# Patient Record
Sex: Male | Born: 1940 | Race: Black or African American | Hispanic: No | Marital: Single | Smoking: Former smoker
Health system: Southern US, Community
[De-identification: ages and names within clinical notes are randomized; demographics above are authoritative.]

## PROBLEM LIST (undated history)

## (undated) DIAGNOSIS — C801 Malignant (primary) neoplasm, unspecified: Secondary | ICD-10-CM

## (undated) DIAGNOSIS — I5020 Unspecified systolic (congestive) heart failure: Secondary | ICD-10-CM

## (undated) DIAGNOSIS — K219 Gastro-esophageal reflux disease without esophagitis: Secondary | ICD-10-CM

## (undated) DIAGNOSIS — J449 Chronic obstructive pulmonary disease, unspecified: Secondary | ICD-10-CM

## (undated) DIAGNOSIS — N400 Enlarged prostate without lower urinary tract symptoms: Secondary | ICD-10-CM

## (undated) DIAGNOSIS — N184 Chronic kidney disease, stage 4 (severe): Secondary | ICD-10-CM

## (undated) HISTORY — PX: COLONOSCOPY: SHX174

## (undated) HISTORY — PX: ABDOMINAL SURGERY: SHX537

---

## 2005-02-14 ENCOUNTER — Ambulatory Visit: Payer: Self-pay | Admitting: Internal Medicine

## 2005-02-14 ENCOUNTER — Inpatient Hospital Stay (HOSPITAL_COMMUNITY): Admission: EM | Admit: 2005-02-14 | Discharge: 2005-03-03 | Payer: Self-pay | Admitting: Emergency Medicine

## 2005-02-20 ENCOUNTER — Encounter (INDEPENDENT_AMBULATORY_CARE_PROVIDER_SITE_OTHER): Payer: Self-pay | Admitting: Specialist

## 2005-02-21 ENCOUNTER — Encounter: Payer: Self-pay | Admitting: Cardiology

## 2005-02-25 ENCOUNTER — Encounter (INDEPENDENT_AMBULATORY_CARE_PROVIDER_SITE_OTHER): Payer: Self-pay | Admitting: Specialist

## 2005-02-28 ENCOUNTER — Ambulatory Visit: Payer: Self-pay | Admitting: Oncology

## 2005-07-18 ENCOUNTER — Inpatient Hospital Stay (HOSPITAL_COMMUNITY): Admission: EM | Admit: 2005-07-18 | Discharge: 2005-07-26 | Payer: Self-pay | Admitting: Emergency Medicine

## 2005-07-25 ENCOUNTER — Encounter (INDEPENDENT_AMBULATORY_CARE_PROVIDER_SITE_OTHER): Payer: Self-pay | Admitting: *Deleted

## 2006-10-07 ENCOUNTER — Ambulatory Visit: Payer: Self-pay | Admitting: Internal Medicine

## 2006-10-07 ENCOUNTER — Inpatient Hospital Stay (HOSPITAL_COMMUNITY): Admission: EM | Admit: 2006-10-07 | Discharge: 2006-10-17 | Payer: Self-pay | Admitting: Emergency Medicine

## 2006-10-07 ENCOUNTER — Ambulatory Visit: Payer: Self-pay | Admitting: Emergency Medicine

## 2006-10-14 ENCOUNTER — Encounter (INDEPENDENT_AMBULATORY_CARE_PROVIDER_SITE_OTHER): Payer: Self-pay | Admitting: Gastroenterology

## 2007-02-08 ENCOUNTER — Ambulatory Visit: Payer: Self-pay | Admitting: Family Medicine

## 2007-02-08 ENCOUNTER — Inpatient Hospital Stay (HOSPITAL_COMMUNITY): Admission: EM | Admit: 2007-02-08 | Discharge: 2007-02-14 | Payer: Self-pay | Admitting: Emergency Medicine

## 2007-02-17 ENCOUNTER — Ambulatory Visit: Payer: Self-pay | Admitting: Family Medicine

## 2007-02-17 DIAGNOSIS — N401 Enlarged prostate with lower urinary tract symptoms: Secondary | ICD-10-CM

## 2007-02-17 DIAGNOSIS — C189 Malignant neoplasm of colon, unspecified: Secondary | ICD-10-CM | POA: Insufficient documentation

## 2007-02-17 DIAGNOSIS — J449 Chronic obstructive pulmonary disease, unspecified: Secondary | ICD-10-CM

## 2007-02-17 DIAGNOSIS — N12 Tubulo-interstitial nephritis, not specified as acute or chronic: Secondary | ICD-10-CM | POA: Insufficient documentation

## 2007-07-20 ENCOUNTER — Ambulatory Visit (HOSPITAL_BASED_OUTPATIENT_CLINIC_OR_DEPARTMENT_OTHER): Admission: RE | Admit: 2007-07-20 | Discharge: 2007-07-20 | Payer: Self-pay | Admitting: Urology

## 2008-03-29 ENCOUNTER — Ambulatory Visit: Payer: Self-pay | Admitting: Family Medicine

## 2009-04-10 ENCOUNTER — Telehealth: Payer: Self-pay | Admitting: *Deleted

## 2010-01-23 ENCOUNTER — Encounter: Payer: Self-pay | Admitting: Family Medicine

## 2010-01-23 DIAGNOSIS — N1330 Unspecified hydronephrosis: Secondary | ICD-10-CM | POA: Insufficient documentation

## 2010-04-16 NOTE — Progress Notes (Signed)
Summary: refill  Phone Note Refill Request Call back at [phone] Message from:  Patient  Refills Requested: Medication #1:  OMEPRAZOLE 40 MG  CPDR 1 by mouth once daily. [facility]  Initial call taken by: De Nurse,  April 10, 2009 11:03 AM  Follow-up for Phone Call       Follow-up by: Golden Circle RN,  April 10, 2009 11:18 AM    Prescriptions: OMEPRAZOLE 40 MG  CPDR (OMEPRAZOLE) 1 by mouth once daily  #30 x 12   Entered by:   Golden Circle RN   Authorized by:   Denny Levy MD   Signed by:   Golden Circle RN on 04/10/2009   Method used:   Electronically to        [facility] (retail)       [address]       [place], [place]  [id number]       Ph: [phone]       Fax: [phone]   RxID:   [id number]

## 2010-04-16 NOTE — Miscellaneous (Signed)
  Clinical Lists Changes  Problems: Removed problem of HYDROURETER/ HYDRONEPHROSIS (ICD-239.4) Added new problem of HYDRONEPHROSIS (ICD-591)

## 2010-06-26 ENCOUNTER — Ambulatory Visit: Payer: Self-pay | Admitting: Family Medicine

## 2010-07-30 NOTE — Consult Note (Signed)
[name], [name] NO.:  [id number]   MEDICAL RECORD NO.:  [id number]          PATIENT TYPE:  INP   LOCATION:  5012                         FACILITY:  [facility]   PHYSICIAN:  Graylin Shiver, M.D.   DATE OF BIRTH:  [birth year]   DATE OF CONSULTATION:  10/12/2006  DATE OF DISCHARGE:                                 CONSULTATION   REASON FOR CONSULTATION:  The patient is a 70 year old male who was  admitted to the hospital because of urosepsis.  He has been on  antibiotics and is improving from that.   We are consulted from a GI standpoint by Dr. Concepcion Elk because the patient  has a history of colon cancer which was diagnosed by Dr. Dorena Cookey in  2006.  The patient subsequently underwent a right hemicolectomy by Dr.  Daphine Deutscher in December 2006 and did not require chemotherapy.  According to  the patient, he has not had a follow-up colonoscopy.  When I checked  with our office, he was scheduled for a follow-up but apparently no  showed, and therefore he has not had a follow-up colonoscopy as of yet.  Dr. Concepcion Elk has requested consultation for consideration of follow up at  this time.  The patient is also anemic with a hemoglobin of 11.1 and  hematocrit of 33.8.  The patient denies seeing any blood in the stool.   The patient was in the hospital back in May 2007 at which time he was  endoscoped by Dr. Madilyn Fireman, and he was found to have severe esophagitis.   PAST MEDICAL HISTORY:  1. Ureteral stricture.  2. Benign prostatic hypertrophy.  3. COPD.  4. Gastroesophageal reflux.  5. Renal insufficiency.  6. Iron deficiency anemia.  7. Coronary artery disease.   SURGICAL HISTORY:  1. Right hemicolectomy.  2. Cystoscopy.   ALLERGIES:  None known.   SOCIAL HABITS:  She does not smoke or drink alcohol at this time.   On review of his hospital record from this hospitalization, there was  some initial question of cholecystitis based on CT scan, however this  was not  confirmed by HIDA scan.  Surgery has to follow the patient.   PHYSICAL EXAMINATION:  VITAL SIGNS:  Temperature 97.1, pulse 105, blood  pressure 128/84.  He is in no distress, nonicteric.  NECK:  Supple.  HEART:  Regular rhythm.  No murmurs.  Lungs were clear.  ABDOMEN:  Soft, nontender, no obvious hepatosplenomegaly.   IMPRESSION:  From a GI standpoint at this point, we are asked to see  this patient in regards to follow-up colonoscopy in view of his prior  history of colon cancer.   PLAN:  I will schedule the patient for colonoscopy in a couple of days  after he is prepped.           ______________________________  Graylin Shiver, M.D.     SFG/MEDQ  D:  10/12/2006  T:  10/13/2006  Job:  [id number]   cc:   Fleet Contras, M.D.  John C. Madilyn Fireman, M.D.

## 2010-07-30 NOTE — Consult Note (Signed)
[name], [name] NO.:  [id number]   MEDICAL RECORD NO.:  [id number]          PATIENT TYPE:  INP   LOCATION:  2114                         FACILITY:  [facility]   PHYSICIAN:  Lindaann Slough, M.D.  DATE OF BIRTH:  1940/04/19   DATE OF CONSULTATION:  10/08/2006  DATE OF DISCHARGE:                                 CONSULTATION   REASON FOR CONSULTATION:  Urosepsis.   The patient is a 70 year old male with a history of colon cancer,  treated with colectomy in 2006.  He also has an indwelling Foley  catheter for urethral stricture.  The patient's catheter was last  replaced in May 2008.  He has missed several appointments for catheter  exchange.  He had been having increasing fatigue.  He has been having  vomiting and diarrhea on and off for about 2-3 weeks.  His symptoms  became progressively worse, and during the past 2 days he has been  unable to keep anything down.  He came to the emergency room and was  hypotensive and had fever and chills.  He is known to have bilateral  hydronephrosis, right worse than left.  Renal ultrasound shows bilateral  hydronephrosis, right worse than left.  Creatinine was 7.7 on October 07, 2006.  The Foley catheter was replaced, and urinary output has since  increased.  Repeat creatinine today at 2:00 was 5.92.  Abdominal  ultrasound showed a stone at the gallbladder neck.  CT scan showed acute  cholecystitis.   PAST MEDICAL HISTORY:  1. Colon cancer.  2. He is known to have a urethral stricture, with bilateral      hydronephrosis.  3. COPD.  4. Chronic renal insufficiency.   MEDICATIONS:  Albuterol, ferrous sulfate.   SOCIAL HISTORY:  He does not smoke or drink.   FAMILY HISTORY:  Noncontributory.   ALLERGIES:  NO KNOWN DRUG ALLERGIES.   REVIEW OF SYSTEMS:  As noted in the HPI, and everything else negative.   PHYSICAL EXAMINATION:  GENERAL:  This is a cachectic 70 year old male.  He is alert and oriented.  VITAL SIGNS:   Blood pressure is he is 96/56, temperature 99.5, pulse  127, respirations 29.  ABDOMEN:  Soft.  It is tender in the right upper quadrant.  He has no  CVA tenderness.  Kidneys are not palpable.  He has no hepatomegaly, no  splenomegaly.  Bladder is not distended.  GU:  Penis is normal.  He has a Foley catheter that is draining mostly  clear urine at this time, but apparently purulent  when he first came  in.  Scrotal contents are within normal limits.  He has no inguinal  hernia, and he has bilateral nontender inguinal adenopathy.   Hemoglobin is 9.5, hematocrit 28.3, and WBC 42.9.  Creatinine is now  5.92, and BUN is 65.  Blood cultures showed gram-negative rods. As  previously noted, CT scan showed acute cholecystitis.   IMPRESSION:  1. Urethral stricture.  2. Sepsis.  3. Renal insufficiency.  4. Bilateral hydronephrosis.  5. Chronic renal failure.  6. History of colon cancer.  7.  Acute cholecystitis.   From a urologic standpoint, the patient will need to leave the Foley  catheter indwelling.  Will check BUN and creatinine.  Management of the  acute cholecystitis as per general surgery.      Lindaann Slough, M.D.  Electronically Signed     MN/MEDQ  D:  10/08/2006  T:  10/09/2006  Job:  [id number]

## 2010-07-30 NOTE — Op Note (Signed)
[name], ROSENDAHL NO.:  [id number]   MEDICAL RECORD NO.:  [id number]          PATIENT TYPE:  INP   LOCATION:  5012                         FACILITY:  [facility]   PHYSICIAN:  Graylin Shiver, M.D.   DATE OF BIRTH:  [birth year]   DATE OF PROCEDURE:  10/14/2006  DATE OF DISCHARGE:                               OPERATIVE REPORT   INDICATION FOR PROCEDURE:  History of colon cancer status post right  hemicolectomy in the past, heme-positive stool, anemia.   Informed consent was obtained after explanation of the risks of  bleeding, infection and perforation.   PREMEDICATION:  Fentanyl 75 mcg IV, Versed 7.5 mg IV.   PROCEDURE:  With the patient in the left lateral decubitus position, a  rectal exam was performed.  No masses were felt.  The Pentax pediatric  colonoscope was inserted into the rectum and advanced around the colon  to the anastomosis site.  The anastomosis site looked normal.  Just near  the anastomosis site there were 3 small mucosal polyps that were  biopsied off with cold forceps.  In the transverse colon there was a  small polyp biopsied off with cold forceps.  The descending colon,  sigmoid and rectum looked normal.  He tolerated the procedure well  without complications.   IMPRESSION:  Colon polyps.   PLAN:  I would recommend checking the biopsies.  I would recommend that  given his history that he should have a repeat colonoscopy again in 3  years.           ______________________________  Graylin Shiver, M.D.     SFG/MEDQ  D:  10/14/2006  T:  10/15/2006  Job:  [id number]   cc:   Fleet Contras, M.D.  John C. Madilyn Fireman, M.D.

## 2010-07-30 NOTE — H&P (Signed)
NAMEVERSHAWN, WESTRUP NO.:  [id number]   MEDICAL RECORD NO.:  [id number]          PATIENT TYPE:  INP   LOCATION:  6295                         FACILITY:  [facility]   PHYSICIAN:  Nestor Ramp, MD        DATE OF BIRTH:  04-12-1940   DATE OF ADMISSION:  02/08/2007  DATE OF DISCHARGE:                              HISTORY & PHYSICAL   PRIMARY CARE PHYSICIAN:  Fleet Contras, M.D.   UROLOGY:  Lindaann Slough, M.D.   GASTROENTEROLOGIST:  Everardo All. Madilyn Fireman, M.D.   HISTORY OF PRESENT ILLNESS:  This patient is a 70 year old male with  obstructive uropathy, a chronic cath, a history of colon cancer, and  COPD who presented to the ED with a 1-day history of progressive  malaise, decreased appetite, subjective fever and chills.  The patient  noticed a decrease in urine output about 3 days ago.  He has also has  had abdominal pain and suprapubic pain that were similar as with his  prior admission for urosepsis.  He also has had continuous vomiting  since last night.  The most recent episode had coffee-grounds emesis.  The patient took 650 mg of aspirin yesterday, but otherwise has no  chronic NSAID or alcohol use.  He denies syncope, diarrhea, chest pain,  os shortness of breath.  The patient's Foley was changed in the ED, and  he had foul-smelling purulent urine.  His urine is now clear.   PAST MEDICAL HISTORY:  1. Colon cancer status post hemicolectomy in 2006.  The patient had a      a colonoscopy in July 2008 which showed three polyps.  He did not      follow up with Dr. Madilyn Fireman.  2. BPH with a hydroureter and hydronephrosis.  3. COPD.  4. GERD.  5. Chronic renal insufficiency with a baseline creatinine of 1.9-2.0.   MEDICATIONS:  The patient really does not take any medicines  consistently.   ALLERGIES:  No known drug allergies.   SOCIAL HISTORY:  The patient lives in a boarding house.  He has no  family in the area, and seems to be estranged from them.  He quit  smoking  in 2006 after a 30-year history.  He uses occasional alcohol.  Last drink was 3 weeks ago.  He does not do any drugs.  He has trouble  affording his medicines.   FAMILY HISTORY:  The patient states everyone in his family died of  natural causes.   PHYSICAL EXAM:  VITAL SIGNS:  Temperature 97.5, pulse 111, BP 146/88,  respirations 24, O2 97% on room air.  GENERAL:  The patient is alert and oriented x3.  No acute distress.  HEENT:  Head is normocephalic and atraumatic.  He wears glasses.  Dry  mucous membranes.  NECK:  Supple.  No carotid bruits.  No JVD.  CARDIOVASCULAR:  He is tachycardic.  No murmurs, rubs or gallops.  LUNGS:  Clear to auscultation bilaterally.  ABDOMEN: Positive bowel sounds.  Mild suprapubic tenderness.  No CVA  tenderness.  EXTREMITIES:  No clubbing, cyanosis or edema.  NEURO EXAM:  Cranial nerves II-XII are grossly intact.   LABS AND STUDIES:  Abdominal chest x-ray showed no acute findings.  Gastroccult blood was positive.  UA showed large hemoglobin, greater  than 300 protein, negative nitrites, large LE, specific gravity 1.011.  Urine micro showed too numerous to count white blood cells, 11-20 red  blood cells, many bacteria.  Sodium 140, potassium 4.0, chloride 106,  bicarb 21, BUN 46, creatinine 4.6, glucose 135.  White count 24.3,  hemoglobin 17.3, and i-STAT 13.7 on regular CBC, platelets 312.   ASSESSMENT AND PLAN:  1. This is a 70 year old male with UTI/urosepsis and upper GI bleed.      The patient is currently hemodynamically stable except for mild      tachycardia.  He is afebrile but has a very elevated white count      given his history of urosepsis with similar presentation, last      September, we will start him on Zosyn and monitor him closely.  We      will start antibiotics when the culture is back.  If the patient is      improving we will also check blood cultures.  2. Upper gastrointestinal bleed.  The patient has had gastric occult       positive emesis.  He may have a small Mallory-Weiss tear from      repetitive vomiting.  If he continues to vomit he will need a GI      consult and likely EGD.  We will type and screen him for 2 units.      Hemoglobin currently normal.  Will check coags, give Protonix IV      b.i.d. and Zofran p.r.n. nausea.  3. Dehydration.  This is likely secondary to vomiting and poor intake.      Will bolus him 500 mL now and run maintenance IV fluids.  4. Acute on chronic renal failure.  This is likely post renal with his      history.  He also probably has a component of prerenal  from volume      depletion.  5. History of chronic obstructive pulmonary disease.  There is no      evidence of acute exacerbation.  Will monitor.  6. Prophylaxis.  He will be on SCDs for DVT prophylaxis.      Altamese Cabal, M.D.  Electronically Signed      Nestor Ramp, MD  Electronically Signed    KS/MEDQ  D:  02/08/2007  T:  02/09/2007  Job:  [id number]

## 2010-07-30 NOTE — Consult Note (Signed)
NAMEKEPLER, MCCABE NO.:  [id number]   MEDICAL RECORD NO.:  [id number]          PATIENT TYPE:  INP   LOCATION:  2114                         FACILITY:  [facility]   PHYSICIAN:  Ollen Gross. Vernell Morgans, M.D. DATE OF BIRTH:  29-May-1940   DATE OF CONSULTATION:  10/08/2006  DATE OF DISCHARGE:                                 CONSULTATION   REASON FOR CONSULTATION:  Mr. Starling is a 70 year old black male who  presented to the emergency department yesterday with complaints of  generalized malaise and fatigue and nausea, vomiting and diarrhea that  had been going on for about the last 3-4 weeks.   HISTORY OF PRESENT ILLNESS:  He felt that it started with some bad food  over the July 4th holiday, but has progressed since then.  He has had  difficulty passing his urine.  He initially did not complain of any  chest pain or abdominal pain.  He has not been able to keep food down  for quite some time.  He has felt a little feverish and chills.  He  arrived to the emergency department hypotensive and tachycardic and  required admission to the ICU and aggressive resuscitation with signs of  sepsis.  He, after arrival, was found to have bacteria in the urine with  hydronephrosis.  He has a white count of 20,000.  He also had a CT scan  that showed some thickening of the gallbladder wall with a gallstone, as  well as the hydronephrosis.  He was also found to be in renal failure  with a creatinine of 7.7.  He has been aggressively resuscitated and  seems to be gradually improving.  His only complaint now is of some mild  right upper quadrant epigastric pain.  His nausea is improving.   PAST MEDICAL HISTORY:  1. His past medical history is significant for colon cancer.  2. Urethral stricture.  3. BPH.  4. COPD.  5. Gastroesophageal reflux.  6. Chronic renal insufficiency.  7. Iron deficiency anemia.  8. Coronary artery disease.   PAST SURGICAL HISTORY:  1. Past surgical history is  significant for a colon resection in 2006.  2. Cystoscopy.   MEDICATIONS:  His medications include a multivitamin with iron; and  albuterol.   ALLERGIES:  No known drug allergies.   SOCIAL HISTORY:  He denies any current use of alcohol or tobacco  products.   FAMILY HISTORY:  Family history is noncontributory.   PHYSICAL EXAMINATION:  VITAL SIGNS:  On physical examination, his  temperature is currently 98.8, blood pressure is 111/54, pulse of 115,  O2 saturation 92%.  He is on norepinephrine and vasopressin.  GENERAL APPEARANCE:  In general, he is an elderly, thin black male,  lying in bed, in no acute distress.  SKIN:  The skin is warm and dry with no jaundice.  EYES:  Extraocular muscles are intact.  Pupils are equal, round and  reactive to light.  Sclerae are nonicteric.  LUNGS:  Lungs are clear bilaterally with no use of accessory respiratory  muscles.  HEART:  The heart has a  regular rate and rhythm with an impulse in the  left chest.  ABDOMEN:  The abdomen is soft, with some mild epigastric right upper  quadrant tenderness, but no guarding or peritonitis.  EXTREMITIES:  No clubbing, cyanosis or edema.  He has some general  weakness.  PSYCHOLOGICAL:  He is alert and oriented x3 with no evidence of anxiety  or depression.   LABORATORY INVESTIGATIONS:  On review of his lab work, it was  significant for a white count of 20,000.  His total bilirubin was  elevated at 3.2, but other liver functions were normal.  Creatinine was  down to 5.9 from 7.7.  CT scan, again, showed hydronephrosis and some  thickening of the gallbladder wall with a gallstone.  He had a troponin  of 1.   ASSESSMENT AND PLAN:  This is a 70 year old black male with what sounds  like a presentation of urosepsis.  It is certainly possible that he  could have some cholecystitis contributing to his current condition as  well, given the findings.   At this point I would recommend continued bowel rest and  board-spectrum  antibiotic therapy since he seems to be improving on this.  I would  recommend a urology consultation for his hydronephrosis.  I would also  recommend a cardiology evaluation, given his elevated troponin.  If this  is evidence of an MI, then he would be at extremely high risk of  complication with any type of surgery.  Given his current condition and  findings, I believe that the safest course of action with his  gallbladder would be to percutaneously drain it rather than try to  operate on him at this point in time, and we will continue to follow him  closely with you.      Ollen Gross. Vernell Morgans, M.D.  Electronically Signed     PST/MEDQ  D:  10/08/2006  T:  10/08/2006  Job:  [id number]

## 2010-07-30 NOTE — Discharge Summary (Signed)
[name], HUTCHINSON NO.:  [id number]   MEDICAL RECORD NO.:  [id number]          PATIENT TYPE:  INP   LOCATION:  6712                         FACILITY:  [facility]   PHYSICIAN:  Nestor Ramp, MD        DATE OF BIRTH:  [birth year]   DATE OF ADMISSION:  02/08/2007  DATE OF DISCHARGE:  02/13/2007                               DISCHARGE SUMMARY   PRIMARY CARE Zarina Pe:  The patient is to establish care at [facility].   PRIMARY UROLOGIST:  Lindaann Slough, M.D., [facility].   REASON FOR ADMISSION:  Oliguria, fatigue.   DISCHARGE DIAGNOSES:  1. Pyelonephritis.  2. History of colon cancer, status post hemicolectomy in 2006.  3. Benign prostatic hypertrophy with hydroureter and hydronephrosis.  4. Chronic obstructive pulmonary disease.  5. Gastroesophageal reflux disease.  6. Chronic renal insufficiency with baseline creatinine of 1.9 to 2.0.   DISCHARGE MEDICATIONS:  1. Bactrim Double Strength 1 tablet p.o. b.i.d. times 10 days.  2. Megace 800 mg p.o. daily.  3. Protonix 40 mg p.o. daily.  4. Centrum Silver multivitamin daily.  5. Iron sulfate 325 mg p.o. b.i.d.  6. Albuterol 2 puffs q.6h. p.r.n.   HOSPITAL COURSE:  The patient is a 70 year old male with a history of  obstructive uropathy and chronic Foley catheter who presented with a 1-  day history of progressive malaise, decreased appetite, subjective fever  and chills and a 3-day history of oliguria.   Problem #1.  Sepsis. Upon evaluation the patient was found to have a  white blood cell count of 24.3, tachycardia to 111.  Given his  urinalysis findings, the patient was started on Zosyn for sepsis  secondary to UTI/pyelonephritis.  The patient subsequently has  improvement in his white blood cell count and tachycardia prior to  discharge.  He was never hemodynamically unstable.  Had a relatively  uneventful hospital course.  Problem #2.  Pyelonephritis.  The patient has a history of  chronic  indwelling Foley secondary to both BPH and anatomical abnormalities.  He  had 1 similar episode of obstruction of his catheter and subsequent  development of sepsis and pyelonephritis in September.  When the patient  was evaluated in the ED and his Foley catheter was changed, it was  significant for gross pus that was expressed from his urethra.  The  patient was started on Zosyn while urine culture was pending.  Once  urine culture came back with Escherichia coli, pan sensitive, he was  transitioned to Bactrim, Double Strength 1 tablet p.o. b.i.d.  Given his  classification of a complicated pyelonephritis he completed a 2-week  course of antibiotics.  The patient since this is the second episode of  this happening, would strongly recommend that the patient discuss either  more frequent changing of his Foley catheter or other options with Dr.  Brunilda Payor, his urologist.  Problem #3.  Upper GI bleed.  When the patient presented he had episode  of emesis which was gastro-occult positive.  The likely etiology was  small Mallory-Weiss tear secondary to repetitive vomiting.  After he was  admitted to the floor he did not have any further episodes of vomiting  and his hemoglobin remained stable throughout the duration of his  hospital stay.  He will be discharged on Protonix 40 mg p.o. daily.  Problem #4.  Acute on-chronic renal failure. The patient had a baseline  renal creatinine of 1.922.  On evaluation in the ED he was found to have  a creatinine of 4.6.  This is likely secondary to post-renal cause with  obstruction with his blocked catheter.  His creatinine greatly improved  throughout the course of his hospital stay trending each day towards his  baseline.  This was after administration of IV fluids.  On the day prior  to discharge his creatinine was 2.37.   CONDITION AT DISCHARGE:  Stable.   Discharge labs white blood cell count 7.4, hemoglobin 10.9, platelets  194, urine culture  positive for Escherichia coli which was pan  sensitive, blood cultures times 2 were no growth to date.   DISPOSITION:  The patient is discharged to home.   DISCHARGE FOLLOWUP:  1. The patient is to follow up with Dr. Lindaann Slough of urology on      March 04, 2007, at 1 p.m.  2. The patient is follow up with Dr. Jennette Kettle at [facility] on February 17, 2007, 9:15 a.m.   FOLLOW-UP ISSUES:  1. Given 2 episodes of pyelonephritis secondary to blocked Foley      catheter, the patient may need new options.  2. Anemia.  3. Poor appetite.      Lauro Franklin, MD  Electronically Signed      Nestor Ramp, MD  Electronically Signed    TCB/MEDQ  D:  02/12/2007  T:  02/12/2007  Job:  [id number]

## 2010-07-30 NOTE — Op Note (Signed)
NAMENEIKO, TRIVEDI NO.:  [id number]   MEDICAL RECORD NO.:  [id number]           PATIENT TYPE:   LOCATION:                                 FACILITY:   PHYSICIAN:  Lindaann Slough, M.D.  DATE OF BIRTH:  12/03/1940   DATE OF PROCEDURE:  07/20/2007  DATE OF DISCHARGE:                               OPERATIVE REPORT   PREOPERATIVE DIAGNOSIS:  Urethral stricture.   POSTOPERATIVE DIAGNOSIS:  Urethral stricture.   PROCEDURE PERFORMED:  Cystoscopy, Lowsley insertion of suprapubic tube  via cystostomy.   SURGEON:  Danae Chen, M.D.   ASSISTANT:  Melina Schools   ANESTHESIA:  General LMA.   DRAINS:  22-French suprapubic tube.   INDICATIONS FOR PROCEDURE:  Mr. Cada is a 70 year old male with urethral  stricture.  He has a history of medical noncompliance.  He has failed  conservative management and has been on an indwelling urethral catheter.  He desires a suprapubic tube.   DESCRIPTION OF PROCEDURE:  The patient was brought to the operating  room.  He was identified by his armband.  Informed consent was obtained  and preoperative time-out was performed.  After the successful induction  of general endotracheal anesthesia, this patient was moved to the dorsal  lithotomy position where all appropriate pressure points were padded to  avoid neurapraxia and compartment syndrome.  The urethral catheter was  removed.  The perineum was prepped and draped.  The surgeons were gowned  and gloved.  The 22-French cystoscopic sheath was used to introduce the  30 degrees cystoscopic lens transurethrally.  The anterior urethra  showed an approximately 2 cm area of dense stricture that was opened  from the previous catheter.  Posterior urethra showed a TUR defect.  Upon entering the bladder it was small and contracted.  There were no  masses, foreign bodies, diverticula, stones.  The bladder was then  filled.  The cystoscope was removed.  The curved Lowsley retractor was  passed  blindly per urethra until it could be palpated on the anterior  abdominal wall within the bladder.  Scalpel was used to cut down onto  the retractor which was then brought out percutaneously.  A 22-French  Foley catheter was secured to the Lowsley with a silk stitch.  This was  then drawn in an antegrade fashion until the tip of the catheter was at  the urethral meatus.  The retractor was then removed.  The Foley  catheter was then retracted into the bladder under direct vision with  the cystoscope.  Once seen there the balloon was inflated.  We  visualized the balloon flush against the wall of the bladder.  The  cystoscope was removed.  A silk stitch was placed to hold the SP tube in  place.  At this time the procedure was terminated.  The patient  tolerated the procedure well and there were no  complications.  Su Grand was the attending primary responsible  physician who was present and participated in all aspects of the  procedure.   DISPOSITION:  To the PACU.     ______________________________  Melina Schools, M.D.      Lindaann Slough, M.D.  Electronically Signed    JR/MEDQ  D:  07/20/2007  T:  07/20/2007  Job:  [id number]

## 2010-07-30 NOTE — Discharge Summary (Signed)
[name], [name] NO.:  [id number]   MEDICAL RECORD NO.:  [id number]          PATIENT TYPE:  INP   LOCATION:  6712                         FACILITY:  [facility]   PHYSICIAN:  Nestor Ramp, MD        DATE OF BIRTH:  [birth year]   DATE OF ADMISSION:  02/08/2007  DATE OF DISCHARGE:  02/14/2007                               DISCHARGE SUMMARY   ADDENDUM:  The patient was kept overnight because he had developed  diarrhea which improved throughout the day, and his symptoms had  resolved by the next morning.  He is tolerating p.o. well and had no  further symptoms.   DISCHARGE MEDICATIONS:  Remain the same.   Followup plans remain the same.      Altamese Cabal, M.D.  Electronically Signed      Nestor Ramp, MD  Electronically Signed    KS/MEDQ  D:  02/14/2007  T:  02/14/2007  Job:  [id number]

## 2010-08-02 NOTE — Consult Note (Signed)
[name], [name] NO.:  [id number]   MEDICAL RECORD NO.:  [id number]          PATIENT TYPE:  INP   LOCATION:  5511                         FACILITY:  [facility]   PHYSICIAN:  Willa Rough, M.D.     DATE OF BIRTH:  [birth year]   DATE OF CONSULTATION:  DATE OF DISCHARGE:                                   CONSULTATION   [name] presented with dehydration and renal insufficiency.  He had urinary  problems and eventually was found to have a right hepatic colon mass.  He  needs surgery for this.  It was noted that he had an increased troponin when  he was admitted, and his EKG is abnormal.  Cardiology is asked to see him  for further evaluation.   The patient denies any cardiac symptoms.  He has not had chest pain or  significant shortness of breath.  He has not had syncope or presyncope.   EKG reveals decreased anterior R wave progression.   ALLERGIES:  No known drug allergies.   MEDICATIONS:  He was on albuterol at the time of admission.   SOCIAL HISTORY:  Patient smokes two packs per day.   FAMILY HISTORY:  No strong family history of coronary disease.   REVIEW OF SYSTEMS:  When the patient was first admitted, he had a poor  appetite and was feeling poorly in general.  Today he is feeling much  better.  He is not having any GI or GU symptoms.  Review of systems is  otherwise negative at this time.   PHYSICAL EXAMINATION:  GENERAL:  The patient appears cachectic in general.  VITAL SIGNS:  Systolic blood pressure is 94 with a diastolic of 69.  His  heart rate is 90.  Respirations are 20.  HEENT:  No xanthelasma.  He has normal extraocular motions.  He has poor  dentition.  NECK:  There are no carotid bruits.  There is no jugular venous distention.  CARDIAC:  An S1 and S2.  There are no clicks or significant murmurs.  LUNGS:  Distant lung sounds.  There are no obvious rales heard.  ABDOMEN:  Soft with no bruits heard.  He has no significant peripheral   edema.   EKG:  The EKG reveals decreased anterior R wave progression.  An old septal  infarct cannot be ruled out.   BUN has improved to 7 with creatinine 1.4.  Hemoglobin currently is 9.1.  TSH is normal.  There is one CPK value of 80 with one troponin of 0.37, and  this was done close to the time of admission.   PROBLEMS:  1.  Hydronephrosis with the need for surgery for a colon mass.  2.  Troponin of 0.37.  3.  Abnormal electrocardiogram.   At this point, it is not clear if the patient has significant cardiac  disease or not.  We will obtain a 2D echo as soon as possible to assess LV  function.  If he has no wall motion abnormalities, I feel that surgery can  be undertaken.  If he has wall motion abnormalities, he  will need more  complete workup, including a screening nuclear scan.           ______________________________  Willa Rough, M.D.     JK/MEDQ  D:  02/20/2005  T:  02/20/2005  Job:  [id number]   cc:   Fleet Contras, M.D.  Fax: [phone]

## 2010-08-02 NOTE — Consult Note (Signed)
[name], WHITWELL NO.:  [id number]   MEDICAL RECORD NO.:  [id number]          PATIENT TYPE:  INP   LOCATION:  4711                         FACILITY:  [facility]   PHYSICIAN:  Lindaann Slough, M.D.  DATE OF BIRTH:  1940-04-14   DATE OF CONSULTATION:  07/18/2005  DATE OF DISCHARGE:                                   CONSULTATION   REASON FOR CONSULTATION:  Inability to insert Foley catheter.   The patient is a 70 year old male who is admitted with dehydration, renal  insufficiency, diarrhea for five days.  The patient has a history of  urethral stricture and had urethral dilation in December 2006.  Cystoscopy  in January 2007, showed a heavy trabeculated bladder without evidence of  bladder stone or tumor.  He also had a right hemicolectomy, in December  2006, for colon cancer.  The patient states that he had been voiding fairly  well, however he had hesitancy and straining with urination at times.  For  the past several days he has not been feeling well, he has poor appetite and  has been feeling weak, and has been having diarrhea for the past five days,  and today started throwing up so he came to the emergency room.  Hemoglobin  is 8.6, hematocrit 27.8 and WBC 14.3 with 86% neutrophils.  BUN is 116,  creatinine 3.1, potassium 3.7, sodium 132.  The patient was scheduled for  follow up appointment in my office a month ago, however he did not keep the  appointment.  He denies any gross hematuria or dysuria.   PAST MEDICAL HISTORY:  Positive for COPD, colon cancer and urethral  stricture, and he had colon resection in December 2006 for colon cancer.   ALLERGIES:  HE HAS NO KNOWN DRUG ALLERGIES.   SOCIAL HISTORY:  He has smoked about a pack a day for several years, and he  is married, and he is a retired Engineer, site, and he does not drink.   FAMILY HISTORY:  Noncontributory.   REVIEW OF SYSTEMS:  He has been having diarrhea for the past five days, and  he  feels weak.  He has a poor appetite.  He also has shortness of breath  and, according to him, everything else is negative.   PHYSICAL EXAMINATION:  This is a cachectic 70 year old male who feels weak  and who appears dehydrated.  Blood pressure is 108/68, pulse 80,  respirations 20, temperature 94.1.  He has pale conjunctivae.  Lungs are  clear.  Heart is regular rhythm.  Abdomen is soft and nontender.  He has  some tenderness in the suprapubic area.  I cannot palpate his bladder.  He  has no inguinal hernia.  Bowel sounds are normal.  Penis is uncircumcised  and meatus is normal.  Scrotum is normal.  He has no hydrocele, no  testicular mass.  Cord and epididymitis are within normal limits.  On rectal  examination sphincter tone is normal.  I do not feel a rectal mass.   I passed the cystoscope in the urethra, and I could not pass the scope  in  the bladder.  I passed a guidewire through the cystoscope, and I was able to  dilate the urethra with Haymann's dilators up to a number 20 [place], then  passed a #16 catheter over the guidewire into the bladder.  At first I could  not get any urine out of the catheter.  I then passed the guidewire through  the catheter and removed the catheter and passed the cystoscope into the  bladder.  I could not clearly visualize the mucosa because there was a lot  of cloudy material in the bladder.  I then removed the cystoscope and passed  a #16 Councill catheter over the guidewire, and then I irrigated the bladder  and drained some foul smelling material out of the bladder.  The catheter  was left to straight drainage, then obtained 800 cc of very cloudy urine.   IMPRESSION:  1.  Urethral stricture.  2.  History of colon cancer.  3.  Renal insufficiency.  4.  Anemia.  5.  Urinary tract infection.  6.  Chronic obstructive pulmonary disease.   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   The plan is to obtain cystogram and get a renal ultrasound.   He also needs  general surgery consultation.      Lindaann Slough, M.D.  Electronically Signed     MN/MEDQ  D:  07/18/2005  T:  07/20/2005  Job:  [id number]   cc:   Fleet Contras, M.D.  Fax: [phone]

## 2010-08-02 NOTE — Consult Note (Signed)
[name], [name] NO.:  [id number]   MEDICAL RECORD NO.:  [id number]          PATIENT TYPE:  INP   LOCATION:  5511                         FACILITY:  [facility]   PHYSICIAN:  John C. Madilyn Fireman, M.D.    DATE OF BIRTH:  [birth year]   DATE OF CONSULTATION:  DATE OF DISCHARGE:                                   CONSULTATION   REASON FOR CONSULTATION:  Iron deficiency anemia.   HISTORY OF ILLNESS:  The patient is a 70 year old white male admitted with  acute renal failure, dehydration, and electrolyte disarray, possible  bronchitis, possible UTI, who has responded very well to hydration and  antibiotics.  He had a hemoglobin of 9.5 on admission, but this dropped to  6.5 with hydration requiring transfusion.  His initial MCV was 63.9 with a  platelet count of 987,000.  He has had one stool checked for guaiac, and  this was heme negative.  His B12 and folate have been normal, but his iron  was less than 10.  He denies any GI symptoms whatsoever.  He has had no  prior colon screening.   PAST MEDICAL HISTORY:  Essentially unremarkable prior to this admission.  He  just recently had been diagnosed with COPD and started on Albuterol.   SURGERIES:  None.   ALLERGIES:  NONE.   SOCIAL HISTORY:  The patient is married, he smokes two packs of cigarettes a  day, denies alcohol use.   FAMILY HISTORY:  Negative for GI malignancy.   PHYSICAL EXAMINATION:  GENERAL:  Slight build, black male in no acute  distress.  HEART:  Regular rate and rhythm without murmur.  LUNGS:  Clear.  ABDOMEN:  Soft, nondistended with normoactive bowel sounds.  No  hepatosplenomegaly, masses, or guarding.   IMPRESSION:  Iron deficiency anemia in a 70 year old patient with no prior  colon screen.   PLAN:  We will proceed with colonoscopy tomorrow.  Risk, rationale, and  alternatives were explained to the patient and wished to proceed.           ______________________________  [name],  M.D.    JCH/MEDQ  D:  02/19/2005  T:  02/19/2005  Job:  [id number]   cc:   Fleet Contras, M.D.  Fax: [phone]

## 2010-08-02 NOTE — Op Note (Signed)
NAMESHALOM, MCGUINESS NO.:  [id number]   MEDICAL RECORD NO.:  [id number]          PATIENT TYPE:  INP   LOCATION:  5511                         FACILITY:  [facility]   PHYSICIAN:  Thornton Park. Daphine Deutscher, MD  DATE OF BIRTH:  1940-06-22   DATE OF PROCEDURE:  02/25/2005  DATE OF DISCHARGE:                                 OPERATIVE REPORT   PREOPERATIVE DIAGNOSIS:  Carcinoma of the right colon.   POSTOPERATIVE DIAGNOSIS:  Carcinoma of the proximal transverse colon.   PROCEDURE:  Right hemicolectomy with sentinel lymph node biopsy using  Lymphazurin blue.   SURGEON:  Thornton Park. Daphine Deutscher, M.D.   ASSISTANT:  None.   ANESTHESIA:  General endotracheal.   DESCRIPTION OF PROCEDURE:  This 70 year old man was taken to room 17 on  February 25, 2005 and given general anesthesia. The abdomen was prepped with  Betadine and draped sterilely. A midline incision was made and the abdomen  was entered without difficulty. The right colon was mobilized and palpated  and I could feel a plaque-like mass about 2-3 cm in greatest dimension in  the proximal transverse colon just outside the pack flexure. I used a  harmonic scalpel to mobilize the right colon from its attachments and bring  up into the small midline incision. In the area I could palpate the tumor, I  injected approximately 2 cc of Lymphazurin blue and allowed this to migrate.  I also put a marking suture over the cancer. I then checked the mesentery  and found a node that was actually very deep and more towards the ileocolic  region. I dissected this out separately using a harmonic scalpel and sent it  as a sentinel lymph node.   I then divided the bowel proximally and distally with a GIA, going through  the terminal ileum and the transverse colon well beyond the lesion. I went  to the mesentery with the Harmonic scalpel, mapped out the vessels and  divided them using Kelly clamps, the harmonic scalpel and 2-0 silk ties on  the  stained side.   I then aligned the bowel on the antimesenteric borders and opened in a very  sterile packed off fashion where there was no spillage. I painted the inside  of the bowel with some Betadine and fired the stapler after using the  compression. There was no bleeding noted on the inside. The common defect  was closed in two layers with 4-0 PDS and 3-0 silk pops and eventually with  Tisseel. A crotch stitch was placed and the mesentery was closed with  interrupted 3-0 silks. I then changed my gloves and irrigated with saline. I  checked the specimen to confirm the presence of the lesion. The small bowel  was in good position in place and there was no evidence of a metastatic  disease to the liver. The stomach was  fine and no other abnormalities were  noted. The abdominal contents were  then returned to the abdomen. The fascia was injected with 0.5% Marcaine.  The wound was closed with #1 PDS from above and below and tied in  the  middle. I then irrigated the wound with Betadine and with saline and closed  with staples. The patient tolerated the procedure well and was taken to the  recovery room in satisfactory condition.      Thornton Park Daphine Deutscher, MD  Electronically Signed     MBM/MEDQ  D:  02/25/2005  T:  02/26/2005  Job:  [id number]   cc:   CCS   Willa Rough, M.D.  [address]  [address]  [place]  [place] [address]   Everardo All. Madilyn Fireman, M.D.  Fax: [phone]   Fleet Contras, M.D.  Fax: [phone]   Lindaann Slough, M.D.  Fax: [phone]

## 2010-08-02 NOTE — Op Note (Signed)
[name], [name]               ACCOUNT NO.:  [id number]   MEDICAL RECORD NO.:  [id number]          PATIENT TYPE:  INP   LOCATION:  4711                         FACILITY:  [facility]   PHYSICIAN:  John C. Madilyn Fireman, M.D.    DATE OF BIRTH:  1940-07-09   DATE OF PROCEDURE:  07/25/2005  DATE OF DISCHARGE:                                 OPERATIVE REPORT   PROCEDURE:  Esophagogastroduodenoscopy.   INDICATIONS FOR PROCEDURE:  Weight loss, anorexia and anemia.   DESCRIPTION OF PROCEDURE:  The patient was placed in the left lateral  decubitus position and placed on the pulse monitor with continuous low flow  oxygen delivered by nasal cannula.  He was sedated with 50 mcg IV fentanyl  and 5 mg IV Versed. The Olympus video endoscope was advanced under direct  vision into the oropharynx and esophagus.  The esophagus was diffusely  inflamed with long serpiginous erosions that were in some places confluent  with overlying exudate.  This extended down to the GE junction where the  squamocolumnar line was relatively sharply outlined at about 38 cm. Overall,  the appearance was most consistent with reflux esophagitis but I could not  rule out a component of Candida esophagitis particularly in the proximal  esophagus where there were more organized areas of more rounded organized  areas of whitish exudate. Brushings were obtained to rule out Candida.  There was no visible suspicion of neoplasm.  No discrete or deep ulcer seen.  The stomach was entered and a small amount of liquid secretions were  suctioned from the fundus.  Retroflexed view of cardia revealed no definite  hiatal hernia, it did show slight nodularity of the rugal folds but nothing  suspicious for neoplasm.  The fundus and proximal body had a similar  appearance.  The antrum appeared relatively normal with mild patchy erythema  and granularity. A CLO-test was obtained.  The duodenum was entered and the  bulb appeared normal.  The valvulae of  the second portion of the duodenum  appeared slightly atrophic and slightly granular. Biopsies were taken to  rule out any sprue like lesion.  The scope was then withdrawn and the  patient returned to the recovery room in stable condition.  He tolerated the  procedure well.  There were no immediate complications.   IMPRESSION:  1.  Severe esophagitis most consistent with reflux versus possible      Candidiasis.  2.  Moderate nonspecific appearance of gastritis with mild post bulbar      duodenitis.   PLAN:  Await all biopsy results and will treat with a double dose proton  pump inhibitor.           ______________________________  Everardo All. Madilyn Fireman, M.D.     JCH/MEDQ  D:  07/25/2005  T:  07/25/2005  Job:  [id number]   cc:   Fleet Contras, M.D.  Fax: [phone]

## 2010-08-02 NOTE — Discharge Summary (Signed)
Sean, [name] NO.:  [id number]   MEDICAL RECORD NO.:  [id number]          PATIENT TYPE:  INP   LOCATION:  5511                         FACILITY:  [facility]   PHYSICIAN:  Fleet Contras, M.D.    DATE OF BIRTH:  05-02-1940   DATE OF ADMISSION:  02/14/2005  DATE OF DISCHARGE:  03/03/2005                                 DISCHARGE SUMMARY   ADMISSION DIAGNOSIS:  1.  Dehydration.  2.  Acute renal failure.  3.  Acute exacerbation of chronic obstructive pulmonary disease.  4.  Cachexia.  5.  Anemia.   DISCHARGE DIAGNOSIS:  1.  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  2.  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  3.  Cancer of the right colon.  4.  Cachexia.  5.  Iron deficiency anemia.   PRESENTATION:  [name] is a 70 year old African American gentleman with no  significant past medical problems who was seen in my office about a week  prior to presentation with complaints of feeling unwell, poor appetite, poor  oral intake, and shortness of breath.  He had no vomiting, abdominal pain,  or diarrhea, had no blood in the stools.  He denied any fever or chills, but  had lost about 10 pounds in one month.  He had no urinary symptoms.  He was  found to have some rhonchi and wheezes in his lungs, has been smoking  tobacco heavily, about two packs per day.  He was treated initially with  oral antibiotics as well as nebulized bronchodilators as an outpatient but  his condition is not improved and he was, therefore, hospitalized.   HOSPITAL COURSE:  On admission, the patient was noted to be dehydrated,  cachexic, pale, but not icteric or cyanotic, he was tachycardic, and his  abdomen showed tender specific region with palpable bladder.  Rectal exam  showed melanotic stools in the rectum with enlarged soft prostate.  His  initial laboratory data showed white count 16.2, hemoglobin 8.4, hematocrit  26.6, platelet count 456.  Sodium 127,  potassium 6.2, chloride 101,  bicarbonate 23, BUN 149, creatinine 5.1, glucose 112.  Liver function tests  were essentially normal except for albumin low at 2.2.  Chest x-ray showed  changes of COPD and EKG showed normal sinus rhythm with no acute ST-T wave  changes and no peaked T-waves.  He was started on intravenous fluids.  Attempt at Foley catheterization was unsuccessful by the nursing staff and a  urology consult was requested.  The patient was seen by Dr. Su Grand who  found that the patient had urethral stricture.  He was able to place a #16  Counsel tip catheter which immediately drained about 700 mL of cloudy urine.  [name] was also started on intravenous antibiotics with Rocephin and  Zithromax, as well as nebulized bronchodilators.  Anemia profile revealed  iron deficiency and his hemoglobin dropped down to 6.5.  He required two  units of packed red blood cell transfusion.  A gastroenterology consult was  requested.  The patient underwent colonoscopy which showed a right hepatic  flexure mass likely carcinoma, biopsies were obtained.  Biopsies of this  lesion returned suggesting adenocarcinoma.  A surgical consult was requested  and the patient was to have a right hemicolectomy, however, due to his  abnormal EKG findings, a cardiology consult was requested and the patient  was seen by Dr. Myrtis Ser.  The patient underwent an echocardiogram which  revealed slightly reduced LV systolic function at 40%.  He, therefore,  suggested the patient to have a cardiac catheterization performed and this  showed minimal non-obstructive coronary artery disease with ejection  fraction between 45-50%.  She was, therefore, started on low dose beta  blockers prior to surgery.  On February 25, 2005, [name] underwent a right  hemicolectomy with sentinel node biopsy performed by Dr. Daphine Deutscher.  He had a  very satisfactory postoperative recovery.  Pathology of the specimen  obtained was positive for  adenocarcinoma with complete resection and no  invasion of the edges of the mass.  Sentinel node was also negative.  An  oncology consult was requested to ascertain that the patient did not require  any adjuvant therapy and Dr. Cyndie Chime confirmed that the patient did not  need any chemotherapy with adequate cure rates with surgery.  The patient's  condition began to improve postoperatively.  He was able to tolerate oral  intake.  He did have some episodes of loose bowel movements but his stools  for Clostridium difficile toxin was negative.  Today, he is tolerating a  full oral diet, he has no nausea, no abdominal pain, no vomiting.  He is  afebrile, his vital signs are stable.  His chest is clear to auscultation.  Heart sounds 1 and 2 are heard, no murmurs.  Abdomen is soft, nontender, his  wound is clean and dry, staples are in place, bowel sounds are present.  Extremities show no edema, calf tenderness, or swelling.  CNS is alert and  oriented x 3 with no focal neurological deficit.   ASSESSMENT:  [name] is a 70 year old African American gentleman who was  admitted with dehydration and acute renal failure and obstructive uropathy  who was found to have iron deficiency anemia.  A gastroenterology consult  was requested showing a right colonic tumor.  This biopsy revealed  adenocarcinoma.  He has now undergone a right hemicolectomy with  satisfactory postop recovery.  He is considered stable for discharge home  today.   DISCHARGE MEDICATIONS:  Ambien 10 mg 1 q.h.s. p.r.n. sleep, Vicodin 5/500, 1-  2 tablets p.o. q.6h. p.r.n. pain, Albuterol MDI 2 puffs q.6h. p.r.n. for  shortness of breath or wheeze.   DISCHARGE INSTRUCTIONS:  He is to follow up with me in 2-4 weeks, with Dr.  Daphine Deutscher of surgery in two weeks.  Also, with Dr. Brunilda Payor in two weeks.  He is to  go home with indwelling Foley catheter with a leg bag.  He will require  outpatient cystoscopy.  He will have his staples removed  in about 1-2 weeks.      Fleet Contras, M.D.  Electronically Signed     EA/MEDQ  D:  03/03/2005  T:  03/04/2005  Job:  [id number]   cc:   [place] Daphine Deutscher, MD  [address], [address]  [place]  [place] [address]   Lindaann Slough, M.D.  Fax: [phone]

## 2010-08-02 NOTE — Op Note (Signed)
NAMECAPRI, [name] NO.:  [id number]   MEDICAL RECORD NO.:  [id number]          PATIENT TYPE:  INP   LOCATION:  4711                         FACILITY:  [facility]   PHYSICIAN:  Lindaann Slough, M.D.  DATE OF BIRTH:  October 31, 1940   DATE OF PROCEDURE:  07/18/2005  DATE OF DISCHARGE:                                 OPERATIVE REPORT   PREOPERATIVE DIAGNOSIS:  Urethral stricture.   POSTOPERATIVE DIAGNOSES:  1.  Urethral stricture.  2.  Vesicocolonic fistula.   OPERATION/PROCEDURE:  1.  Cystoscopy.  2.  Urethral dilation.   SURGEON:  Danae Chen, M.D.   INDICATIONS:  The patient is a 70 year old male who has a history of  urethral stricture.  He also had colon resection in December 2006 for colon  cancer.  For the past week he had been complaining of poor appetite,  weakness, and for the past five days, he has been having diarrhea.  He was  seen today and was found to have renal insufficiency with creatinine of 3.1  and BUN of 116.  He needs a Foley catheter insertion and because of the  history of urethral stricture, I will do cystoscopy to dilate the urethra.   DESCRIPTION OF PROCEDURE:  After instillation of 2% Xylocaine jelly in the  urethra, a flexible cystoscope was passed in the urethra.  There is a  stricture in the bulbous urethra and the cystoscope could not be passed into  the bladder.  A guidewire was passed through the cystoscope and through the  stricture in the bladder and then the cystoscope was removed.  I dilated the  urethra with Hayman's dilators up to #20-French.  Then I was able to pass a  #16 Council tip catheter over the guidewire into the bladder.  I was not  able to obtain any urine out of the catheter.  I irrigated the catheter and  obtained only thick material out of the bladder.  Then I passed the  guidewire through the Councill catheter, removed the Councill-tip catheter.  Then the cystoscope was passed in the urethra and I was able to  pass the  cystoscope through the stricture.  He has moderate prostatic hypertrophy and  the cystoscope was passed in the bladder, but I was not able to visualize  the bladder mucosa because of the content of the bladder.  The bladder is  full of a thick greenish=brown material.  I removed the cystoscope and  passed a #16 Councill-tip catheter over the guidewire into the bladder.  I  then irrigated again in the bladder with normal saline and after that, I  drained about 800 mL of thick, cloudy, foul-smelling material out of the  bladder.  The catheter was then left to straight drainage.      Lindaann Slough, M.D.  Electronically Signed     MN/MEDQ  D:  07/18/2005  T:  07/21/2005  Job:  [id number]

## 2010-08-02 NOTE — Consult Note (Signed)
[name], [name] NO.:  [id number]   MEDICAL RECORD NO.:  [id number]          PATIENT TYPE:  INP   LOCATION:  5511                         FACILITY:  [facility]   PHYSICIAN:  Lindaann Slough, M.D.  DATE OF BIRTH:  [birth year]   DATE OF CONSULTATION:  02/14/2005  DATE OF DISCHARGE:                                   CONSULTATION   REASON FOR CONSULTATION:  Foley catheter insertion.   HISTORY OF PRESENT ILLNESS:  The patient is a 70 year old male who was  admitted with acute renal failure, bronchitis, and dehydration.  The nurses  could not pass the Foley catheter in the bladder.  I was asked to come in  and insert the Foley catheter.  The patient states that he did not have any  voiding symptoms until this evening when he started having decreased force  of the urinary stream and voiding small amount of urine at a time. There is  no hematuria.   PAST MEDICAL HISTORY:  He denies any history of diabetes, hypertension. As  previously stated, he has not seen a physician in many years.   PAST SURGICAL HISTORY:  None.   MEDICATIONS:  Albuterol, started this week, for COPD.   ALLERGIES:  No known drug allergies.   SOCIAL HISTORY:  He has smoked about 2 packs a day.  He is married and lives  with his family.  He does not drink.   FAMILY HISTORY:  Noncontributory.   REVIEW OF SYSTEMS:  He complains of shortness of breath and poor appetite,  and he started having difficulty voiding this afternoon, but before then, he  did not have any dysuria or straining on urination or frequency and  everything else is negative.   PHYSICAL EXAMINATION:  GENERAL:  This is a cachectic, dehydrated 70 year old  male.  VITAL SIGNS:  Blood pressure is 118/68, pulse 106, respirations 20,  temperature 97.8.  ABDOMEN:  Soft.  He is tender in the suprapubic area.  He has no CVA  tenderness. Kidneys are not palpable.  He has no hepatomegaly, no  splenomegaly.  He has no inguinal hernia.  GENITOURINARY:  Penis is uncircumcised.  Meatus is normal.  Scrotum is  normal.  He has no hydrocele.  No testicular mass.  Cords and epididymes are  within normal limits.  RECTAL:  On examination, sphincter tone is normal.  He has no external  hemorrhoids. Prostate is enlarged, 40 g, no nodules.  Seminal vesicles are  not palpable.  He has dark stools in the rectum.   DESCRIPTION OF PROCEDURE:  I attempted to insert a #16 Foley catheter in the  bladder, but there was obstruction in the posterior urethra.  I then passed  a flexible cystoscope in the urethra, and there was a stricture in the  posterior urethra, and the cystoscope could not be passed in the bladder.  I  then passed the Glidewire through the cystoscope into the bladder.  I  removed the cystoscope and then dilated the urethra up to #20 [place] with  Hayman's dilator, up to #20 [place].  I then passed a #  16 Councill tip  catheter over the Glidewire into the bladder. About 700 mL of cloudy urine  were drained out of the bladder.  The catheter was left to straight  drainage.  Urine was sent for culture and sensitivity.  The patient is on  Rocephin and Zithromax.   We will follow the patient with you.   REFERRING PHYSICIAN:  Fleet Contras, M.D.      Lindaann Slough, M.D.  Electronically Signed     MN/MEDQ  D:  02/15/2005  T:  02/16/2005  Job:  [id number]

## 2010-08-02 NOTE — Consult Note (Signed)
[name], [name]   MEDICAL RECORD NO.:  [name]          PATIENT TYPE:  INP   LOCATION:  4711                         FACILITY:  [facility]   PHYSICIAN:  [name], M.D.    DATE OF BIRTH:  06-22-1940   DATE OF CONSULTATION:  07/18/2005  DATE OF DISCHARGE:                                   CONSULTATION   REASON FOR CONSULTATION:  Dr. Concepcion Elk, thank you for asking me to see [name], a pleasant but cachectic 70 year old gentleman admitted today with  failure to thrive, azotemia, and acidosis, who was seen by Dr. Su Grand,  who identified the possibility of an enteral urinary fistula.   The patient underwent transverse colectomy back in December 2006 for an  adenocarcinoma, which at that time, did not appear to be that advanced. He  was also admitted with a stricture of the urethra at that time, which was  dilated and was doing well post surgery. He had been seen by Dr. Daphine Deutscher as  an outpatient and was doing fine. This was up until a couple of weeks ago,  when he lost his appetite and started having diarrhea. He became more sick  and was admitted today with severe dehydration. Based on his history, his  past medical history, his colon cancer treated with hemicolectomy in  December, he has had urinary retention secondary to urethral stricture, and  COPD. He has also had some anemia of chronic disease.   MEDICATIONS:  He was on Spiriva for COPD and albuterol was his only  medications.   PHYSICAL EXAMINATION:  VITAL SIGNS:  He appears to be afebrile.  GENERAL:  He is markedly cachectic.  SKIN:  He has got decreased skin turgor.  LUNGS:  Clear.  ABDOMEN:  Soft and flat but the patient cannot lie on his back and be  comfortable. He is hiccupping and has nausea.  RECTAL:  I did not perform a rectal examination but Dr. Brunilda Payor did and he  found brown stool. The patient does report having some pneumaturia and in  his Foley catheter, there is  greenish, grayish drainage along with urine.   LABORATORY DATA:  I have reviewed all of his x-rays. The only thing that he  has had done so far is a chest x-ray.   IMPRESSION/PLAN:  My impression is that he does possibly have an enteral  urinary fistula. However, I am unclear as to how he may have develops this,  seeing that he had a transverse colon lesion, which was not very large and  invasive at the time in December 2006 and he had a straight forward right  hemicolectomy. I do think that some  type of radiologic procedure to help diagnose what is going on with this  patient is helpful. A CT scan of the abdomen and pelvis with oral but  without intravenous contrast will be helpful. I have recommended this and  Dr. Brunilda Payor also has recommended a cystogram to help delineate the possibility  of a vesicle colo-vesicle fistula.      [name], M.D.  Electronically Signed     JOW/MEDQ  D:  07/18/2005  T:  07/21/2005  Job:  [id number]   cc:   Lindaann Slough, M.D.  Fax: [phone]   Fleet Contras, M.D.  Fax: [phone]

## 2010-08-02 NOTE — Consult Note (Signed)
NAMESKANDA, WORLDS NO.:  [id number]   MEDICAL RECORD NO.:  [id number]          PATIENT TYPE:  INP   LOCATION:  5511                         FACILITY:  [facility]   PHYSICIAN:  Genene Churn. [place], M.D.DATE OF BIRTH:  05/12/1940   DATE OF CONSULTATION:  02/28/2005  DATE OF DISCHARGE:  03/03/2005                                   CONSULTATION   REASON FOR CONSULTATION:  Colorectal adenocarcinoma.   REFERRING PHYSICIAN:  Dr. Daphine Deutscher   HISTORY OF PRESENT ILLNESS:  [name] is a 70 year old gentleman admitted on  February 14, 2005, with acute renal failure, dehydration, and a 10-pound  weight loss, as well as anemia. Dr. Madilyn Fireman evaluated the patient, performing  a colonoscopy remarkable for a 2.5 cm flat plate-like mass at the ascending  colon suspicious for carcinoma. Surgery evaluated the patient on February 20, 2005.  Dr. Derrell Lolling. Resection was recommended. Right hemicolectomy with  sentinel lymph node biopsy was done on February 25, 2005.  The pathology  report, case #[id number], Dr. Luisa Hart, was positive for colorectal  adenocarcinoma, moderately differentiated, 2.2 cm maximum size, all margins  negative. The distance of the invasive carcinoma from the nearest margin was  4.5 cm from distal. No perforation of the visceral peritoneum. The mass  invades into but not through the muscularis propria. No vascular or  lymphatic invasion. Of nine lymph nodes, all negative. He is T2 N0 MX. ,  Stage I   PAST MEDICAL HISTORY:  1.  COPD/tobacco abuse.  2.  Acute renal failure on admission, now controlled with hydration.  3.  Iron deficiency anemia.  4.  Ejection fraction 45-50%.  5.  Minimal nonobstructive CAD per cardiac catheterization.  6.  Bilateral hydronephrosis on February 14, 2005, secondary to colon mass,      status post evaluation by urology, now controlled.  7.  Acute renal insufficiency secondary to bilateral hydronephrosis,      corrected with rehydration.   SURGERY:  1.  Status post cardiac catheterization February 21, 2005, Dr. Gala Romney.   ALLERGIES:  No known drug allergies.   CURRENT MEDICATIONS:  1.  Proventil nebulizer q.i.d.  2.  Ferrous sulfate 325 mg daily.  3.  Lopressor 2.5 mg IV q.12h.  4.  Flagyl 500 mg q.8h.  5.  Protonix 40 mg daily.  6.  Ventolin two puffs q.i.d.  7.  Morphine sulfate as directed p.r.n.  8.  Ambien and Valium p.r.n.  9.  Albumin p.r.n.   REVIEW OF SYSTEMS:  Remarkable for fatigue, a 10-pound weight loss over the  last 2 weeks prior to admission, some dyspnea on exertion, but no shortness  of breath at rest. No nausea or vomiting. He does have normal bowel  movements with no blood in the stools. Some urgency with urination. No gross  hematuria. No musculoskeletal pain or dysesthesias at this time. Father died  of old age at 6 and mother died at 59 of old age as well. No sisters or  brothers.   SOCIAL HISTORY:  The patient is married. 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 No alcohol history. He smokes  about two packs a day of cigarettes for at least 50 years, although he quit  about 3 weeks ago. The patient has not had a primary M.D. until recently,  when he was diagnosed by Dr. Concepcion Elk with COPD. He lives in [place]. He  is [place].   PHYSICAL EXAMINATION:  GENERAL:  This is a well-developed, very thin 64-year-  old black male in no acute distress, alert and oriented x3.  VITAL SIGNS:  Stable, afebrile. Weight 114 pounds, height 76 inches.  HEENT:  Normocephalic, atraumatic. PERRLA. Oral mucosa without thrush or  lesions.  NECK:  Supple. No cervical or supraclavicular masses.  LUNGS:  Essentially clear to auscultation with the exception of minimal  wheezing. There are some atelectatic sounds heard as well throughout. No  axillary masses.  CARDIOVASCULAR:  Regular rate and rhythm without murmurs, rubs, or gallops.  ABDOMEN:  Soft, nontender. He has  staples present. No palpable spleen or  liver.  GENITAL AND RECTAL:  Deferred.  EXTREMITIES:  No clubbing or cyanosis, no edema.  SKIN:  Without lesions or bruising, no petechia.  NEUROLOGIC:  Nonfocal.   LABORATORY DATA:  Hemoglobin 9.6, hematocrit 28.9, white count 9.7,  platelets 315, MCV 70. Ferritin 426, B12 897, iron less than 10. Sodium 133,  potassium 3.3, BUN 4, creatinine 1.5, glucose 95, total bilirubin 0.6,  alkaline phosphatase 60, AST 25, ALT 35, total protein 7.7, albumin 1.8,  calcium 8.2. Hepatitis B surface antigen negative. Hepatitis B antibody  quantitive positive. Hepatitis C antibody negative. ACTH 14. HIV negative.  Uric acid 4.2.   ASSESSMENT:  Dr. Cyndie Chime has seen and evaluated the patient and the  chart has been reviewed. This is a 70 year old African-American male with no  prior medical or surgical illnesses who presents with 1-week history of  progressive weakness, dyspnea on exertion. He was dehydrated and had a  severe microcytic anemia. Colonoscopy on February 20, 2005, showed a mass in  the ascending colon. He underwent a right hemicolectomy on February 25, 2005. The pathology shows a 2.2 cm moderately differentiated adenocarcinoma  into but not through the muscularis propria. No vascular or lymphatic  invasion. Nine lymph nodes negative. CEA unknown, not recorded.   ASSESSMENT AND PLAN:  Stage 1 T2 N0 M0 colon cancer Due to the approximate  ninety percent  cure rate with surgery alone, adjuvant chemotherapy or  radiation is not necessary. Would recommend  follow-up with primary care  physician.   Thank you very much for allowing [place] the opportunity to participate in the  care of [name].      Marlowe Kays, P.A.      Genene Churn. Cyndie Chime, M.D.  Electronically Signed    SW/MEDQ  D:  03/03/2005  T:  03/03/2005  Job:  [id number]

## 2010-08-02 NOTE — Discharge Summary (Signed)
NAMEKELVEN, FLATER NO.:  [id number]   MEDICAL RECORD NO.:  [id number]          PATIENT TYPE:  INP   LOCATION:  4711                         FACILITY:  [facility]   PHYSICIAN:  Fleet Contras, M.D.    DATE OF BIRTH:  1940-11-15   DATE OF ADMISSION:  07/18/2005  DATE OF DISCHARGE:  07/26/2005                                 DISCHARGE SUMMARY   ADMITTING DIAGNOSES:  1.  Failure to thrive.  2.  Dehydration, severe.  3.  Obstructive uropathy.  4.  Acute renal failure.  5.  Anemia of chronic disease.  6.  Metabolic acidosis.  7.  History of colon cancer.  8.  Hypokalemia.   DISCHARGE DIAGNOSES:  1.  Failure to thrive.  2.  Severe dehydration.  3.  Urethral stricture with bilateral hydronephrosis.  4.  Acute renal failure.  5.  Iron-deficiency anemia.  6.  Metabolic acidosis.  7.  Hypokalemia.  8.  Severe esophagitis and gastritis.  9.  History of colon cancer.  10. Escherichia coli urinary tract infection.   HISTORY OF PRESENTING ILLNESS:  Mr. Brain Honeycutt is a 70 year old African-  American gentleman with a past medical history significant for colon cancer  diagnosed in December of 2006 status post right hemicolectomy without need  for adjuvant chemotherapy or revision therapy, chronic obstructive pulmonary  disease secondary to heavy tobacco use, now abstained from smoking for the  last 5 months.  He presented to the emergency room at [facility]  with a several day history of feeling weak, tired, poor appetite, nausea,  vomiting, and diarrhea, with inability to keep any food or drinks down.  There was no blood in the vomitus or in his stools.  He had apparently been  having some hesitancy or straining with urination at times, but states he  was voiding well otherwise.  During his last admission in December of 2006,  he was found to have a ureteral stricture and had ureteral dilatation  performed by Dr. Brunilda Payor followed by cystoscopy in January of 2007  showing  heavily trabeculated bladder without evidence of stone or tumor.  He denied  having any dysuria or gross hematuria.  In the emergency room, he was noted  to be severely dehydrated with initial laboratory data showing a hemoglobin  of 8.6, hematocrit of 7.8, WBC count was 14.3.  BUN was 16, creatinine 3.1,  potassium 3.7, sodium of 152, chloride 107, bicarbonate of 6, glucose of 23.  Chest x-ray showed no acute infiltrates but evidence of chronic obstructive  pulmonary disease.  Urinalysis was pending, the patient could not be  catheterized.  A urologic consult was requested, and the patient was seen by  Dr. Lindaann Slough, who was familiar with the patient's previous urologic  history.  He proceeded with a bedside cystoscopy which revealed a lot of  cloudy material in the bladder, which had the suspicion of possible vesico-  colonic fistula and requested for a cystogram and a renal ultrasound.  General surgery consultation was also requested, and the patient was seen by  Dr. Aleatha Borer for suspicion  of possible fistula.  Based on the finding of  cloudy urine, CT of the abdomen without contrast was performed, and this was  negative for any evidence of fistula.  The cystogram revealed no evidence of  a fistulization, positive trabeculations of the bladder wall with high-grade  bilateral vesicoureteric reflux and bilateral hydroureters and  hydronephrosis.  A urine culture was positive for Escherichia coli.  The  patient had been on intravenous Zosyn while also receiving IV hydration.  His BUN and creatinine continued to improve with IV hydration, and he was  able to tolerate oral intake.  Potassium was repleted both orally and  intravenously. The patient continued to complain of a poor appetite and some  dyspepsia.  A gastrointestinal consult was requested, and the patient was  seen by Dr. Madilyn Fireman, who preceded with an upper GI endoscopy.  A barium  swallow was also performed, and  this was negative for any esophageal  lesions.  Esophagogastroduodenoscopy performed on Jul 25, 2005 revealed  severe esophagitis and possible reflux and mild gastritis.  He recommended  that the patient's Protonix be increased from once a day to twice a day.  The patient was also started on Megace 400 mg daily for improvement in his  appetite.   PHYSICAL EXAMINATION:  GENERAL:  Today, Mr. Ferdig is lying comfortably in  bed, not in acute respiratory or painful distress.  He is tolerating full  oral intake, and he has actually gained about 5 pounds in the last few days.  He is also on Ensure three times a day, which he is tolerating.  He is well-  hydrated and not cyanosed.  VITAL SIGNS:  Today, his vital signs are stable.  He is afebrile.  CHEST:  Normal.  Heart sounds 1 and 2 are heard.  ABDOMEN:  Soft, nontender.  No masses.  Bowel sounds are present.  EXTREMITIES:  No edema, no calf tenderness or swelling.   ASSESSMENT:  Mr. Cordarrell Sane is a 70 year old African-American gentleman  admitted with severe dehydration, obstructive uropathy, renal insufficiency.  These have all improved with hydration and Foley catheterization.  He also  had an endoscopy which showed esophagitis and gastritis, and he is now on  Protonix 40 mg twice daily.  He is also on Megace for appetite enhancement,  and he is already gaining some weight, which increased oral intake.  He is  considered stable for discharge home today.   DISCHARGE MEDICATIONS:  He is to continue with prescribed medications,  including:  1.  Protonix 40 mg twice a day.  2.  Multivitamin once a day.  3.  Albuterol M.D.I. two puffs q.6h. p.r.n.  4.  Ciprofloxacin 500 mg one p.o. b.i.d.  5.  Megace 100 mg per 10 mL, 10 mL daily.  6.  Ferrous sulfate 325 mg b.i.d.  7.  Ensure Plus one can t.i.d.   FOLLOW UP:  Follow up will be with Dr. Madilyn Fireman in 2-4 weeks, with me in 2  weeks, with Dr. Brunilda Payor in 1-2 weeks.  He is to go home with the  Foley catheter in situ with a leg bag.  This plan  of care has been discussed with him, and all his questions have been  answered.      Fleet Contras, M.D.  Electronically Signed     EA/MEDQ  D:  07/26/2005  T:  07/28/2005  Job:  [id number]

## 2010-08-02 NOTE — Op Note (Signed)
NAMEJORIEL, STREETY NO.:  [id number]   MEDICAL RECORD NO.:  [id number]          PATIENT TYPE:  INP   LOCATION:  5511                         FACILITY:  [facility]   PHYSICIAN:  John C. Madilyn Fireman, M.D.    DATE OF BIRTH:  07-Apr-1940   DATE OF PROCEDURE:  02/20/2005  DATE OF DISCHARGE:                                 OPERATIVE REPORT   PROCEDURE:  Colonoscopy with biopsy.   INDICATIONS FOR PROCEDURE:  Anemia is a 70 year old male with no prior colon  screening.   PROCEDURE:  The patient was placed on the left lateral decubitus position  and placed on the pulse monitor with continuous low-flow oxygen delivered by  nasal cannula.  He was sedated with 60 mcg IV fentanyl and 6 mg IV Versed.  The Olympus video colonoscope was inserted into the rectum and advanced to  the cecum, confirmed by transillumination of McBurney's point and  visualization of the ileocecal valve and appendiceal orifice.  The prep was  excellent.  The cecum and proximal ascending colon appeared normal with no  masses, polyps, diverticula or other mucosal abnormalities.  In the distal  ascending colon near the hepatic flexure there was a flat, firm, friable  mass approximately 2.5 cm with a central depressed area.  Although not very  bulky, it was very consistent in appearance and texture with adenocarcinoma.  Multiple biopsies were taken.  The remainder of the ascending, transverse,  descending, sigmoid and rectum appeared normal with no further masses,  polyps, diverticula or other mucosal abnormalities.  The scope was then  withdrawn and the patient returned to the recovery room in stable condition.  He tolerated the procedure well, and there were no immediate complications.   IMPRESSION:  Small ascending colon mass.   PLAN:  Await biopsies.  Will need surgical consultation.           ______________________________  [name], M.D.     JCH/MEDQ  D:  02/20/2005  T:  02/20/2005  Job:   [id number]   cc:   Fleet Contras, M.D.  Fax: [phone]

## 2010-08-02 NOTE — Consult Note (Signed)
[name], [name]               ACCOUNT NO.:  [id number]   MEDICAL RECORD NO.:  [id number]          PATIENT TYPE:  INP   LOCATION:  4711                         FACILITY:  [facility]   PHYSICIAN:  John C. Madilyn Fireman, M.D.    DATE OF BIRTH:  02/24/1941   DATE OF CONSULTATION:  07/24/2005  DATE OF DISCHARGE:                                   CONSULTATION   REASON FOR CONSULTATION:  Anorexia, dyspepsia, iron deficiency anemia.   HISTORY OF PRESENT ILLNESS:  [name] is a 70 year old male with a history  of stage 1- T2, N0, M0 colon cancer for which he has had a right  hemicolectomy in December of 2006.  He initially did well after his surgery  and discharged at that time, with no noticeable complaints and reasonable  appetite.  However, approximately two weeks prior to admission to the  hospital this time, he developed severe anorexia with some crampy epigastric  pain for which he takes aspirin and which is relieved with eating.  He  further endorses a 30 pound weight loss over the prior two weeks before  admission.  He was hospitalized this time for acute renal failure in the  setting of a failure to thrive picture.   PAST MEDICAL HISTORY:  1.  Colon adenocarcinoma.      1.  Stage 1 - T2, N0, M0.      2.  Status post right hemicolectomy.      3.  The patient did not receive any radiation therapy or chemotherapy          for this limited cancer.  2.  Chronic obstructive pulmonary disease diagnosed within the last year.  3.  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  4.  Chronic renal insufficiency with a baseline creatinine of 1.5, with a      history of hydronephrosis secondary to colon cancer.  5.  Microcytic anemia with a hemoglobin of 9.6 at the time of discharge in      December.   HOME MEDICATIONS:  According to the patient, he takes:  1.  Spiriva occasionally.  2.  Albuterol p.r.n.  3.  Aspirin p.r.n. epigastric pain.   ALLERGIES:  NO KNOWN DRUG ALLERGIES.   FAMILY HISTORY:  The patient denies any known familial health problems,  stating that his parents both died in their old age, and he does not have  any siblings and no children with health problems.   SOCIAL HISTORY:  He is married.  He is retired Engineer, site.  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  He has not had any alcohol in the past year  and has no history of heavy alcohol use.  He is a former smoker and says  that he quit about five months ago.   REVIEW OF SYSTEMS:  As per the HPI.  The patient also denies any fevers or  chills.  He denies chest pain or shortness of breath.  He does endorse loose  stools with approximately two loose stools  per day.  He denies any  dysphagia, nausea, vomiting, hematochezia, or melena.  He does endorse cold  intolerance.   PHYSICAL EXAMINATION:  VITAL SIGNS:  He is afebrile with a blood pressure of  112/59, pulse 85, respiratory rate 18, O2 saturation 100% on 2 L.  His  weight at the time of admission was 96 pounds.  Weight at the time of  discharge in December of 2006 was 106 pounds.  GENERAL:  He is alert and extremely cachectic.  No acute distress.  Appears  older than stated age.  CARDIOVASCULAR:  Regular rate and rhythm.  LUNGS:  Respirations are clear with fair air movement.  ABDOMEN:  Scaphoid.  Positive bowel sounds.  Soft.  RECTAL:  Rectal exam was refused by the patient.  EXTREMITIES:  He has no peripheral edema.   LABORATORY DATA:  White count is 10.2, hemoglobin 9.0, MCV 74, RDW 19,  platelet count 243.  His previous hemoglobin shows that it was 9.6 at the  time of discharge in December and 8.6 at the time of admission last week.  He is status post 2 units of packed red blood cells.  CMET shows a sodium of  135, potassium 3.7, chloride 118, bicarbonate 14, BUN 13, creatinine 1.6,  glucose 97.  Total bilirubin 1.0, alkaline phosphatase 54, AST 11, ALT 10,  albumin 2.1.  Ferritin is  584, iron 10, TIBC 208, percent saturation 5.  B12  1946.  Folate greater than 20.  LDH 76.  CEA level was 1.1.  PSA 1.30.   IMAGING STUDIES:  He had a CT scan of the abdomen and pelvis without  contrast media which showed cholelithiasis, bilateral hydronephrosis, and  multiple old granulomas.  It also showed a bulge in the left upper pole of  the kidney, not felt to be malignant per imaging.  Renal ultrasound revealed  bilateral hydronephrosis, and a barium swallow was within normal limits.   ASSESSMENT:  1.  Anorexia with weight loss.  2.  Dyspepsia.  3.  Persistent microcytic anemia.   PLAN:  The patient has been scheduled for upper endoscopy for tomorrow  morning.      Manning Charity, MD      ______________________________  [name], M.D.    KK/MEDQ  D:  07/24/2005  T:  07/25/2005  Job:  [id number]

## 2010-08-02 NOTE — Discharge Summary (Signed)
[name], [name] NO.:  [id number]   MEDICAL RECORD NO.:  [id number]          PATIENT TYPE:  INP   LOCATION:  5012                         FACILITY:  [facility]   PHYSICIAN:  Fleet Contras, M.D.    DATE OF BIRTH:  14-Aug-1940   DATE OF ADMISSION:  10/07/2006  DATE OF DISCHARGE:  10/17/2006                               DISCHARGE SUMMARY   HISTORY OF PRESENTING ILLNESS:  [name] is a 70 year old African-  American gentleman with past medical history significant for colon  cancer status post right hemicolectomy, urethroscopy status post  cystoscopy and dilatation in 2007, benign prostatic hypertrophy with  hydroureter, hydronephrosis diagnosed in 2007,  January.  On a Foley  drainage.  Chronic obstructive pulmonary disease, gastroesophageal  reflux disease, chronic renal insufficiency and anemia of iron  deficiency.  He presented to the Emergency Room at [facility]  with complaints of fatigue, feeling unwell for about a month.  He is  feeling that he was having diarrhea over the independence holiday and  had been having diarrhea on and off since that time.  He did have some  chilly feelings but denied any abdominal pain, chest pain, shortness of  breath, or orthopnea.  He felt lightheaded.  No falls, syncope, or  seizures.  He was unable to keep food or drink down for 2 days prior to  presentation.  He had no vomiting of blood or passing blood in his  stools.  In the emergency room, he was noted to be hypertensive and  tachycardic but responded well to intravenous fluid resuscitation  initially but then blood pressure dropped again to the 60s and 70s and  was therefore admitted to the intensive care unit.   HOSPITAL COURSE:  On admission, his initial blood pressure was 85/45;  this improved to 101/47, then dropped down to 66/44.  He was febrile  with a temperature of 102.6, and he was not in any acute respiratory or  painful distress.  He was clinically  ill-looking and lethargic.  His  heart rate was initially 146.  It went down to 105.  He had a pale  conjunctiva, not icteric or cyanosed but was dehydrated, and his Foley  was draining cloudy urine.  His abdomen was slightly distended, but  nontender with positive bowel sounds.  He had no peripheral edema.  He  had some chronic venous stasis changes.  He was alert and oriented x3  with no focal neurological deficits.   INITIAL LABORATORY DATA:  Showed a sodium of 140, potassium 4.2,  chloride 103, bicarbonate 20, BUN of 67, creatinine of 0.7, glucose of  62.  White count was 20.1, hemoglobin 9.2, hematocrit 37.4.  AST was 36,  ALT 31, alkaline phosphatase 16, bilirubin 1.6, albumin 1.7.  pH was  7.36.  Urinalysis was positive for large leukocyte esterase, 7-10 WBCs,  per high powered field.  Chest x-ray shows emphysema of acute disease.  EKG shows sinus tachycardia.   ADMISSION DIAGNOSES:  Shock. septic/hypovolemic with urosepsis,  dehydration, acute-on-chronic renal failure.  He was started on IV  fluids maintenance  as well as IV Zosyn 2.25 g every 8 hours, IV Protonix  40 mg daily, and IV Zofran 4 mg daily.  Cultures were obtained for  blood, urine, stool, Clostridium difficile toxin.  Serial CKs and  troponin performed.  He was type-and-cross matched, and critical care  medicine consult was requested for invasive monitoring.  CT scan of the  abdomen was performed, which showed gallstones, possible cholecystis.  Surgical consult was requested, and the patient was consulted for  surgery who did not believe that the patient had acute cholecystitis at  this time and was not in any need for surgery.  Urology consult was  requested, and the patient was seen by Dr. Brunilda Payor, who recommended leaving  the Foley in situ.  He is following up outpatient.  He was on heparin  for DVT prophylaxis.  His condition began to improve, although his white  count initially went up to above 36,000, and his  potassium dropped to  3.0, which was repleted.  He was continued on intravenous antibiotics  with ciprofloxacin.  Urine culture grew E. coli which was sensitive to  Cipro.  A nutrition consult was requested.  He was started on  nutritional supplement with Ensure.  He required blood transfusion to  maintain his hemoglobin at about 10.  On October 11, 2006, he was stable  and now is to be transferred out to a telemetry bed, where he will  continue the intravenous antibiotics.  A gastroenterology consult was  requested.  The patient was seen by Dr. Evette Cristal who proceeded with  colonoscopy on October 14, 2006, revealing three polyps which were  biopsied.  He was converted to oral ciprofloxacin, which he was able to  tolerate and on October 17, 2006, he was feeling much better.  Vital signs  were stable.  He was tolerating a full diet.  He was considered stable  for discharge.   DISCHARGE DIAGNOSES:  1. E. coli urosepsis.  2. Status post shock.  3. History of colon cancer, colon polyps on colonoscopy.  4. Acute-on-chronic renal failure.  5. History of obstructive uropathy.  6. Iron deficiency anemia.   CONDITION ON DISCHARGE:  Stable.   DISPOSITION:  Home   DISCHARGE MEDICATIONS:  1. Ferrous sulfate 325 mg b.i.d.  2. Multivitamin one a day.  3. Albuterol 2 puffs every 6 p.r.n.  4. Protonix 40 mg daily.  5. Ciprofloxacin 500 mg b.i.d. for 5 days.   He is to follow up with me in the office in 2 weeks and also Dr. Brunilda Payor,  urologist, and with Dr. Madilyn Fireman in 4 weeks.  Discharge plan was discussed  with him, and his questions were answered.      Fleet Contras, M.D.  Electronically Signed     EA/MEDQ  D:  11/19/2006  T:  11/19/2006  Job:  [id number]

## 2010-08-02 NOTE — Consult Note (Signed)
NAMELEOVARDO, THOMAN NO.:  [id number]   MEDICAL RECORD NO.:  [id number]          PATIENT TYPE:  INP   LOCATION:  5511                         FACILITY:  [facility]   PHYSICIAN:  [name], M.D.DATE OF BIRTH:  Jan 10, 1941   DATE OF CONSULTATION:  02/20/2005  DATE OF DISCHARGE:                                   CONSULTATION   HISTORY OF PRESENT ILLNESS:  In general, [name] is a 70 year old male  patient who has not really been to see a doctor during his lifetime.  He  recently started seeing [name] and was diagnosed with COPD and started  on  inhalers.  Over the past two weeks, he has quite not been feeling well  and has a 10-pound weight loss.  He has decreased intake and subsequent  dehydration.  On admission, he had acute renal failure but now this has been  corrected with rehydration.  He was also found to have iron-deficiency  anemia.  He was seen in consult with [name]. [name] and underwent a  colonoscopy and findings are a 2.5 cm flat, plaque-like mass detected at the  ascending colon and it was felt to be likely carcinoma.  We have been  consulted for surgical resection.  Since the patient has already been  prepped for his colonoscopy, our preference would be to perform the  resection in the morning.   PAST MEDICAL HISTORY:  None with the exception of what I mentioned above.   ALLERGIES:  None.   CURRENT MEDICATIONS:  Cipro, iron, Luvox and Protonix.   SOCIAL HISTORY:  Smoker.  No alcohol use.   FAMILY HISTORY:  No family history of cancers.   LABORATORY DATA:  BUN 7, creatinine 1.4, potassium 3.1, white count normal,  hemoglobin 9.1, hematocrit 27.4.  LFTs are normal and CK was 86 with MB of  8.4, troponin was 0.37.  His EKG shows Q waves septally.  Chest x-ray shows  COPD.   PHYSICAL EXAMINATION:  HEENT:  Normal carotid upstroke, no bruits. No JVD.  No thyromegaly with clear conjunctiva.  Normal nares without drainage.  CHEST:  Clear  to auscultation bilaterally.  HEART:  Regular rate and rhythm.  No murmurs, rubs.  ABDOMEN:  Soft, nontender, nondistended, no masses or edema.  EXTREMITIES:  No peripheral edema.  SKIN:  Warm and dry.  NEURO:  Grossly intact.   ASSESSMENT:  1.  Right hepatic colon mass, suspect carcinoma and need for colectomy.      However, due to his abnormal EKG      with elevated enzymes, he needs cardiac workup preoperatively and I will      call this right now.  Right now, I am going to go ahead and prep him for      the colectomy in the morning, although the cardiac workup will need to      be complete.      [name], P.A.      [name], M.D.  Electronically Signed    LB/MEDQ  D:  02/20/2005  T:  02/20/2005  Job:  [id number]   cc:   [name], M.D.  Fax: [phone]

## 2010-08-02 NOTE — Cardiovascular Report (Signed)
NAMEMADOX, CORKINS NO.:  [id number]   MEDICAL RECORD NO.:  [id number]          PATIENT TYPE:  INP   LOCATION:  5511                         FACILITY:  [facility]   PHYSICIAN:  Arvilla Meres, M.D. LHCDATE OF BIRTH:  Oct 22, 1940   DATE OF PROCEDURE:  02/21/2005  DATE OF DISCHARGE:                              CARDIAC CATHETERIZATION   CARDIOLOGIST:  Dr. Jerral Bonito   PATIENT IDENTIFICATION:  [name] is a delightful, 70 year old male with  history of tobacco use, but no known coronary disease.  He was admitted  recently with failure to thrive.  He was found to be in renal failure due to  obstruction with bilateral hydronephrosis.  Workup revealed abdominal mass  and he is pending surgery.  We were called for preoperative clearance given  mildly elevated troponin.  He underwent echocardiogram which showed mildly  reduced EF in the 45-50% range with a question of a regional wall motion  abnormality.  He is thus referred for cardiac catheterization.   PROCEDURES PERFORMED:  1.  Selective coronary angiography.  2.  Left heart catheterization.  3.  Angio-Seal femoral artery closure.  4.  No left ventriculogram was done in order to preserve contrast.   DESCRIPTION OF PROCEDURE:  The risks and benefits of catheterization were  explained to [name].  Consent was signed and placed on the chart.  Prior  to catheterization, the patient was hydrated copiously with IV fluids and  also was treated with a load of bicarb.  Precatheterization creatinine was  approximately 1.5.  A 5-French arterial sheath was placed in the right  femoral artery using modified Seldinger technique.  Standard catheters  including a preformed Judkins JL-4, JR-4 and angled pigtail were used for  the catheterization.  All catheter exchanges were made over wire.  There  were no apparent complications.  At the end of the procedure, the patient's  femoral artery arteriotomy site was sealed with Angio-Seal  closure device.  There was good hemostasis.  No apparent complications.   Central aortic pressure is 106/58 with a mean of 79.  LV pressure was 114/0  with an EDP of 2.  There is no aortic stenosis on pullback.   The left main was normal.   LAD was a long vessel wrapping the apex.  It gave off two moderate-to-large  size diagonals.  There is no significant coronary artery disease.   Left circumflex was moderate-sized system.  It gave off a moderate-sized OM-  1 and a large branching OM-2.  There is a 30-40% stenosis in the mid section  of the native left circumflex.   RCA was a moderate-sized dominant vessel.  It gave off an acute marginal  branch, a small, posterolateral and moderate-sized PDA and small distal  posterolateral.  There is a 40-50% stenosis at the proximal portion of the  RCA at the upper bend.   Left ventriculogram was not done due to an effort to save contrast.  Ejection fraction was 45-50% by echocardiogram.   ASSESSMENT:  1.  Minimal nonobstructive coronary disease.  2.  Mildly reduced ejection fraction by  echocardiogram.   PLAN:  We will proceed with medical therapy.  There is no need for further  cardiac testing prior to surgery.  Will hydrate him given his low EDP and  start low-dose beta blocker.      Arvilla Meres, M.D. [facility]  Electronically Signed     DB/MEDQ  D:  02/21/2005  T:  02/22/2005  Job:  [id number]

## 2010-08-02 NOTE — Discharge Summary (Signed)
NAMEDYLYN, [facility] NO.:  [id number]   MEDICAL RECORD NO.:  [id number]          PATIENT TYPE:  INP   LOCATION:  5012                         FACILITY:  [facility]   PHYSICIAN:  Fleet Contras, M.D.    DATE OF BIRTH:  [birth year]   DATE OF ADMISSION:  10/07/2006  DATE OF DISCHARGE:  10/17/2006                               DISCHARGE SUMMARY   HISTORY OF PRESENT ILLNESS:  Mr. Mahrt is a 70 year old African-American  gentleman who __________.   Dictation ends here      Fleet Contras, M.D.     EA/MEDQ  D:  11/19/2006  T:  11/19/2006  Job:  [id number]

## 2010-12-24 LAB — CBC
HCT: 33.6 — ABNORMAL LOW
HCT: 33.7 — ABNORMAL LOW
HCT: 33.9 — ABNORMAL LOW
HCT: 35.4 — ABNORMAL LOW
Hemoglobin: 10.7 — ABNORMAL LOW
Hemoglobin: 10.9 — ABNORMAL LOW
MCHC: 31.4
MCHC: 31.8
MCHC: 31.8
MCHC: 32.7
MCV: 69.8 — ABNORMAL LOW
MCV: 69.9 — ABNORMAL LOW
MCV: 71.6 — ABNORMAL LOW
Platelets: 194
Platelets: 205
Platelets: 206
Platelets: 284
Platelets: 312
RBC: 4.74
RBC: 4.85
RDW: 16.3 — ABNORMAL HIGH
RDW: 16.4 — ABNORMAL HIGH
WBC: 7.4
WBC: 9.3

## 2010-12-24 LAB — DIFFERENTIAL
Basophils Absolute: 0
Eosinophils Absolute: 0 — ABNORMAL LOW
Lymphs Abs: 0.5 — ABNORMAL LOW
Monocytes Absolute: 1
Neutro Abs: 22.8 — ABNORMAL HIGH

## 2010-12-24 LAB — I-STAT 8, (EC8 V) (CONVERTED LAB)
Acid-base deficit: 4 — ABNORMAL HIGH
Chloride: 106
Hemoglobin: 17.3 — ABNORMAL HIGH
Potassium: 4
Sodium: 140
TCO2: 23

## 2010-12-24 LAB — COMPREHENSIVE METABOLIC PANEL
ALT: 12
Alkaline Phosphatase: 56
CO2: 21
Chloride: 109
GFR calc non Af Amer: 15 — ABNORMAL LOW
Glucose, Bld: 106 — ABNORMAL HIGH
Potassium: 3.5
Sodium: 142

## 2010-12-24 LAB — BASIC METABOLIC PANEL
BUN: 11
BUN: 14
BUN: 23
BUN: 34 — ABNORMAL HIGH
CO2: 19
Calcium: 8.4
Calcium: 8.4
Calcium: 9
Chloride: 107
Chloride: 108
Creatinine, Ser: 2.11 — ABNORMAL HIGH
GFR calc Af Amer: 18 — ABNORMAL LOW
GFR calc non Af Amer: 15 — ABNORMAL LOW
GFR calc non Af Amer: 18 — ABNORMAL LOW
GFR calc non Af Amer: 23 — ABNORMAL LOW
GFR calc non Af Amer: 28 — ABNORMAL LOW
Glucose, Bld: 102 — ABNORMAL HIGH
Glucose, Bld: 93
Glucose, Bld: 95
Potassium: 3.1 — ABNORMAL LOW
Potassium: 3.6
Potassium: 4.1
Sodium: 136
Sodium: 138
Sodium: 142

## 2010-12-24 LAB — URINE CULTURE

## 2010-12-24 LAB — URINALYSIS, ROUTINE W REFLEX MICROSCOPIC
Nitrite: NEGATIVE
Protein, ur: >300 — AB
Specific Gravity, Urine: 1.011
Urobilinogen, UA: 0.2

## 2010-12-24 LAB — TYPE AND SCREEN: ABO/RH(D): A NEG

## 2010-12-24 LAB — CULTURE, BLOOD (ROUTINE X 2)
Culture: NO GROWTH
Culture: NO GROWTH

## 2010-12-24 LAB — CREATININE, URINE, RANDOM: Creatinine, Urine: 99.7

## 2010-12-24 LAB — HEMOGLOBIN AND HEMATOCRIT, BLOOD: Hemoglobin: 11.4 — ABNORMAL LOW

## 2010-12-24 LAB — POCT I-STAT CREATININE: Operator id: 198171

## 2010-12-24 LAB — URINE MICROSCOPIC-ADD ON

## 2010-12-24 LAB — PROTIME-INR: INR: 1.6 — ABNORMAL HIGH

## 2010-12-30 LAB — COMPREHENSIVE METABOLIC PANEL
ALT: 36
ALT: 41
ALT: 42
ALT: 43
ALT: 43
AST: 38 — ABNORMAL HIGH
AST: 40 — ABNORMAL HIGH
AST: 41 — ABNORMAL HIGH
AST: 43 — ABNORMAL HIGH
AST: 48 — ABNORMAL HIGH
AST: 51 — ABNORMAL HIGH
AST: 52 — ABNORMAL HIGH
AST: 53 — ABNORMAL HIGH
Albumin: 1.4 — ABNORMAL LOW
Albumin: 1.5 — ABNORMAL LOW
Albumin: 1.5 — ABNORMAL LOW
Albumin: 1.5 — ABNORMAL LOW
Albumin: 1.5 — ABNORMAL LOW
Albumin: 1.6 — ABNORMAL LOW
Albumin: 1.7 — ABNORMAL LOW
Albumin: 1.8 — ABNORMAL LOW
Albumin: 1.9 — ABNORMAL LOW
Albumin: 2 — ABNORMAL LOW
Alkaline Phosphatase: 108
Alkaline Phosphatase: 113
Alkaline Phosphatase: 134 — ABNORMAL HIGH
Alkaline Phosphatase: 139 — ABNORMAL HIGH
Alkaline Phosphatase: 139 — ABNORMAL HIGH
BUN: 19
BUN: 27 — ABNORMAL HIGH
BUN: 28 — ABNORMAL HIGH
BUN: 29 — ABNORMAL HIGH
BUN: 48 — ABNORMAL HIGH
BUN: 56 — ABNORMAL HIGH
BUN: 65 — ABNORMAL HIGH
CO2: 21
CO2: 21
CO2: 22
Calcium: 6.8 — ABNORMAL LOW
Calcium: 7 — ABNORMAL LOW
Calcium: 7.2 — ABNORMAL LOW
Calcium: 7.9 — ABNORMAL LOW
Calcium: 8.2 — ABNORMAL LOW
Calcium: 8.4
Chloride: 105
Chloride: 107
Chloride: 107
Chloride: 108
Chloride: 109
Chloride: 109
Creatinine, Ser: 2 — ABNORMAL HIGH
Creatinine, Ser: 2.02 — ABNORMAL HIGH
Creatinine, Ser: 2.16 — ABNORMAL HIGH
Creatinine, Ser: 3.87 — ABNORMAL HIGH
Creatinine, Ser: 4.33 — ABNORMAL HIGH
Creatinine, Ser: 4.4 — ABNORMAL HIGH
Creatinine, Ser: 5.39 — ABNORMAL HIGH
GFR calc Af Amer: 13 — ABNORMAL LOW
GFR calc Af Amer: 15 — ABNORMAL LOW
GFR calc Af Amer: 17 — ABNORMAL LOW
GFR calc Af Amer: 19 — ABNORMAL LOW
GFR calc Af Amer: 26 — ABNORMAL LOW
GFR calc Af Amer: 28 — ABNORMAL LOW
GFR calc Af Amer: 41 — ABNORMAL LOW
GFR calc non Af Amer: 13 — ABNORMAL LOW
GFR calc non Af Amer: 13 — ABNORMAL LOW
GFR calc non Af Amer: 14 — ABNORMAL LOW
GFR calc non Af Amer: 22 — ABNORMAL LOW
GFR calc non Af Amer: 31 — ABNORMAL LOW
Glucose, Bld: 101 — ABNORMAL HIGH
Glucose, Bld: 114 — ABNORMAL HIGH
Glucose, Bld: 82
Glucose, Bld: 83
Glucose, Bld: 85
Glucose, Bld: 88
Potassium: 3.1 — ABNORMAL LOW
Potassium: 3.1 — ABNORMAL LOW
Potassium: 3.7
Potassium: 3.8
Potassium: 3.8
Potassium: 4
Sodium: 133 — ABNORMAL LOW
Sodium: 139
Sodium: 139
Sodium: 140
Sodium: 141
Sodium: 142
Sodium: 143
Total Bilirubin: 3.6 — ABNORMAL HIGH
Total Bilirubin: 4 — ABNORMAL HIGH
Total Bilirubin: 4.5 — ABNORMAL HIGH
Total Bilirubin: 5.7 — ABNORMAL HIGH
Total Bilirubin: 5.7 — ABNORMAL HIGH
Total Bilirubin: 5.9 — ABNORMAL HIGH
Total Protein: 5.6 — ABNORMAL LOW
Total Protein: 6.1
Total Protein: 6.3
Total Protein: 6.5
Total Protein: 7.1
Total Protein: 7.2
Total Protein: 7.4
Total Protein: 7.4

## 2010-12-30 LAB — CBC
HCT: 21.6 — ABNORMAL LOW
HCT: 27.4 — ABNORMAL LOW
HCT: 28.1 — ABNORMAL LOW
HCT: 30.8 — ABNORMAL LOW
HCT: 31.3 — ABNORMAL LOW
HCT: 33.8 — ABNORMAL LOW
Hemoglobin: 11 — ABNORMAL LOW
Hemoglobin: 7 — CL
Hemoglobin: 9 — ABNORMAL LOW
Hemoglobin: 9.2 — ABNORMAL LOW
MCHC: 32.1
MCHC: 32.4
MCHC: 32.5
MCHC: 32.7
MCHC: 33
MCHC: 33.2
MCV: 66.8 — ABNORMAL LOW
MCV: 68.3 — ABNORMAL LOW
MCV: 70.4 — ABNORMAL LOW
MCV: 70.8 — ABNORMAL LOW
MCV: 71.4 — ABNORMAL LOW
MCV: 72.1 — ABNORMAL LOW
MCV: 73.3 — ABNORMAL LOW
Platelets: 181
Platelets: 191
Platelets: 233
Platelets: 281
Platelets: 313
Platelets: 316
RBC: 3.16 — ABNORMAL LOW
RBC: 3.96 — ABNORMAL LOW
RBC: 4.06 — ABNORMAL LOW
RBC: 4.34
RBC: 4.68
RDW: 15.5 — ABNORMAL HIGH
RDW: 16 — ABNORMAL HIGH
RDW: 17.4 — ABNORMAL HIGH
RDW: 18.1 — ABNORMAL HIGH
RDW: 18.2 — ABNORMAL HIGH
RDW: 18.8 — ABNORMAL HIGH
WBC: 20.1 — ABNORMAL HIGH
WBC: 20.7 — ABNORMAL HIGH
WBC: 36.5 — ABNORMAL HIGH
WBC: 42.9 — ABNORMAL HIGH

## 2010-12-30 LAB — POCT I-STAT 7, (LYTES, BLD GAS, ICA,H+H)
Acid-base deficit: 6 — ABNORMAL HIGH
Bicarbonate: 18 — ABNORMAL LOW
Calcium, Ion: 0.94 — ABNORMAL LOW
HCT: 22 — ABNORMAL LOW
Hemoglobin: 7.5 — CL
O2 Saturation: 100
Operator id: 248711
Patient temperature: 37.4
Potassium: 4.4
Sodium: 140
TCO2: 19
pCO2 arterial: 31 — ABNORMAL LOW
pH, Arterial: 7.374
pO2, Arterial: 184 — ABNORMAL HIGH

## 2010-12-30 LAB — CARDIAC PANEL(CRET KIN+CKTOT+MB+TROPI)
CK, MB: 10.2 — ABNORMAL HIGH
CK, MB: 12.5 — ABNORMAL HIGH
Relative Index: 2.7 — ABNORMAL HIGH
Relative Index: 2.9 — ABNORMAL HIGH
Relative Index: INVALID
Total CK: 356 — ABNORMAL HIGH
Total CK: 455 — ABNORMAL HIGH
Total CK: 67
Troponin I: 0.52
Troponin I: 0.67

## 2010-12-30 LAB — CROSSMATCH

## 2010-12-30 LAB — CARBOXYHEMOGLOBIN
Carboxyhemoglobin: 1.4
Carboxyhemoglobin: 1.6 — ABNORMAL HIGH
Methemoglobin: 0.9
Methemoglobin: 1
Methemoglobin: 1.1
Methemoglobin: 1.2
O2 Saturation: 46.5
O2 Saturation: 56.4
O2 Saturation: 56.5
O2 Saturation: 63.4
Total hemoglobin: 6.7 — CL
Total hemoglobin: 8.9 — ABNORMAL LOW
Total hemoglobin: 9.2 — ABNORMAL LOW
Total hemoglobin: 9.7 — ABNORMAL LOW

## 2010-12-30 LAB — HEPATIC FUNCTION PANEL
AST: 36
Albumin: 1.7 — ABNORMAL LOW
Bilirubin, Direct: 0.8 — ABNORMAL HIGH
Total Protein: 6.2

## 2010-12-30 LAB — POCT I-STAT 3, ART BLOOD GAS (G3+)
Acid-base deficit: 7 — ABNORMAL HIGH
Acid-base deficit: 7 — ABNORMAL HIGH
Bicarbonate: 16.1 — ABNORMAL LOW
O2 Saturation: 84
Operator id: 248711
pH, Arterial: 7.413
pO2, Arterial: 59 — ABNORMAL LOW

## 2010-12-30 LAB — CULTURE, BLOOD (ROUTINE X 2)
Culture: NO GROWTH
Culture: NO GROWTH

## 2010-12-30 LAB — STOOL CULTURE

## 2010-12-30 LAB — I-STAT 8, (EC8 V) (CONVERTED LAB)
Acid-base deficit: 5 — ABNORMAL HIGH
BUN: 67 — ABNORMAL HIGH
Chloride: 103
HCT: 30 — ABNORMAL LOW
Hemoglobin: 10.2 — ABNORMAL LOW
Operator id: 285491
Potassium: 4.2

## 2010-12-30 LAB — BASIC METABOLIC PANEL
BUN: 17
BUN: 20
BUN: 64 — ABNORMAL HIGH
CO2: 18 — ABNORMAL LOW
CO2: 19
CO2: 19
CO2: 24
Calcium: 8.5
Chloride: 109
Chloride: 105
Chloride: 107
Chloride: 109
Chloride: 110
Creatinine, Ser: 1.97 — ABNORMAL HIGH
Creatinine, Ser: 6.93 — ABNORMAL HIGH
GFR calc Af Amer: 41 — ABNORMAL LOW
GFR calc Af Amer: 23 — ABNORMAL LOW
GFR calc Af Amer: 34 — ABNORMAL LOW
GFR calc non Af Amer: 35 — ABNORMAL LOW
Glucose, Bld: 104 — ABNORMAL HIGH
Potassium: 3.3 — ABNORMAL LOW
Potassium: 3.3 — ABNORMAL LOW
Sodium: 136

## 2010-12-30 LAB — FECAL LACTOFERRIN, QUANT

## 2010-12-30 LAB — DIFFERENTIAL
Basophils Absolute: 0
Basophils Relative: 0
Lymphocytes Relative: 1 — ABNORMAL LOW
Lymphs Abs: 0.2 — ABNORMAL LOW
Monocytes Relative: 1 — ABNORMAL LOW
Neutro Abs: 19.7 — ABNORMAL HIGH

## 2010-12-30 LAB — URINALYSIS, ROUTINE W REFLEX MICROSCOPIC
Ketones, ur: NEGATIVE
Nitrite: NEGATIVE
Protein, ur: 100 — AB
Urobilinogen, UA: 0.2

## 2010-12-30 LAB — CK TOTAL AND CKMB (NOT AT ARMC)
CK, MB: 7.3 — ABNORMAL HIGH
Relative Index: 1.9

## 2010-12-30 LAB — LIPASE, BLOOD
Lipase: 48
Lipase: <10 — ABNORMAL LOW

## 2010-12-30 LAB — D-DIMER, QUANTITATIVE: D-Dimer, Quant: 16.52 — ABNORMAL HIGH

## 2010-12-30 LAB — APTT
aPTT: 37
aPTT: 37
aPTT: 39 — ABNORMAL HIGH

## 2010-12-30 LAB — PROTIME-INR
INR: 1.3
INR: 1.9 — ABNORMAL HIGH

## 2010-12-30 LAB — URINE MICROSCOPIC-ADD ON

## 2010-12-30 LAB — CLOSTRIDIUM DIFFICILE EIA

## 2010-12-30 LAB — PHOSPHORUS: Phosphorus: 3.1

## 2010-12-30 LAB — TROPONIN I: Troponin I: 0.21 — ABNORMAL HIGH

## 2010-12-30 LAB — POCT I-STAT CREATININE: Creatinine, Ser: 7.7 — ABNORMAL HIGH

## 2011-01-20 ENCOUNTER — Encounter: Payer: Self-pay | Admitting: Family Medicine

## 2013-10-06 ENCOUNTER — Encounter (HOSPITAL_COMMUNITY): Payer: Self-pay | Admitting: Emergency Medicine

## 2013-10-06 ENCOUNTER — Inpatient Hospital Stay (HOSPITAL_COMMUNITY)
Admission: EM | Admit: 2013-10-06 | Discharge: 2013-10-10 | DRG: 871 | Disposition: A | Discharge status: Home Health | Payer: Medicare Other | Attending: Internal Medicine | Admitting: Internal Medicine

## 2013-10-06 ENCOUNTER — Emergency Department (HOSPITAL_COMMUNITY): Payer: Medicare Other

## 2013-10-06 DIAGNOSIS — J449 Chronic obstructive pulmonary disease, unspecified: Secondary | ICD-10-CM | POA: Diagnosis present

## 2013-10-06 DIAGNOSIS — N183 Chronic kidney disease, stage 3 unspecified: Secondary | ICD-10-CM | POA: Diagnosis present

## 2013-10-06 DIAGNOSIS — K219 Gastro-esophageal reflux disease without esophagitis: Secondary | ICD-10-CM | POA: Diagnosis present

## 2013-10-06 DIAGNOSIS — N1330 Unspecified hydronephrosis: Secondary | ICD-10-CM | POA: Diagnosis present

## 2013-10-06 DIAGNOSIS — N1339 Other hydronephrosis: Secondary | ICD-10-CM

## 2013-10-06 DIAGNOSIS — F17200 Nicotine dependence, unspecified, uncomplicated: Secondary | ICD-10-CM | POA: Diagnosis present

## 2013-10-06 DIAGNOSIS — N401 Enlarged prostate with lower urinary tract symptoms: Secondary | ICD-10-CM

## 2013-10-06 DIAGNOSIS — Z79899 Other long term (current) drug therapy: Secondary | ICD-10-CM | POA: Diagnosis not present

## 2013-10-06 DIAGNOSIS — E872 Acidosis, unspecified: Secondary | ICD-10-CM | POA: Diagnosis present

## 2013-10-06 DIAGNOSIS — Z681 Body mass index (BMI) 19 or less, adult: Secondary | ICD-10-CM

## 2013-10-06 DIAGNOSIS — C189 Malignant neoplasm of colon, unspecified: Secondary | ICD-10-CM

## 2013-10-06 DIAGNOSIS — N390 Urinary tract infection, site not specified: Secondary | ICD-10-CM | POA: Diagnosis present

## 2013-10-06 DIAGNOSIS — N19 Unspecified kidney failure: Secondary | ICD-10-CM | POA: Diagnosis present

## 2013-10-06 DIAGNOSIS — N400 Enlarged prostate without lower urinary tract symptoms: Secondary | ICD-10-CM | POA: Diagnosis present

## 2013-10-06 DIAGNOSIS — A419 Sepsis, unspecified organism: Secondary | ICD-10-CM | POA: Diagnosis present

## 2013-10-06 DIAGNOSIS — A4151 Sepsis due to Escherichia coli [E. coli]: Secondary | ICD-10-CM | POA: Diagnosis present

## 2013-10-06 DIAGNOSIS — R5383 Other fatigue: Secondary | ICD-10-CM | POA: Diagnosis not present

## 2013-10-06 DIAGNOSIS — E860 Dehydration: Secondary | ICD-10-CM | POA: Diagnosis present

## 2013-10-06 DIAGNOSIS — E43 Unspecified severe protein-calorie malnutrition: Secondary | ICD-10-CM | POA: Diagnosis present

## 2013-10-06 DIAGNOSIS — N135 Crossing vessel and stricture of ureter without hydronephrosis: Secondary | ICD-10-CM | POA: Diagnosis present

## 2013-10-06 DIAGNOSIS — J4489 Other specified chronic obstructive pulmonary disease: Secondary | ICD-10-CM | POA: Diagnosis present

## 2013-10-06 DIAGNOSIS — Z85038 Personal history of other malignant neoplasm of large intestine: Secondary | ICD-10-CM | POA: Diagnosis not present

## 2013-10-06 DIAGNOSIS — IMO0002 Reserved for concepts with insufficient information to code with codable children: Secondary | ICD-10-CM | POA: Diagnosis present

## 2013-10-06 DIAGNOSIS — N2889 Other specified disorders of kidney and ureter: Secondary | ICD-10-CM | POA: Diagnosis present

## 2013-10-06 DIAGNOSIS — N12 Tubulo-interstitial nephritis, not specified as acute or chronic: Secondary | ICD-10-CM

## 2013-10-06 DIAGNOSIS — R627 Adult failure to thrive: Secondary | ICD-10-CM | POA: Diagnosis present

## 2013-10-06 DIAGNOSIS — N138 Other obstructive and reflux uropathy: Secondary | ICD-10-CM | POA: Diagnosis present

## 2013-10-06 DIAGNOSIS — N179 Acute kidney failure, unspecified: Secondary | ICD-10-CM | POA: Diagnosis present

## 2013-10-06 DIAGNOSIS — R5381 Other malaise: Secondary | ICD-10-CM | POA: Diagnosis present

## 2013-10-06 HISTORY — DX: Chronic obstructive pulmonary disease, unspecified: J44.9

## 2013-10-06 LAB — CBC WITH DIFFERENTIAL/PLATELET
BASOS ABS: 0 10*3/uL (ref 0.0–0.1)
Basophils Relative: 0 % (ref 0–1)
Eosinophils Absolute: 0 10*3/uL (ref 0.0–0.7)
Eosinophils Relative: 0 % (ref 0–5)
HCT: 37.6 % — ABNORMAL LOW (ref 39.0–52.0)
HEMOGLOBIN: 12.2 g/dL — AB (ref 13.0–17.0)
LYMPHS PCT: 12 % (ref 12–46)
Lymphs Abs: 1.8 10*3/uL (ref 0.7–4.0)
MCH: 24.6 pg — AB (ref 26.0–34.0)
MCHC: 32.4 g/dL (ref 30.0–36.0)
MCV: 75.8 fL — ABNORMAL LOW (ref 78.0–100.0)
MONO ABS: 1.4 10*3/uL — AB (ref 0.1–1.0)
Monocytes Relative: 9 % (ref 3–12)
Neutro Abs: 12.1 10*3/uL — ABNORMAL HIGH (ref 1.7–7.7)
Neutrophils Relative %: 79 % — ABNORMAL HIGH (ref 43–77)
PLATELETS: 418 10*3/uL — AB (ref 150–400)
RBC: 4.96 MIL/uL (ref 4.22–5.81)
RDW: 14.8 % (ref 11.5–15.5)
WBC: 15.3 10*3/uL — ABNORMAL HIGH (ref 4.0–10.5)

## 2013-10-06 LAB — URINE MICROSCOPIC-ADD ON

## 2013-10-06 LAB — URINALYSIS, ROUTINE W REFLEX MICROSCOPIC
Bilirubin Urine: NEGATIVE
Glucose, UA: NEGATIVE mg/dL
Ketones, ur: NEGATIVE mg/dL
NITRITE: NEGATIVE
Protein, ur: >300 mg/dL — AB
SPECIFIC GRAVITY, URINE: 1.017 (ref 1.005–1.030)
Urobilinogen, UA: 0.2 mg/dL (ref 0.0–1.0)
pH: 8 (ref 5.0–8.0)

## 2013-10-06 LAB — COMPREHENSIVE METABOLIC PANEL
ALBUMIN: 2.9 g/dL — AB (ref 3.5–5.2)
ALT: 26 U/L (ref 0–53)
AST: 25 U/L (ref 0–37)
Alkaline Phosphatase: 118 U/L — ABNORMAL HIGH (ref 39–117)
Anion gap: 23 — ABNORMAL HIGH (ref 5–15)
BUN: 96 mg/dL — ABNORMAL HIGH (ref 6–23)
CALCIUM: 10.4 mg/dL (ref 8.4–10.5)
CO2: 11 meq/L — AB (ref 19–32)
CREATININE: 5.45 mg/dL — AB (ref 0.50–1.35)
Chloride: 100 mEq/L (ref 96–112)
GFR calc Af Amer: 11 mL/min — ABNORMAL LOW (ref >90)
GFR, EST NON AFRICAN AMERICAN: 9 mL/min — AB (ref >90)
Glucose, Bld: 105 mg/dL — ABNORMAL HIGH (ref 70–99)
Potassium: 4.9 mEq/L (ref 3.7–5.3)
SODIUM: 134 meq/L — AB (ref 137–147)
TOTAL PROTEIN: 10.1 g/dL — AB (ref 6.0–8.3)
Total Bilirubin: 0.2 mg/dL — ABNORMAL LOW (ref 0.3–1.2)

## 2013-10-06 LAB — BLOOD GAS, ARTERIAL
Acid-base deficit: 16.3 mmol/L — ABNORMAL HIGH (ref 0.0–2.0)
Bicarbonate: 8.5 mEq/L — ABNORMAL LOW (ref 20.0–24.0)
DRAWN BY: 308601
FIO2: 0.21 %
O2 Saturation: 97.2 %
PCO2 ART: 17.8 mmHg — AB (ref 35.0–45.0)
PO2 ART: 110 mmHg — AB (ref 80.0–100.0)
Patient temperature: 98.6
TCO2: 8 mmol/L (ref 0–100)
pH, Arterial: 7.302 — ABNORMAL LOW (ref 7.350–7.450)

## 2013-10-06 LAB — TROPONIN I

## 2013-10-06 LAB — PRO B NATRIURETIC PEPTIDE: PRO B NATRI PEPTIDE: 1216 pg/mL — AB (ref 0–125)

## 2013-10-06 LAB — POC OCCULT BLOOD, ED: FECAL OCCULT BLD: NEGATIVE

## 2013-10-06 LAB — I-STAT CG4 LACTIC ACID, ED: Lactic Acid, Venous: 1.37 mmol/L (ref 0.5–2.2)

## 2013-10-06 MED ORDER — SODIUM CHLORIDE 0.9 % IV BOLUS (SEPSIS)
1000.0000 mL | Freq: Once | INTRAVENOUS | Status: AC
  Administered 2013-10-06: 1000 mL via INTRAVENOUS

## 2013-10-06 MED ORDER — SODIUM CHLORIDE 0.9 % IV BOLUS (SEPSIS)
500.0000 mL | Freq: Once | INTRAVENOUS | Status: AC
  Administered 2013-10-06: 500 mL via INTRAVENOUS

## 2013-10-06 MED ORDER — ACETAMINOPHEN 325 MG PO TABS
650.0000 mg | ORAL_TABLET | Freq: Once | ORAL | Status: AC
Start: 2013-10-06 — End: 2013-10-06
  Administered 2013-10-06: 650 mg via ORAL
  Filled 2013-10-06: qty 2

## 2013-10-06 MED ORDER — DEXTROSE 5 % IV SOLN
1.0000 g | Freq: Once | INTRAVENOUS | Status: AC
  Administered 2013-10-06: 1 g via INTRAVENOUS
  Filled 2013-10-06: qty 10

## 2013-10-06 NOTE — ED Provider Notes (Signed)
CSN: [id number]     Arrival date & time 10/06/13  2109 History   First MD Initiated Contact with Patient 10/06/13 2110     Chief Complaint  Patient presents with  . Weakness     (Consider location/radiation/quality/duration/timing/severity/associated sxs/prior Treatment) HPI  Patient presents with 6 days of generalized weakness, SOB, decreased appetite.  States he got off the bus 6 days ago, got too hot being outside and went home - has had all of his symptoms since this point.  Reports he is drinking fluids but does not feel like eating.  Has also had increased SOB, some wheezing - used his albuterol nebulizer for the first time in 20 years yesterday but states it did not help.  Denies fevers, myalgias, CP, cough, abdominal pain, N/V/D, urinary symptoms.  Has indwelling foley catheter x 7 years, scheduled to have it changed next week.  Has not noticed change in foley output. Notes it usually isn't bloody though as it is now.   Past Medical History  Diagnosis Date  . COPD (chronic obstructive pulmonary disease)    Past Surgical History  Procedure Laterality Date  . Abdominal surgery     No family history on file. History  Substance Use Topics  . Smoking status: Current Every Day Smoker  . Smokeless tobacco: Not on file  . Alcohol Use: Yes    Review of Systems  All other systems reviewed and are negative.     Allergies  Review of patient's allergies indicates not on file.  Home Medications   Prior to Admission medications   Not on File   BP 91/54  Pulse 120  Temp(Src) 97.6 F (36.4 C) (Oral)  Resp 18  SpO2 99% Physical Exam  Nursing note and vitals reviewed. Constitutional: He appears cachectic. He is active.  Non-toxic appearance. No distress.  HENT:  Head: Normocephalic and atraumatic.  Neck: Normal range of motion. Neck supple.  Cardiovascular: Normal rate and regular rhythm.   Pulmonary/Chest: Effort normal and breath sounds normal. No respiratory distress.  He has no wheezes. He has no rales.  Abdominal: Soft. He exhibits no distension and no mass. There is no tenderness. There is no rebound and no guarding.  Musculoskeletal: He exhibits no edema and no tenderness.  Neurological: He is alert. He exhibits normal muscle tone.  Skin: He is not diaphoretic.    ED Course  Procedures (including critical care time) Labs Review Labs Reviewed  CBC WITH DIFFERENTIAL - Abnormal; Notable for the following:    WBC 15.3 (*)    Hemoglobin 12.2 (*)    HCT 37.6 (*)    MCV 75.8 (*)    MCH 24.6 (*)    Platelets 418 (*)    Neutrophils Relative % 79 (*)    Neutro Abs 12.1 (*)    Monocytes Absolute 1.4 (*)    All other components within normal limits  COMPREHENSIVE METABOLIC PANEL - Abnormal; Notable for the following:    Sodium 134 (*)    CO2 11 (*)    Glucose, Bld 105 (*)    BUN 96 (*)    Creatinine, Ser 5.45 (*)    Total Protein 10.1 (*)    Albumin 2.9 (*)    Alkaline Phosphatase 118 (*)    Total Bilirubin 0.2 (*)    GFR calc non Af Amer 9 (*)    GFR calc Af Amer 11 (*)    Anion gap 23 (*)    All other components within normal limits  URINALYSIS, ROUTINE W REFLEX MICROSCOPIC - Abnormal; Notable for the following:    APPearance TURBID (*)    Hgb urine dipstick SMALL (*)    Protein, ur >300 (*)    Leukocytes, UA LARGE (*)    All other components within normal limits  URINE MICROSCOPIC-ADD ON - Abnormal; Notable for the following:    Bacteria, UA MANY (*)    All other components within normal limits  URINE CULTURE  TROPONIN I  PRO B NATRIURETIC PEPTIDE  OCCULT BLOOD X 1 CARD TO LAB, STOOL  BLOOD GAS, ARTERIAL  I-STAT CG4 LACTIC ACID, ED  POC OCCULT BLOOD, ED    Imaging Review Dg Chest 2 View  10/06/2013   CLINICAL DATA:  Weakness and shortness of breath.  EXAM: CHEST  2 VIEW  COMPARISON:  Chest radiograph performed 10/10/2006  FINDINGS: The lungs are well-aerated and clear. There is no evidence of focal opacification, pleural effusion  or pneumothorax.  The heart is normal in size; the mediastinal contour is within normal limits. Borderline right hilar prominence appears stable and reflects the patient's baseline. No acute osseous abnormalities are seen. A right-sided nipple shadow is noted.  IMPRESSION: No acute cardiopulmonary process seen.   Electronically Signed   By: Garald Balding M.D.   On: 10/06/2013 22:20     EKG Interpretation None      Filed Vitals:   10/06/13 2120  BP: 91/54  Pulse: 120  Temp: 97.6 F (36.4 C)  Resp: 18     9:56 PM Discussed pt with Dr Dina Rich.   Filed Vitals:   10/06/13 2300  BP: 119/89  Pulse:   Temp:   Resp: 22     MDM   Final diagnoses:  Renal failure  Dehydration  UTI (lower urinary tract infection)    Afebrile nontoxic patient who present with generalized weakness and SOB, found to be tachycardic, hypotensive on arrival.  Not eating much over the past week.  IVF given.  CXR negative  Labs show leukocytosis, renal failure (BUN creat 96 and 5.4), anion gap 23.  UA is from foley but is leukocyte positive, WBC and RBC TNTC, many bacteria.  Pt has indwelling foley, due to be changed after 5 weeks. Presumed UTI.  Culture pending.  Rocephin ordered.  Tachycardia and hypotension improving with IVF.  Discussed with [facility] Dr Benny Lennert, will add arterial blood gas and renal US.  Holding orders placed.      Clayton Bibles, PA-C 10/06/13 2345

## 2013-10-06 NOTE — ED Notes (Signed)
Bed: WA07 Expected date:  Expected time:  Means of arrival:  Comments: EMS 56M Weakness

## 2013-10-06 NOTE — ED Notes (Signed)
Per EMS, Pt coming from home with weakness and loss of appetite x 2 weeks with weight loss. Pt only able to eat ensure and apple sauce. Pt denies N/V/Diarrhea, except for slight nausea when walking. Pt gets SOB when walking. Denies Fever. A&Ox4. No blood in stool or urine.

## 2013-10-07 ENCOUNTER — Encounter (HOSPITAL_COMMUNITY): Payer: Self-pay | Admitting: Internal Medicine

## 2013-10-07 ENCOUNTER — Inpatient Hospital Stay (HOSPITAL_COMMUNITY): Payer: Medicare Other

## 2013-10-07 DIAGNOSIS — R627 Adult failure to thrive: Secondary | ICD-10-CM

## 2013-10-07 DIAGNOSIS — J449 Chronic obstructive pulmonary disease, unspecified: Secondary | ICD-10-CM

## 2013-10-07 DIAGNOSIS — E872 Acidosis, unspecified: Secondary | ICD-10-CM

## 2013-10-07 DIAGNOSIS — N390 Urinary tract infection, site not specified: Secondary | ICD-10-CM

## 2013-10-07 LAB — CBC
HEMATOCRIT: 32.4 % — AB (ref 39.0–52.0)
HEMOGLOBIN: 10.3 g/dL — AB (ref 13.0–17.0)
MCH: 23.9 pg — ABNORMAL LOW (ref 26.0–34.0)
MCHC: 31.8 g/dL (ref 30.0–36.0)
MCV: 75.2 fL — ABNORMAL LOW (ref 78.0–100.0)
Platelets: 384 10*3/uL (ref 150–400)
RBC: 4.31 MIL/uL (ref 4.22–5.81)
RDW: 14.8 % (ref 11.5–15.5)
WBC: 13 10*3/uL — ABNORMAL HIGH (ref 4.0–10.5)

## 2013-10-07 LAB — BASIC METABOLIC PANEL
ANION GAP: 20 — AB (ref 5–15)
BUN: 96 mg/dL — ABNORMAL HIGH (ref 6–23)
CO2: 10 meq/L — AB (ref 19–32)
CREATININE: 5.05 mg/dL — AB (ref 0.50–1.35)
Calcium: 9.1 mg/dL (ref 8.4–10.5)
Chloride: 104 mEq/L (ref 96–112)
GFR calc Af Amer: 12 mL/min — ABNORMAL LOW (ref >90)
GFR calc non Af Amer: 10 mL/min — ABNORMAL LOW (ref >90)
Glucose, Bld: 97 mg/dL (ref 70–99)
POTASSIUM: 5.1 meq/L (ref 3.7–5.3)
Sodium: 134 mEq/L — ABNORMAL LOW (ref 137–147)

## 2013-10-07 MED ORDER — ENSURE COMPLETE PO LIQD
237.0000 mL | Freq: Three times a day (TID) | ORAL | Status: DC
  Administered 2013-10-07 – 2013-10-08 (×4): 237 mL via ORAL

## 2013-10-07 MED ORDER — ONDANSETRON HCL 4 MG/2ML IJ SOLN: 4.0000 mg | Freq: Three times a day (TID) | INTRAMUSCULAR | Status: AC | PRN

## 2013-10-07 MED ORDER — HYDROCORTISONE 1 % EX CREA
1.0000 "application " | TOPICAL_CREAM | Freq: Two times a day (BID) | CUTANEOUS | Status: DC
  Administered 2013-10-07 – 2013-10-09 (×3): 1 via TOPICAL
  Filled 2013-10-07: qty 28

## 2013-10-07 MED ORDER — HYDROCODONE-ACETAMINOPHEN 5-325 MG PO TABS
1.0000 | ORAL_TABLET | ORAL | Status: DC | PRN
  Administered 2013-10-09: 1 via ORAL
  Filled 2013-10-07 (×2): qty 1

## 2013-10-07 MED ORDER — ZOLPIDEM TARTRATE 5 MG PO TABS: 5.0000 mg | ORAL_TABLET | Freq: Every evening | ORAL | Status: DC | PRN

## 2013-10-07 MED ORDER — HEPARIN SODIUM (PORCINE) 5000 UNIT/ML IJ SOLN
5000.0000 [IU] | Freq: Three times a day (TID) | INTRAMUSCULAR | Status: DC
  Administered 2013-10-07 – 2013-10-10 (×12): 5000 [IU] via SUBCUTANEOUS
  Filled 2013-10-07 (×14): qty 1

## 2013-10-07 MED ORDER — ACETAMINOPHEN 325 MG PO TABS
650.0000 mg | ORAL_TABLET | Freq: Four times a day (QID) | ORAL | Status: DC | PRN
  Administered 2013-10-07 – 2013-10-08 (×4): 650 mg via ORAL
  Filled 2013-10-07 (×5): qty 2

## 2013-10-07 MED ORDER — SODIUM CHLORIDE 0.9 % IV SOLN: INTRAVENOUS | Status: DC

## 2013-10-07 MED ORDER — SODIUM CHLORIDE 0.9 % IJ SOLN: 3.0000 mL | Freq: Two times a day (BID) | INTRAMUSCULAR | Status: DC

## 2013-10-07 MED ORDER — SODIUM CHLORIDE 0.45 % IV SOLN
INTRAVENOUS | Status: DC
  Administered 2013-10-07 – 2013-10-10 (×9): via INTRAVENOUS
  Filled 2013-10-07 (×20): qty 1000

## 2013-10-07 MED ORDER — ACETAMINOPHEN 650 MG RE SUPP
650.0000 mg | Freq: Four times a day (QID) | RECTAL | Status: DC | PRN
  Filled 2013-10-07: qty 1

## 2013-10-07 MED ORDER — PANTOPRAZOLE SODIUM 40 MG PO TBEC
40.0000 mg | DELAYED_RELEASE_TABLET | Freq: Every day | ORAL | Status: DC
Start: 2013-10-07 — End: 2013-10-11
  Administered 2013-10-07 – 2013-10-10 (×4): 40 mg via ORAL
  Filled 2013-10-07 (×4): qty 1

## 2013-10-07 MED ORDER — DEXTROSE 5 % IV SOLN
1.0000 g | INTRAVENOUS | Status: DC
  Administered 2013-10-07 – 2013-10-09 (×3): 1 g via INTRAVENOUS
  Filled 2013-10-07 (×4): qty 10

## 2013-10-07 NOTE — Progress Notes (Signed)
CRITICAL VALUE ALERT  Critical value received:  CO2-10  Date of notification:  10/07/13  Time of notification:  0200  Critical value read back:Yes.    Nurse who received alert:  Donne Anon  MD notified (1st page):  Hospitalist  Time of first page:  0203

## 2013-10-07 NOTE — H&P (Signed)
[name] is an 73 y.o. male.   Chief Complaint: Weakness and poor PO intake. HPI: Pt is a 73 yr old male who states that his relatives made him come to the ED. They complain that he is weak and has only been able to eat Ensure and applesauce for the past 3 days.  The patient hasn't seen a PCP for several years. He does have a chronic indwelling foley catheter that was placed 8 years ago for obstruction. The patient has a history of BPH and adenocarcinoma of the colon. It is not known what the cause of the obstruction may have been.  He is seen at his urologists office once a week, according to the patient, to change out his indwelling foley.  Past Medical History  Diagnosis Date  . COPD (chronic obstructive pulmonary disease)   BPH History of adenocarcinoma of the colon GERD Hydroureter Pyelonephritis   Past Surgical History  Procedure Laterality Date  . Abdominal surgery    Colon resection  History reviewed. No pertinent family history.Pt denies any history of CAD, DM, CA. Social History:  reports that he has been smoking.  He does not have any smokeless tobacco history on file. He reports that he drinks alcohol. His drug history is not on file.  Allergies: No Known Allergies  Medications Prior to Admission  Medication Sig Dispense Refill  . ferrous sulfate 325 (65 FE) MG tablet Take 325 mg by mouth daily with breakfast.      . hydrocortisone cream 1 % Apply 1 application topically 2 (two) times daily.      Marland Kitchen omeprazole (PRILOSEC) 20 MG capsule Take 20 mg by mouth daily.        Results for orders placed during the hospital encounter of 10/06/13 (from the past 48 hour(s))  CBC WITH DIFFERENTIAL     Status: Abnormal   Collection Time    10/06/13  9:43 PM      Result Value Ref Range   WBC 15.3 (*) 4.0 - 10.5 K/uL   RBC 4.96  4.22 - 5.81 MIL/uL   Hemoglobin 12.2 (*) 13.0 - 17.0 g/dL   HCT 37.6 (*) 39.0 - 52.0 %   MCV 75.8 (*) 78.0 - 100.0 fL   MCH 24.6 (*) 26.0 - 34.0 pg    MCHC 32.4  30.0 - 36.0 g/dL   RDW 14.8  11.5 - 15.5 %   Platelets 418 (*) 150 - 400 K/uL   Neutrophils Relative % 79 (*) 43 - 77 %   Lymphocytes Relative 12  12 - 46 %   Monocytes Relative 9  3 - 12 %   Eosinophils Relative 0  0 - 5 %   Basophils Relative 0  0 - 1 %   Neutro Abs 12.1 (*) 1.7 - 7.7 K/uL   Lymphs Abs 1.8  0.7 - 4.0 K/uL   Monocytes Absolute 1.4 (*) 0.1 - 1.0 K/uL   Eosinophils Absolute 0.0  0.0 - 0.7 K/uL   Basophils Absolute 0.0  0.0 - 0.1 K/uL   WBC Morphology MILD LEFT SHIFT (1-5% METAS, OCC MYELO, OCC BANDS)     Smear Review LARGE PLATELETS PRESENT    COMPREHENSIVE METABOLIC PANEL     Status: Abnormal   Collection Time    10/06/13  9:43 PM      Result Value Ref Range   Sodium 134 (*) 137 - 147 mEq/L   Potassium 4.9  3.7 - 5.3 mEq/L   Chloride 100  96 -  112 mEq/L   CO2 11 (*) 19 - 32 mEq/L   Glucose, Bld 105 (*) 70 - 99 mg/dL   BUN 96 (*) 6 - 23 mg/dL   Creatinine, Ser 5.45 (*) 0.50 - 1.35 mg/dL   Calcium 10.4  8.4 - 10.5 mg/dL   Total Protein 10.1 (*) 6.0 - 8.3 g/dL   Albumin 2.9 (*) 3.5 - 5.2 g/dL   AST 25  0 - 37 U/L   ALT 26  0 - 53 U/L   Alkaline Phosphatase 118 (*) 39 - 117 U/L   Total Bilirubin 0.2 (*) 0.3 - 1.2 mg/dL   GFR calc non Af Amer 9 (*) >90 mL/min   GFR calc Af Amer 11 (*) >90 mL/min   Comment: (NOTE)     The eGFR has been calculated using the CKD EPI equation.     This calculation has not been validated in all clinical situations.     eGFR's persistently <90 mL/min signify possible Chronic Kidney     Disease.   Anion gap 23 (*) 5 - 15   Comment: RESULT CHECKED  TROPONIN I     Status: None   Collection Time    10/06/13  9:43 PM      Result Value Ref Range   Troponin I <0.30  <0.30 ng/mL   Comment:            Due to the release kinetics of cTnI,     a negative result within the first hours     of the onset of symptoms does not rule out     myocardial infarction with certainty.     If myocardial infarction is still suspected,      repeat the test at appropriate intervals.  PRO B NATRIURETIC PEPTIDE     Status: Abnormal   Collection Time    10/06/13  9:43 PM      Result Value Ref Range   Pro B Natriuretic peptide (BNP) 1216.0 (*) 0 - 125 pg/mL  I-STAT CG4 LACTIC ACID, ED     Status: None   Collection Time    10/06/13  9:55 PM      Result Value Ref Range   Lactic Acid, Venous 1.37  0.5 - 2.2 mmol/L  URINALYSIS, ROUTINE W REFLEX MICROSCOPIC     Status: Abnormal   Collection Time    10/06/13 10:04 PM      Result Value Ref Range   Color, Urine YELLOW  YELLOW   APPearance TURBID (*) CLEAR   Specific Gravity, Urine 1.017  1.005 - 1.030   pH 8.0  5.0 - 8.0   Glucose, UA NEGATIVE  NEGATIVE mg/dL   Hgb urine dipstick SMALL (*) NEGATIVE   Bilirubin Urine NEGATIVE  NEGATIVE   Ketones, ur NEGATIVE  NEGATIVE mg/dL   Protein, ur >300 (*) NEGATIVE mg/dL   Urobilinogen, UA 0.2  0.0 - 1.0 mg/dL   Nitrite NEGATIVE  NEGATIVE   Leukocytes, UA LARGE (*) NEGATIVE  URINE MICROSCOPIC-ADD ON     Status: Abnormal   Collection Time    10/06/13 10:04 PM      Result Value Ref Range   WBC, UA FIELD OBSCURED BY RBC'S  <3 WBC/hpf   RBC / HPF FIELD OBSCURED BY WBC'S  <3 RBC/hpf   Bacteria, UA MANY (*) RARE   Urine-Other URINALYSIS PERFORMED ON SUPERNATANT    POC OCCULT BLOOD, ED     Status: None   Collection Time    10/06/13 10:37  PM      Result Value Ref Range   Fecal Occult Bld NEGATIVE  NEGATIVE  BLOOD GAS, ARTERIAL     Status: Abnormal   Collection Time    10/06/13 11:48 PM      Result Value Ref Range   FIO2 0.21     Delivery systems ROOM AIR     pH, Arterial 7.302 (*) 7.350 - 7.450   pCO2 arterial 17.8 (*) 35.0 - 45.0 mmHg   Comment: CRITICAL RESULT CALLED TO, READ BACK BY AND VERIFIED WITH:      Vonna Kotyk, RN AT [id number] BY JESSICA NEUGENT,RRT,RCP ON 10/06/2013   pO2, Arterial 110.0 (*) 80.0 - 100.0 mmHg   Bicarbonate 8.5 (*) 20.0 - 24.0 mEq/L   TCO2 8.0  0 - 100 mmol/L   Acid-base deficit 16.3 (*) 0.0 - 2.0 mmol/L    O2 Saturation 97.2     Patient temperature 98.6     Collection site RIGHT BRACHIAL     Drawn by [id number]     Sample type ARTERIAL DRAW    BASIC METABOLIC PANEL     Status: Abnormal   Collection Time    10/07/13  1:15 AM      Result Value Ref Range   Sodium 134 (*) 137 - 147 mEq/L   Potassium 5.1  3.7 - 5.3 mEq/L   Chloride 104  96 - 112 mEq/L   CO2 10 (*) 19 - 32 mEq/L   Comment: CRITICAL RESULT CALLED TO, READ BACK BY AND VERIFIED WITH:     REINERT,L/4W $RemoveBefor'@0200'cmYdFWmLatbQ$  ON 10/07/13 BY KARCZEWSKI,S.   Glucose, Bld 97  70 - 99 mg/dL   BUN 96 (*) 6 - 23 mg/dL   Creatinine, Ser 5.05 (*) 0.50 - 1.35 mg/dL   Calcium 9.1  8.4 - 10.5 mg/dL   GFR calc non Af Amer 10 (*) >90 mL/min   GFR calc Af Amer 12 (*) >90 mL/min   Comment: (NOTE)     The eGFR has been calculated using the CKD EPI equation.     This calculation has not been validated in all clinical situations.     eGFR's persistently <90 mL/min signify possible Chronic Kidney     Disease.   Anion gap 20 (*) 5 - 15  CBC     Status: Abnormal   Collection Time    10/07/13  1:15 AM      Result Value Ref Range   WBC 13.0 (*) 4.0 - 10.5 K/uL   RBC 4.31  4.22 - 5.81 MIL/uL   Hemoglobin 10.3 (*) 13.0 - 17.0 g/dL   HCT 32.4 (*) 39.0 - 52.0 %   MCV 75.2 (*) 78.0 - 100.0 fL   MCH 23.9 (*) 26.0 - 34.0 pg   MCHC 31.8  30.0 - 36.0 g/dL   RDW 14.8  11.5 - 15.5 %   Platelets 384  150 - 400 K/uL   Dg Chest 2 View  10/06/2013   CLINICAL DATA:  Weakness and shortness of breath.  EXAM: CHEST  2 VIEW  COMPARISON:  Chest radiograph performed 10/10/2006  FINDINGS: The lungs are well-aerated and clear. There is no evidence of focal opacification, pleural effusion or pneumothorax.  The heart is normal in size; the mediastinal contour is within normal limits. Borderline right hilar prominence appears stable and reflects the patient's baseline. No acute osseous abnormalities are seen. A right-sided nipple shadow is noted.  IMPRESSION: No acute cardiopulmonary process  seen.   Electronically Signed  By: Garald Balding M.D.   On: 10/06/2013 22:20    Review of Systems  Constitutional: Negative for fever, chills and malaise/fatigue.       Pt is cachectic, unkempt, and foul smelling.  He denies any problems at all. He is awake alert and oriented x 3, but has no insight into his well-being. He states that his relative made him come to the emergency department. When I asked him if he'd been weak, he stated, "Well, I guess lazy is more like it." His history including ROS is not reliable.  HENT: Negative for congestion and sore throat.   Eyes: Negative for blurred vision, double vision and redness.  Respiratory: Negative for cough, hemoptysis, sputum production, shortness of breath, wheezing and stridor.   Cardiovascular: Positive for leg swelling. Negative for chest pain, palpitations and orthopnea.  Gastrointestinal: Negative for heartburn, nausea, vomiting, abdominal pain, diarrhea, constipation, blood in stool and melena.  Genitourinary: Negative for dysuria, urgency, frequency, hematuria and flank pain.  Musculoskeletal: Negative for back pain, joint pain and myalgias.  Skin: Negative for itching and rash.  Neurological: Negative for dizziness, focal weakness, seizures, loss of consciousness, weakness and headaches.  Endo/Heme/Allergies: Negative for environmental allergies. Does not bruise/bleed easily.  Psychiatric/Behavioral: Negative for depression and hallucinations. The patient is not nervous/anxious.     Blood pressure 115/72, pulse 100, temperature 97.6 F (36.4 C), temperature source Oral, resp. rate 20, height $RemoveBe'6\' 6"'gDuCVdviL$  (1.981 m), weight 56.2 kg (123 lb 14.4 oz), SpO2 97.00%. Physical Exam  Constitutional: He is oriented to person, place, and time.  Pt is cachectic, chronically, and acutely ill appearing. He is an exceeding unreliable historian. He is awake and alert and oriented x 3.   HENT:  Head: Normocephalic and atraumatic.  Nose: Nose normal.   Mouth/Throat: Oropharynx is clear and moist. No oropharyngeal exudate.  Eyes: Conjunctivae and EOM are normal. Pupils are equal, round, and reactive to light. Right eye exhibits no discharge. Left eye exhibits no discharge. No scleral icterus.  Neck: Normal range of motion. Neck supple. No JVD present. No tracheal deviation present. No thyromegaly present.  Cardiovascular: Normal rate, regular rhythm, normal heart sounds and intact distal pulses.  Exam reveals no gallop and no friction rub.   No murmur heard. Respiratory: No stridor. No respiratory distress. He has no wheezes. He has no rales. He exhibits no tenderness.  GI: He exhibits no distension and no mass. There is no tenderness. There is no rebound and no guarding.  Musculoskeletal: He exhibits edema. He exhibits no tenderness.  2+ pitting edema of lower extremities bilaterally.  Lymphadenopathy:    He has no cervical adenopathy.  Neurological: He is alert and oriented to person, place, and time. He has normal reflexes. He displays normal reflexes. No cranial nerve deficit. He exhibits normal muscle tone. Coordination normal.  Skin: Skin is warm and dry. No erythema. No pallor.  Psychiatric:  Pt has very poor judgement and very poor insight into his own well-being.     Assessment/Plan 1. Acute renal failure DDX: a. Ureteral obstruction                                             B. Volume depletion  C. Sepsis Pt will receive IV fluids. Renal ultrasound is pending.  2. Metabolic acidosis secondary to acute renal failure. IV fluds with bicarbonate., resolution of renal failure. 3. UTI, possible pyelonephrtitis. IV antibiotics, urine culture. 4. Failure to thrive. Pt is likely not appropriate to go home to care for himself at the end of his hospitalization. 5. Sepsis with leukocytosis, tachycardia, and hypotension.  IV fluids and IV antibiotics.  Rosena Bartle 10/07/2013, 4:41 AM

## 2013-10-07 NOTE — Progress Notes (Signed)
Pt refused replacement of suprapubic cath at current time stating he wants to sleep and it can be done later. Educated patient it should be done as soon as possible. He stated he would rather sleep and not to bother him.  Will attempt replacement again with morning meds and vitals.

## 2013-10-07 NOTE — Progress Notes (Addendum)
Pt denies need for pain medicine. Pt still refusing suprapubic replacement until morning. Will continue to monitor..  (Previous text was placed in the wrong chart. Please ignore)

## 2013-10-07 NOTE — Progress Notes (Signed)
[facility] PROGRESS NOTE  [name] MKL:[id number] DOB: June 08, 1940 DOA: 10/06/2013 PCP: No PCP Per Patient  Renal US: R hydronephrosis  ? hydronephrosis related to AKI; consulted urology evaluation for obstruction   [name]  [facility] Pager [phone]. If 7PM-7AM, please contact night-coverage at [email or web address], password [facility] 10/07/2013, 12:51 PM  LOS: 1 day

## 2013-10-07 NOTE — Consult Note (Signed)
Urology Consult  Referring physician:  Dr. Daleen Bo Reason for referral:  Renal failure, hydronephrosis   Chief Complaint: "lost my appetite"  History of Present Illness: 73 yo AA male with hx of colon cancer, and severe urethral stricture disease in 2006, treated with suprapubic catheter-changed q month. Followed by Dr. Janice Norrie at [facility] when necessary. Longstanding anorexia ( from 2014 note).   Past Medical History  Diagnosis Date  . COPD (chronic obstructive pulmonary disease)    Past Surgical History  Procedure Laterality Date  . Abdominal surgery      Medications: I have reviewed the patient's current medications. Allergies: No Known Allergies  History reviewed. No pertinent family history. Social History:  reports that he has been smoking.  He does not have any smokeless tobacco history on file. He reports that he drinks alcohol. His drug history is not on file.  ROS: All systems are reviewed and negative except as noted. PMH of COPD,  GERD,  CKD,  BPH; and adenocarcinoma of the colon. s/p chronic suprapubic cath      Physical Exam:  Vital signs in last 24 hours: Temp:  [97.6 F (36.4 C)-97.9 F (36.6 C)] 97.9 F (36.6 C) (07/24 1304) Pulse Rate:  [85-120] 89 (07/24 1304) Resp:  [18-28] 18 (07/24 1304) BP: (91-132)/(54-89) 127/66 mmHg (07/24 1304) SpO2:  [97 %-100 %] 100 % (07/24 1304) Weight:  [56.2 kg (123 lb 14.4 oz)] 56.2 kg (123 lb 14.4 oz) (07/24 0035)  Cardiovascular: Skin warm; not flushed Respiratory: Breaths quiet; no shortness of breath Abdomen: No masses Neurological: Normal sensation to touch Musculoskeletal: Normal motor function arms and legs Lymphatics: No inguinal adenopathy Skin: No rashes Genitourinary: s-p tube changed this am.  Sp area soft. No erythema.   Laboratory Data:  Results for orders placed during the hospital encounter of 10/06/13 (from the past 72 hour(s))  CBC WITH DIFFERENTIAL     Status: Abnormal   Collection Time     10/06/13  9:43 PM      Result Value Ref Range   WBC 15.3 (*) 4.0 - 10.5 K/uL   RBC 4.96  4.22 - 5.81 MIL/uL   Hemoglobin 12.2 (*) 13.0 - 17.0 g/dL   HCT 37.6 (*) 39.0 - 52.0 %   MCV 75.8 (*) 78.0 - 100.0 fL   MCH 24.6 (*) 26.0 - 34.0 pg   MCHC 32.4  30.0 - 36.0 g/dL   RDW 14.8  11.5 - 15.5 %   Platelets 418 (*) 150 - 400 K/uL   Neutrophils Relative % 79 (*) 43 - 77 %   Lymphocytes Relative 12  12 - 46 %   Monocytes Relative 9  3 - 12 %   Eosinophils Relative 0  0 - 5 %   Basophils Relative 0  0 - 1 %   Neutro Abs 12.1 (*) 1.7 - 7.7 K/uL   Lymphs Abs 1.8  0.7 - 4.0 K/uL   Monocytes Absolute 1.4 (*) 0.1 - 1.0 K/uL   Eosinophils Absolute 0.0  0.0 - 0.7 K/uL   Basophils Absolute 0.0  0.0 - 0.1 K/uL   WBC Morphology MILD LEFT SHIFT (1-5% METAS, OCC MYELO, OCC BANDS)     Smear Review LARGE PLATELETS PRESENT    COMPREHENSIVE METABOLIC PANEL     Status: Abnormal   Collection Time    10/06/13  9:43 PM      Result Value Ref Range   Sodium 134 (*) 137 - 147 mEq/L   Potassium 4.9  3.7 - 5.3 mEq/L   Chloride 100  96 - 112 mEq/L   CO2 11 (*) 19 - 32 mEq/L   Glucose, Bld 105 (*) 70 - 99 mg/dL   BUN 96 (*) 6 - 23 mg/dL   Creatinine, Ser 5.45 (*) 0.50 - 1.35 mg/dL   Calcium 10.4  8.4 - 10.5 mg/dL   Total Protein 10.1 (*) 6.0 - 8.3 g/dL   Albumin 2.9 (*) 3.5 - 5.2 g/dL   AST 25  0 - 37 U/L   ALT 26  0 - 53 U/L   Alkaline Phosphatase 118 (*) 39 - 117 U/L   Total Bilirubin 0.2 (*) 0.3 - 1.2 mg/dL   GFR calc non Af Amer 9 (*) >90 mL/min   GFR calc Af Amer 11 (*) >90 mL/min   Comment: (NOTE)     The eGFR has been calculated using the CKD EPI equation.     This calculation has not been validated in all clinical situations.     eGFR's persistently <90 mL/min signify possible Chronic Kidney     Disease.   Anion gap 23 (*) 5 - 15   Comment: RESULT CHECKED  TROPONIN I     Status: None   Collection Time    10/06/13  9:43 PM      Result Value Ref Range   Troponin I <0.30  <0.30 ng/mL    Comment:            Due to the release kinetics of cTnI,     a negative result within the first hours     of the onset of symptoms does not rule out     myocardial infarction with certainty.     If myocardial infarction is still suspected,     repeat the test at appropriate intervals.  PRO B NATRIURETIC PEPTIDE     Status: Abnormal   Collection Time    10/06/13  9:43 PM      Result Value Ref Range   Pro B Natriuretic peptide (BNP) 1216.0 (*) 0 - 125 pg/mL  I-STAT CG4 LACTIC ACID, ED     Status: None   Collection Time    10/06/13  9:55 PM      Result Value Ref Range   Lactic Acid, Venous 1.37  0.5 - 2.2 mmol/L  URINALYSIS, ROUTINE W REFLEX MICROSCOPIC     Status: Abnormal   Collection Time    10/06/13 10:04 PM      Result Value Ref Range   Color, Urine YELLOW  YELLOW   APPearance TURBID (*) CLEAR   Specific Gravity, Urine 1.017  1.005 - 1.030   pH 8.0  5.0 - 8.0   Glucose, UA NEGATIVE  NEGATIVE mg/dL   Hgb urine dipstick SMALL (*) NEGATIVE   Bilirubin Urine NEGATIVE  NEGATIVE   Ketones, ur NEGATIVE  NEGATIVE mg/dL   Protein, ur >300 (*) NEGATIVE mg/dL   Urobilinogen, UA 0.2  0.0 - 1.0 mg/dL   Nitrite NEGATIVE  NEGATIVE   Leukocytes, UA LARGE (*) NEGATIVE  URINE MICROSCOPIC-ADD ON     Status: Abnormal   Collection Time    10/06/13 10:04 PM      Result Value Ref Range   WBC, UA FIELD OBSCURED BY RBC'S  <3 WBC/hpf   RBC / HPF FIELD OBSCURED BY WBC'S  <3 RBC/hpf   Bacteria, UA MANY (*) RARE   Urine-Other URINALYSIS PERFORMED ON SUPERNATANT    POC OCCULT BLOOD, ED  Status: None   Collection Time    10/06/13 10:37 PM      Result Value Ref Range   Fecal Occult Bld NEGATIVE  NEGATIVE  BLOOD GAS, ARTERIAL     Status: Abnormal   Collection Time    10/06/13 11:48 PM      Result Value Ref Range   FIO2 0.21     Delivery systems ROOM AIR     pH, Arterial 7.302 (*) 7.350 - 7.450   pCO2 arterial 17.8 (*) 35.0 - 45.0 mmHg   Comment: CRITICAL RESULT CALLED TO, READ BACK BY AND  VERIFIED WITH:      Vonna Kotyk, RN AT 1740 BY JESSICA NEUGENT,RRT,RCP ON 10/06/2013   pO2, Arterial 110.0 (*) 80.0 - 100.0 mmHg   Bicarbonate 8.5 (*) 20.0 - 24.0 mEq/L   TCO2 8.0  0 - 100 mmol/L   Acid-base deficit 16.3 (*) 0.0 - 2.0 mmol/L   O2 Saturation 97.2     Patient temperature 98.6     Collection site RIGHT BRACHIAL     Drawn by [id number]     Sample type ARTERIAL DRAW    BASIC METABOLIC PANEL     Status: Abnormal   Collection Time    10/07/13  1:15 AM      Result Value Ref Range   Sodium 134 (*) 137 - 147 mEq/L   Potassium 5.1  3.7 - 5.3 mEq/L   Chloride 104  96 - 112 mEq/L   CO2 10 (*) 19 - 32 mEq/L   Comment: CRITICAL RESULT CALLED TO, READ BACK BY AND VERIFIED WITH:     REINERT,L/4W $RemoveBefor'@0200'[id number]$  ON 10/07/13 BY KARCZEWSKI,S.   Glucose, Bld 97  70 - 99 mg/dL   BUN 96 (*) 6 - 23 mg/dL   Creatinine, Ser 5.05 (*) 0.50 - 1.35 mg/dL   Calcium 9.1  8.4 - 10.5 mg/dL   GFR calc non Af Amer 10 (*) >90 mL/min   GFR calc Af Amer 12 (*) >90 mL/min   Comment: (NOTE)     The eGFR has been calculated using the CKD EPI equation.     This calculation has not been validated in all clinical situations.     eGFR's persistently <90 mL/min signify possible Chronic Kidney     Disease.   Anion gap 20 (*) 5 - 15  CBC     Status: Abnormal   Collection Time    10/07/13  1:15 AM      Result Value Ref Range   WBC 13.0 (*) 4.0 - 10.5 K/uL   RBC 4.31  4.22 - 5.81 MIL/uL   Hemoglobin 10.3 (*) 13.0 - 17.0 g/dL   HCT 32.4 (*) 39.0 - 52.0 %   MCV 75.2 (*) 78.0 - 100.0 fL   MCH 23.9 (*) 26.0 - 34.0 pg   MCHC 31.8  30.0 - 36.0 g/dL   RDW 14.8  11.5 - 15.5 %   Platelets 384  150 - 400 K/uL   No results found for this or any previous visit (from the past 240 hour(s)). Creatinine:  Recent Labs  10/06/13 2143 10/07/13 0115  CREATININE 5.45* 5.05*    Xrays:   elevated BUN and creatinine. Prior history of colon cancer.  EXAM:  RENAL/URINARY TRACT ULTRASOUND COMPLETE  COMPARISON: Urinary tract  ultrasound 10/12/2006.  FINDINGS:  Right Kidney:  Length: Approximately 14.8 cm. Severe hydronephrosis. Marked  dilation of the proximal ureter. Severe diffuse cortical thinning.  Echogenic parenchyma without focal parenchymal abnormality. No  shadowing calculi.  Left Kidney:  Length: Approximately 10.4 cm. No hydronephrosis. Well-preserved  cortex. Approximate 1.8 x 1.6 x 2.2 cm hypoechoic mass arising from  the upper pole with slight acoustic enhancement, not visualized on  the prior examination, without internal color Doppler flow. No  shadowing calculi.  Bladder:  Decompressed by a suprapubic catheter.  IMPRESSION:  1. Severe right hydronephrosis associated with marked diffuse  cortical thinning, consistent with chronic obstruction.  2. Approximate 1.8 cm likely complex cyst with internal debris  arising from the upper pole of the left kidney. Since the patient is  unable to have IV contrast for CT or MRI due to renal insufficiency,  followup urinary tract ultrasound in 3 months is suggested.  Electronically Signed  By: Evangeline Dakin M.D.  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 Discussed wtit Dr. Janice Norrie, and note no further urologic Rx indicated. He also has a hx of  severe urethral stricture treated with  s-p tube. . Pt on antibiotics. No urologic Rx change for now. May need nephrology consult.   Plan:    Will follow as needed.  S-p tube changed this AM.   Julez Huseby I 10/07/2013, 5:12 PM

## 2013-10-07 NOTE — Progress Notes (Signed)
[facility] PROGRESS NOTE  Galdino Hinchman EPP:[id number] DOB: 29-May-1940 DOA: 10/06/2013 PCP: No PCP Per Patient  Assessment/Plan: 73 y/o male with PMH of COPD, GERD, CKD, BPH and adenocarcinoma of the colon s/p chronic suprapubic cath (8 years) presented with weakness, poor oral intake, found to have UTI, sepsis, AKI  1. Sepsis/UTI;  Started on IV atx; f/u cultures  2. 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 AG met acidosis due to renal failure, with sepsis  -cont IVF, monitor lytes  3. COPD, smoker; CXR: No acute cardiopulmonary process  -cont bronchodilators prn; stop smoking  4. Failure to thrive. Pt is likely not appropriate to go home to care for himself at the end of his hospitalization    Code Status: full Family Communication:  D/w patient (indicate person spoken with, relationship, and if by phone, the number) Disposition Plan: home pend clinical improvement    Consultants:  none  Procedures:  none  Antibiotics:  Ceftriaxone 7//23<<<<< (indicate start date, and stop date if known)  HPI/Subjective: alert  Objective: Filed Vitals:   10/07/13 0617  BP: 112/62  Pulse: 85  Temp: 97.8 F (36.6 C)  Resp: 18    Intake/Output Summary (Last 24 hours) at 10/07/13 0837 Last data filed at 10/07/13 0600  Gross per 24 hour  Intake 513.33 ml  Output    650 ml  Net -136.67 ml   Filed Weights   10/07/13 0035  Weight: 56.2 kg (123 lb 14.4 oz)    Exam:   General:  alert  Cardiovascular: s1,s2 rrr  Respiratory: CTA BL  Abdomen: soft, nt,nd   Musculoskeletal: no LE edema   Data Reviewed: Basic Metabolic Panel:  Recent Labs Lab 10/06/13 2143 10/07/13 0115  NA 134* 134*  K 4.9 5.1  CL 100 104  CO2 11* 10*  GLUCOSE 105* 97  BUN 96* 96*  CREATININE 5.45* 5.05*  CALCIUM 10.4 9.1   Liver Function Tests:  Recent Labs Lab  10/06/13 2143  AST 25  ALT 26  ALKPHOS 118*  BILITOT 0.2*  PROT 10.1*  ALBUMIN 2.9*   No results found for this basename: LIPASE, AMYLASE,  in the last 168 hours No results found for this basename: AMMONIA,  in the last 168 hours CBC:  Recent Labs Lab 10/06/13 2143 10/07/13 0115  WBC 15.3* 13.0*  NEUTROABS 12.1*  --   HGB 12.2* 10.3*  HCT 37.6* 32.4*  MCV 75.8* 75.2*  PLT 418* 384   Cardiac Enzymes:  Recent Labs Lab 10/06/13 2143  TROPONINI <0.30   BNP (last 3 results)  Recent Labs  10/06/13 2143  PROBNP 1216.0*   CBG: No results found for this basename: GLUCAP,  in the last 168 hours  No results found for this or any previous visit (from the past 240 hour(s)).   Studies: Dg Chest 2 View  10/06/2013   CLINICAL DATA:  Weakness and shortness of breath.  EXAM: CHEST  2 VIEW  COMPARISON:  Chest radiograph performed 10/10/2006  FINDINGS: The lungs are well-aerated and clear. There is no evidence of focal opacification, pleural effusion or pneumothorax.  The heart is normal in size; the mediastinal contour is within normal limits. Borderline right hilar prominence appears stable and reflects the patient's baseline. No acute osseous abnormalities are seen. A right-sided nipple shadow is noted.  IMPRESSION: No acute cardiopulmonary process seen.   Electronically Signed  By: Garald Balding M.D.   On: 10/06/2013 22:20    Scheduled Meds: . cefTRIAXone (ROCEPHIN)  IV  1 g Intravenous Q24H  . heparin  5,000 Units Subcutaneous 3 times per day  . hydrocortisone cream  1 application Topical BID  . pantoprazole  40 mg Oral Daily  . sodium chloride  3 mL Intravenous Q12H   Continuous Infusions: . sodium chloride 0.45 % 1,000 mL with sodium bicarbonate 50 mEq infusion 100 mL/hr at 10/07/13 0122    Active Problems:   GERD   Hydronephrosis   BENIGN PROSTATIC HYPERTROPHY, WITH OBSTRUCTION   Renal failure   Metabolic acidosis   UTI (lower urinary tract infection)   Failure  to thrive in adult    Time spent: >35 minutes     Kinnie Feil  [facility] Pager [phone]. If 7PM-7AM, please contact night-coverage at [email or web address], password [facility] 10/07/2013, 8:37 AM  LOS: 1 day

## 2013-10-07 NOTE — Progress Notes (Signed)
INITIAL NUTRITION ASSESSMENT  Pt meets criteria for severe MALNUTRITION in the context of as evidenced by severe muscle wasting and subcutaneous fat loss in orbital/temporal region, clavicles, upper arms, and hands.  DOCUMENTATION CODES Per approved criteria  -Severe malnutrition in the context of chronic illness -Underweight   INTERVENTION: - Ensure Complete TID - Encouraged increased meal intake - RD to continue to monitor   NUTRITION DIAGNOSIS: Increased nutrient needs related to pt underweight as evidenced by body mass index is 14.32 kg/(m^2).   Goal: Pt to consume >90% of meals/supplements  Monitor:  Weights, labs, intake   Reason for Assessment: Malnutrition screening tool   73 y.o. male  Admitting Dx: Weakness and poor PO intake   ASSESSMENT: Pt states that his relatives made him come to the ED. They complain that he is weak and has only been able to eat Ensure and applesauce for the past 3 days. The patient hasn't seen a PCP for several years. He does have a chronic indwelling foley catheter that was placed 8 years ago for obstruction. The patient has a history of BPH and adenocarcinoma of the colon. It is not known what the cause of the obstruction may have been. He is seen at his urologists office once a week, according to the patient, to change out his indwelling foley. Found to have urinary tract infection, sepsis, and acute kidney injury.   - Pt alone in room - States before the past 3 days he had a good appetite and was eating 2 meals/day - Only started drinking Ensure in the past 3 days - Unsure of any changes in weight - Not interested in nutrition focused physical exam due to wanting to sleep as he did not sleep well last night - Observed severe wasting in temporal/orbital region, clavicles, and arms/hands    Height: Ht Readings from Last 1 Encounters:  10/07/13 6\' 6"  (1.981 m)    Weight: Wt Readings from Last 1 Encounters:  10/07/13 123 lb 14.4 oz  (56.2 kg)    Ideal Body Weight: 214 lbs   % Ideal Body Weight: 57%  Wt Readings from Last 10 Encounters:  10/07/13 123 lb 14.4 oz (56.2 kg)  03/29/08 163 lb (73.936 kg)  02/17/07 152 lb (68.947 kg)    Usual Body Weight: 163 lbs 5 years ago   % Usual Body Weight: 75%  BMI:  Body mass index is 14.32 kg/(m^2). Underweight  Estimated Nutritional Needs: Kcal: 3532-9924 Protein: 130-150g Fluid: 2.3-2.5L/day   Skin: intact   Diet Order: General  EDUCATION NEEDS: -No education needs identified at this time   Intake/Output Summary (Last 24 hours) at 10/07/13 1510 Last data filed at 10/07/13 0600  Gross per 24 hour  Intake 513.33 ml  Output    650 ml  Net -136.67 ml    Last BM: 7/22  Labs:   Recent Labs Lab 10/06/13 2143 10/07/13 0115  NA 134* 134*  K 4.9 5.1  CL 100 104  CO2 11* 10*  BUN 96* 96*  CREATININE 5.45* 5.05*  CALCIUM 10.4 9.1  GLUCOSE 105* 97    CBG (last 3)  No results found for this basename: GLUCAP,  in the last 72 hours  Scheduled Meds: . cefTRIAXone (ROCEPHIN)  IV  1 g Intravenous Q24H  . heparin  5,000 Units Subcutaneous 3 times per day  . hydrocortisone cream  1 application Topical BID  . pantoprazole  40 mg Oral Daily  . sodium chloride  3 mL Intravenous Q12H  Continuous Infusions: . sodium chloride 0.45 % 1,000 mL with sodium bicarbonate 50 mEq infusion 100 mL/hr at 10/07/13 1224    Past Medical History  Diagnosis Date  . COPD (chronic obstructive pulmonary disease)     Past Surgical History  Procedure Laterality Date  . Abdominal surgery      Carlis Stable MS, RD, [place] Pager [phone] Weekend/After Hours Pager

## 2013-10-07 NOTE — Progress Notes (Signed)
Suprapubic catheter replaced per order. 16 Fr Foley catheter was placed and urine was returned immediately. 10 cc of NS were instilled into the balloon. Pt tolerated procedure well. Bag labeled. Will continue to monitor.

## 2013-10-07 NOTE — ED Notes (Signed)
Dr. Benny Lennert paged about blood gas results. Still wants pt. In same room assignment.

## 2013-10-08 DIAGNOSIS — E43 Unspecified severe protein-calorie malnutrition: Secondary | ICD-10-CM | POA: Insufficient documentation

## 2013-10-08 LAB — BASIC METABOLIC PANEL
ANION GAP: 18 — AB (ref 5–15)
BUN: 78 mg/dL — ABNORMAL HIGH (ref 6–23)
CHLORIDE: 103 meq/L (ref 96–112)
CO2: 16 mEq/L — ABNORMAL LOW (ref 19–32)
Calcium: 9 mg/dL (ref 8.4–10.5)
Creatinine, Ser: 4.16 mg/dL — ABNORMAL HIGH (ref 0.50–1.35)
GFR calc Af Amer: 15 mL/min — ABNORMAL LOW (ref >90)
GFR calc non Af Amer: 13 mL/min — ABNORMAL LOW (ref >90)
GLUCOSE: 84 mg/dL (ref 70–99)
Potassium: 4.3 mEq/L (ref 3.7–5.3)
SODIUM: 137 meq/L (ref 137–147)

## 2013-10-08 MED ORDER — LORAZEPAM 0.5 MG PO TABS
0.5000 mg | ORAL_TABLET | Freq: Once | ORAL | Status: AC
  Administered 2013-10-08: 0.5 mg via ORAL
  Filled 2013-10-08: qty 1

## 2013-10-08 NOTE — ED Provider Notes (Signed)
Medical screening examination/treatment/procedure(s) were conducted as a shared visit with non-physician practitioner(s) and myself.  I personally evaluated the patient during the encounter.   EKG Interpretation   Date/Time:  Thursday October 06 2013 21:34:16 EDT Ventricular Rate:  126 PR Interval:  154 QRS Duration: 78 QT Interval:  302 QTC Calculation: 437 R Axis:   34 Text Interpretation:  Sinus tachycardia Ventricular bigeminy Aberrant  conduction of SV complex(es) Consider right atrial enlargement Anterior  infarct, old Nonspecific repol abnormality, lateral leads ED PHYSICIAN  INTERPRETATION AVAILABLE IN [facility] Confirmed by TEST, Record  ([id number]) on 10/08/2013 7:13:59 AM      Patient presents with generalized weakness and decreased PO as well as SOB.  Ill appearing but nontoxic.  Thin on exam. Chronic indwelling foley.  No respiratory distress.  Noted to be tachycardic.  Patient given IVF.  Work-up notable for AKI and anion gap of 23 and leukocytosis.  Patient's urine also likey dirty.  GIven abx and fluids.  Will admit for further management.  Merryl Hacker, MD 10/08/13 [phone]

## 2013-10-08 NOTE — Progress Notes (Signed)
Received report from grace and agree with assessment.

## 2013-10-08 NOTE — Consult Note (Addendum)
Renal Service Consult Note [facility]  [place] 10/08/2013 [place] D Requesting Physician:  Dr Daleen Bo      Reason for Consult:  Acute on CKD HPI: The patient is a 73 y.o. year-old with hx of COPD, CKD, urethral stricture, hx colon Ca treated surgically, UTI's presenting with gen weakness, loss of appetitie. Has chronic indwelling foley cath placed 8 yrs for obstruction. Hx BPH and colon cancer w partial colectomy.    Patient presented with poor appetite, very weak, anorexia, fatigued.  Creat was found to be up at 5.05 and pt was admitted. Home meds were PPI , HC cream and FeSO4. No nsaids. Creat down to 4.16 today and making urine with IVF's. [place] results see below  Home meds are: fe sulfate, HC cream 1      Date  Creat  eGFR  CKD 2008  6.93 > 1.97 8-34  Acute on CKD IIIb 10/06/13 5.45  9 10/07/13 5.05  10 10/08/13 4.16  13   Ultrasound reports: 12/06 -- [place] 12 cm bilat, severe bilat hydronephrosis and ureterectasis 05/07 -- 12 cm bilat marked hydroureteronephrosis, foley in place 10/08/06 -- 14-15 cm kidneys, ^'d echogenicity, marked R-sided hydroureteronephrosis w cortical thinning; moderate to marked L-sided hydroureteronephrosis w overlying cortical thinning 10/12/06 -- moderate bilateral hydronephrosis >> +bilat hydronephrosis w dilatation of the pelvocaliceal systems, ureters dilated into distal abdomen, renal cortical thickness is normal bilat with normal echogenicity 10/07/13 -- R kidney 14.8 cm, severh hydronephrosis, marked dilation of prox ureter, severe diffuse cortical thinning, echogenic parenchyma w/o stones; L kidney 10.4 cm, no hydronephrosis, well-preserved cortex, cyst x 1, SP cath in bladder   Chart review: 12/06-- colonoscopy 12/06-- R hemicolectomy w SN biopsy using Lymphazurin blue for R colon carcinoma 12/06-- admit for 17days, dehydration, AKI, AECOPD, cachexia, anemia.  Ureteral obstruction due to urethral stricture, R colon cancer. Surgery  above removed tumor with neg nodes and clean margins, no chemo per ONC 5/07  -- urethral stricture > has cystoscopic urethral dilatation. EGD showed severe esophagitis c/w reflux vs candidiasis. EColi UTI. Urethral stricture, recurrent. CT abd did not confirm any fistula. Foley was placed and AKI resolved also w IVF"s 9/08 --  Low BP, diarrhea, fevers, temop 105, dehydrated >> E Coli UTI with borderline sepsis, treated with FOley cath and IV abx 11/08 -- acute pyelonephritis, hx colon ca, hx BPH w hx hydroureter and hydronephrosis. CKD creat 1.9-2.0. Treated w abx 05/09 -- urology does cystoscopy and [place] insertion of SP tube via cystosomy     ROS  no fevers, chills, rigors,   no ha, rash  no n/v/d  no abd pain  no cp, sob  Past Medical History  Past Medical History  Diagnosis Date  . COPD (chronic obstructive pulmonary disease)    Past Surgical History  Past Surgical History  Procedure Laterality Date  . Abdominal surgery     Family History History reviewed. No pertinent family history. Social History  reports that he has been smoking.  He does not have any smokeless tobacco history on file. He reports that he drinks alcohol. His drug history is not on file. Allergies No Known Allergies Home medications Prior to Admission medications   Medication Sig Start Date End Date Taking? Authorizing Provider  ferrous sulfate 325 (65 FE) MG tablet Take 325 mg by mouth daily with breakfast.   Yes Historical Provider, MD  hydrocortisone cream 1 % Apply 1 application topically 2 (two) times daily.   Yes Historical Provider, MD  omeprazole (PRILOSEC) 20 MG capsule Take 20 mg by mouth daily.   Yes Historical Provider, MD   Liver Function Tests  Recent Labs Lab 10/06/13 2143  AST 25  ALT 26  ALKPHOS 118*  BILITOT 0.2*  PROT 10.1*  ALBUMIN 2.9*   No results found for this basename: LIPASE, AMYLASE,  in the last 168 hours CBC  Recent Labs Lab 10/06/13 2143 10/07/13 0115  WBC  15.3* 13.0*  NEUTROABS 12.1*  --   HGB 12.2* 10.3*  HCT 37.6* 32.4*  MCV 75.8* 75.2*  PLT 418* 580   Basic Metabolic Panel  Recent Labs Lab 10/06/13 2143 10/07/13 0115 10/08/13 0520  NA 134* 134* 137  K 4.9 5.1 4.3  CL 100 104 103  CO2 11* 10* 16*  GLUCOSE 105* 97 84  BUN 96* 96* 78*  CREATININE 5.45* 5.05* 4.16*  CALCIUM 10.4 9.1 9.0    Filed Vitals:   10/07/13 1304 10/07/13 2153 10/08/13 0508 10/08/13 1413  BP: 127/66 121/71 133/78 123/78  Pulse: 89 91 111 88  Temp: 97.9 F (36.6 C) 98.2 F (36.8 C) 98.1 F (36.7 C) 98.1 F (36.7 C)  TempSrc: Oral Oral Oral Oral  Resp: _0 Height:      Weight:      SpO2: 100% 97% 93% 94%   Exam Alert, thin AAM no distress No rash, cyanosis or gangrene Sclera anicteric, throat clear No jvd Chest clear bilat RRR no MRG Abd soft, NTND , no mass or ascites No LE edema SP cath in place draining cloudy urine  Urine cx +E Coli 40k UA - turbid, >300 prot, tntc wbc and rbc, many bact   Assessment: 1 Acute renal failure- combination of chronic renal damage to R kidney (permanent from obstructive nephropathy) and acute KI from dehydration, UTI , sepsis; expect he will improve, not sure where baseline will be.  Cont IVF's, avoid contrast / ACEI , NSAID"S, ARB.  Defer obstruction issues to urology who are following. 2 Hx urethral stricture / indwelling SP cath 3 COPD 4 Met acidosis from A/CRF 5 UTI- growing E Coli in urine   Plan- Increase IVF's, watch exam, UOP , weights, will follow.   Kelly Splinter MD (pgr) [phone]    ([id number] 10/08/2013, 4:30 PM

## 2013-10-08 NOTE — Progress Notes (Signed)
[facility] PROGRESS NOTE  [name] HYW:[id number] DOB: [birth year] DOA: 10/06/2013 PCP: No PCP Per Patient  Assessment/Plan: 73 y/o male with PMH of COPD, GERD, CKD, BPH and adenocarcinoma of the colon s/p chronic suprapubic cath (8 years) presented with weakness, poor oral intake, found to have UTI, sepsis, AKI  1. Sepsis/UTI;  Cont IV atx; blood cultures : NGTD; urine culture pend  2. 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 AG met acidosis due to renal failure, with sepsis  -cont IVF, monitor lytes  3. COPD, smoker; CXR: No acute cardiopulmonary process  -cont bronchodilators prn; stop smoking  4. Failure to thrive. Pt is likely not appropriate to go home to care for himself at the end of his hospitalization    Code Status: full Family Communication:  D/w patient (indicate person spoken with, relationship, and if by phone, the number) Disposition Plan: home pend clinical improvement    Consultants:  Urology, nephrology   Procedures:  none  Antibiotics:  Ceftriaxone 7//23<<<<< (indicate start date, and stop date if known)  HPI/Subjective: alert  Objective: Filed Vitals:   10/08/13 0508  BP: 133/78  Pulse: 111  Temp: 98.1 F (36.7 C)  Resp: 18    Intake/Output Summary (Last 24 hours) at 10/08/13 1257 Last data filed at 10/08/13 0509  Gross per 24 hour  Intake   2530 ml  Output   1100 ml  Net   1430 ml   Filed Weights   10/07/13 0035  Weight: 56.2 kg (123 lb 14.4 oz)    Exam:   General:  alert  Cardiovascular: s1,s2 rrr  Respiratory: CTA BL  Abdomen: soft, nt,nd   Musculoskeletal: no LE edema   Data Reviewed: Basic Metabolic Panel:  Recent Labs Lab 10/06/13 2143 10/07/13 0115 10/08/13 0520  NA 134* 134* 137  K 4.9 5.1 4.3  CL 100  104 103  CO2 11* 10* 16*  GLUCOSE 105* 97 84  BUN 96* 96* 78*  CREATININE 5.45* 5.05* 4.16*  CALCIUM 10.4 9.1 9.0   Liver Function Tests:  Recent Labs Lab 10/06/13 2143  AST 25  ALT 26  ALKPHOS 118*  BILITOT 0.2*  PROT 10.1*  ALBUMIN 2.9*   No results found for this basename: LIPASE, AMYLASE,  in the last 168 hours No results found for this basename: AMMONIA,  in the last 168 hours CBC:  Recent Labs Lab 10/06/13 2143 10/07/13 0115  WBC 15.3* 13.0*  NEUTROABS 12.1*  --   HGB 12.2* 10.3*  HCT 37.6* 32.4*  MCV 75.8* 75.2*  PLT 418* 384   Cardiac Enzymes:  Recent Labs Lab 10/06/13 2143  TROPONINI <0.30   BNP (last 3 results)  Recent Labs  10/06/13 2143  PROBNP 1216.0*   CBG: No results found for this basename: GLUCAP,  in the last 168 hours  Recent Results (from the past 240 hour(s))  CULTURE, BLOOD (ROUTINE X 2)     Status: None   Collection Time    10/07/13  1:15 AM      Result Value Ref Range Status   Specimen Description BLOOD RIGHT ANTECUBITAL   Final   Special Requests BOTTLES DRAWN AEROBIC ONLY 10CC   Final   Culture  Setup Time     Final   Value: 10/07/2013 08:50     Performed at  [facility]     Final   Value:        BLOOD CULTURE RECEIVED NO GROWTH TO DATE CULTURE WILL BE HELD FOR 5 DAYS BEFORE ISSUING A FINAL NEGATIVE REPORT     Performed at [facility]   Report Status PENDING   Incomplete  CULTURE, BLOOD (ROUTINE X 2)     Status: None   Collection Time    10/07/13  1:20 AM      Result Value Ref Range Status   Specimen Description BLOOD RIGHT FOREARM   Final   Special Requests BOTTLES DRAWN AEROBIC ONLY 10CC   Final   Culture  Setup Time     Final   Value: 10/07/2013 08:50     Performed at [facility]   Culture     Final   Value:        BLOOD CULTURE RECEIVED NO GROWTH TO DATE CULTURE WILL BE HELD FOR 5 DAYS BEFORE ISSUING A FINAL NEGATIVE REPORT     Performed at [facility]   Report  Status PENDING   Incomplete     Studies: Dg Chest 2 View  10/06/2013   CLINICAL DATA:  Weakness and shortness of breath.  EXAM: CHEST  2 VIEW  COMPARISON:  Chest radiograph performed 10/10/2006  FINDINGS: The lungs are well-aerated and clear. There is no evidence of focal opacification, pleural effusion or pneumothorax.  The heart is normal in size; the mediastinal contour is within normal limits. Borderline right hilar prominence appears stable and reflects the patient's baseline. No acute osseous abnormalities are seen. A right-sided nipple shadow is noted.  IMPRESSION: No acute cardiopulmonary process seen.   Electronically Signed   By: Garald Balding M.D.   On: 10/06/2013 22:20   US Renal  10/07/2013   CLINICAL DATA:  Metabolic acidosis. Renal insufficiency with elevated BUN and creatinine. Prior history of colon cancer.  EXAM: RENAL/URINARY TRACT ULTRASOUND COMPLETE  COMPARISON:  Urinary tract ultrasound 10/12/2006.  FINDINGS: Right Kidney:  Length: Approximately 14.8 cm. Severe hydronephrosis. Marked dilation of the proximal ureter. Severe diffuse cortical thinning. Echogenic parenchyma without focal parenchymal abnormality. No shadowing calculi.  Left Kidney:  Length: Approximately 10.4 cm. No hydronephrosis. Well-preserved cortex. Approximate 1.8 x 1.6 x 2.2 cm hypoechoic mass arising from the upper pole with slight acoustic enhancement, not visualized on the prior examination, without internal color Doppler flow. No shadowing calculi.  Bladder:  Decompressed by a suprapubic catheter.  IMPRESSION: 1. Severe right hydronephrosis associated with marked diffuse cortical thinning, consistent with chronic obstruction. 2. Approximate 1.8 cm likely complex cyst with internal debris arising from the upper pole of the left kidney. Since the patient is unable to have IV contrast for CT or MRI due to renal insufficiency, followup urinary tract ultrasound in 3 months is suggested.   Electronically Signed   By:  Evangeline Dakin M.D.   On: 10/07/2013 11:49    Scheduled Meds: . cefTRIAXone (ROCEPHIN)  IV  1 g Intravenous Q24H  . feeding supplement (ENSURE COMPLETE)  237 mL Oral TID BM  . heparin  5,000 Units Subcutaneous 3 times per day  . hydrocortisone cream  1 application Topical BID  . pantoprazole  40 mg Oral Daily  . sodium chloride  3 mL Intravenous Q12H   Continuous Infusions: . sodium chloride 0.45 % 1,000 mL with sodium bicarbonate 50 mEq infusion 100 mL/hr at 10/08/13 1505    Active Problems:   GERD  Hydronephrosis   BENIGN PROSTATIC HYPERTROPHY, WITH OBSTRUCTION   Renal failure   Metabolic acidosis   UTI (lower urinary tract infection)   Failure to thrive in adult   Protein-calorie malnutrition, severe    Time spent: >35 minutes     Kinnie Feil  [facility] Pager [phone]. If 7PM-7AM, please contact night-coverage at [email or web address], password [facility] 10/08/2013, 12:57 PM  LOS: 2 days

## 2013-10-09 LAB — BASIC METABOLIC PANEL
ANION GAP: 14 (ref 5–15)
BUN: 57 mg/dL — ABNORMAL HIGH (ref 6–23)
CHLORIDE: 103 meq/L (ref 96–112)
CO2: 21 meq/L (ref 19–32)
Calcium: 8.6 mg/dL (ref 8.4–10.5)
Creatinine, Ser: 3.19 mg/dL — ABNORMAL HIGH (ref 0.50–1.35)
GFR calc Af Amer: 21 mL/min — ABNORMAL LOW (ref >90)
GFR calc non Af Amer: 18 mL/min — ABNORMAL LOW (ref >90)
GLUCOSE: 106 mg/dL — AB (ref 70–99)
POTASSIUM: 3.8 meq/L (ref 3.7–5.3)
SODIUM: 138 meq/L (ref 137–147)

## 2013-10-09 LAB — URINE CULTURE
Colony Count: 40000
SPECIAL REQUESTS: NORMAL

## 2013-10-09 MED ORDER — ONDANSETRON HCL 4 MG/2ML IJ SOLN
4.0000 mg | Freq: Four times a day (QID) | INTRAMUSCULAR | Status: DC | PRN
  Administered 2013-10-09: 4 mg via INTRAVENOUS
  Filled 2013-10-09: qty 2

## 2013-10-09 MED ORDER — TRAZODONE HCL 50 MG PO TABS
50.0000 mg | ORAL_TABLET | Freq: Every evening | ORAL | Status: DC | PRN
  Administered 2013-10-09: 50 mg via ORAL
  Filled 2013-10-09: qty 1

## 2013-10-09 NOTE — Progress Notes (Signed)
[facility] PROGRESS NOTE  [name] MWN:[id number] DOB: Jul 25, 1940 DOA: 10/06/2013 PCP: No PCP Per Patient  Assessment/Plan: 73 y/o male with PMH of COPD, GERD, CKD, BPH and adenocarcinoma of the colon s/p chronic suprapubic cath (8 years) presented with weakness, poor oral intake, found to have UTI, sepsis, AKI  1. Sepsis/UTI;  Cont IV atx; blood cultures : NGTD; urine culture E coli   2. 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 AG met acidosis due to renal failure, with sepsis  -improved on IVF, monitor lytes  3. COPD, smoker; CXR: No acute cardiopulmonary process  -cont bronchodilators prn; stop smoking  4. Failure to thrive. Pt is likely not appropriate to go home to care for himself at the end of his hospitalization    Code Status: full Family Communication:  D/w patient (indicate person spoken with, relationship, and if by phone, the number) Disposition Plan: home pend clinical improvement 24-48 hrs    Consultants:  Urology, nephrology   Procedures:  none  Antibiotics:  Ceftriaxone 7//23<<<<< (indicate start date, and stop date if known)  HPI/Subjective: alert  Objective: Filed Vitals:   10/09/13 0612  BP: 121/70  Pulse: 83  Temp: 98.6 F (37 C)  Resp: 18    Intake/Output Summary (Last 24 hours) at 10/09/13 0939 Last data filed at 10/09/13 4034  Gross per 24 hour  Intake   3660 ml  Output   1925 ml  Net   1735 ml   Filed Weights   10/07/13 0035 10/09/13 0612  Weight: 56.2 kg (123 lb 14.4 oz) 57.5 kg (126 lb 12.2 oz)    Exam:   General:  alert  Cardiovascular: s1,s2 rrr  Respiratory: CTA BL  Abdomen: soft, nt,nd   Musculoskeletal: no LE edema   Data Reviewed: Basic Metabolic Panel:  Recent Labs Lab 10/06/13 2143  10/07/13 0115 10/08/13 0520 10/09/13 0500  NA 134* 134* 137 138  K 4.9 5.1 4.3 3.8  CL 100 104 103 103  CO2 11* 10* 16* 21  GLUCOSE 105* 97 84 106*  BUN 96* 96* 78* 57*  CREATININE 5.45* 5.05* 4.16* 3.19*  CALCIUM 10.4 9.1 9.0 8.6   Liver Function Tests:  Recent Labs Lab 10/06/13 2143  AST 25  ALT 26  ALKPHOS 118*  BILITOT 0.2*  PROT 10.1*  ALBUMIN 2.9*   No results found for this basename: LIPASE, AMYLASE,  in the last 168 hours No results found for this basename: AMMONIA,  in the last 168 hours CBC:  Recent Labs Lab 10/06/13 2143 10/07/13 0115  WBC 15.3* 13.0*  NEUTROABS 12.1*  --   HGB 12.2* 10.3*  HCT 37.6* 32.4*  MCV 75.8* 75.2*  PLT 418* 384   Cardiac Enzymes:  Recent Labs Lab 10/06/13 2143  TROPONINI <0.30   BNP (last 3 results)  Recent Labs  10/06/13 2143  PROBNP 1216.0*   CBG: No results found for this basename: GLUCAP,  in the last 168 hours  Recent Results (from the past 240 hour(s))  CULTURE, BLOOD (ROUTINE X 2)     Status: None   Collection Time    10/07/13  1:15 AM      Result Value Ref Range Status   Specimen Description BLOOD RIGHT ANTECUBITAL   Final   Special Requests BOTTLES DRAWN AEROBIC ONLY 10CC  Final   Culture  Setup Time     Final   Value: 10/07/2013 08:50     Performed at [facility]   Culture     Final   Value:        BLOOD CULTURE RECEIVED NO GROWTH TO DATE CULTURE WILL BE HELD FOR 5 DAYS BEFORE ISSUING A FINAL NEGATIVE REPORT     Performed at [facility]   Report Status PENDING   Incomplete  CULTURE, BLOOD (ROUTINE X 2)     Status: None   Collection Time    10/07/13  1:20 AM      Result Value Ref Range Status   Specimen Description BLOOD RIGHT FOREARM   Final   Special Requests BOTTLES DRAWN AEROBIC ONLY 10CC   Final   Culture  Setup Time     Final   Value: 10/07/2013 08:50     Performed at [facility]   Culture     Final   Value:        BLOOD CULTURE RECEIVED NO GROWTH TO DATE  CULTURE WILL BE HELD FOR 5 DAYS BEFORE ISSUING A FINAL NEGATIVE REPORT     Performed at [facility]   Report Status PENDING   Incomplete  URINE CULTURE     Status: None   Collection Time    10/07/13  6:03 AM      Result Value Ref Range Status   Specimen Description URINE, CATHETERIZED   Final   Special Requests Normal   Final   Culture  Setup Time     Final   Value: 10/07/2013 08:53     Performed at [facility]     Final   Value: 40,000 COLONIES/ML     Performed at [facility]   Culture     Final   Value: ESCHERICHIA COLI     Performed at [facility]   Report Status PENDING   Incomplete     Studies: US Renal  10/07/2013   CLINICAL DATA:  Metabolic acidosis. Renal insufficiency with elevated BUN and creatinine. Prior history of colon cancer.  EXAM: RENAL/URINARY TRACT ULTRASOUND COMPLETE  COMPARISON:  Urinary tract ultrasound 10/12/2006.  FINDINGS: Right Kidney:  Length: Approximately 14.8 cm. Severe hydronephrosis. Marked dilation of the proximal ureter. Severe diffuse cortical thinning. Echogenic parenchyma without focal parenchymal abnormality. No shadowing calculi.  Left Kidney:  Length: Approximately 10.4 cm. No hydronephrosis. Well-preserved cortex. Approximate 1.8 x 1.6 x 2.2 cm hypoechoic mass arising from the upper pole with slight acoustic enhancement, not visualized on the prior examination, without internal color Doppler flow. No shadowing calculi.  Bladder:  Decompressed by a suprapubic catheter.  IMPRESSION: 1. Severe right hydronephrosis associated with marked diffuse cortical thinning, consistent with chronic obstruction. 2. Approximate 1.8 cm likely complex cyst with internal debris arising from the upper pole of the left kidney. Since the patient is unable to have IV contrast for CT or MRI due to renal insufficiency, followup urinary tract ultrasound in 3 months is suggested.   Electronically Signed   By: Evangeline Dakin M.D.    On: 10/07/2013 11:49    Scheduled Meds: . cefTRIAXone (ROCEPHIN)  IV  1 g Intravenous Q24H  . feeding supplement (ENSURE COMPLETE)  237 mL Oral TID BM  . heparin  5,000 Units Subcutaneous 3 times per day  . hydrocortisone cream  1 application Topical BID  . pantoprazole  40 mg Oral Daily  .  sodium chloride  3 mL Intravenous Q12H   Continuous Infusions: . sodium chloride 0.45 % 1,000 mL with sodium bicarbonate 50 mEq infusion 150 mL/hr at 10/09/13 8110    Active Problems:   GERD   Hydronephrosis   BENIGN PROSTATIC HYPERTROPHY, WITH OBSTRUCTION   Renal failure   Metabolic acidosis   UTI (lower urinary tract infection)   Failure to thrive in adult   Protein-calorie malnutrition, severe    Time spent: >35 minutes     Kinnie Feil  [facility] Pager [phone]. If 7PM-7AM, please contact night-coverage at [email or web address], password [facility] 10/09/2013, 9:39 AM  LOS: 3 days

## 2013-10-09 NOTE — Progress Notes (Signed)
  [facility] Progress Note   Subjective: creat down to 3.1 today, UOP about 1.8L. No complaints or pain  Filed Vitals:   10/08/13 0508 10/08/13 1413 10/08/13 2159 10/09/13 0612  BP: 133/78 123/78 126/75 121/70  Pulse: 111 88 86 83  Temp: 98.1 F (36.7 C) 98.1 F (36.7 C) 98.3 F (36.8 C) 98.6 F (37 C)  TempSrc: Oral Oral Oral Oral  Resp: _0 Height:      Weight:    57.5 kg (126 lb 12.2 oz)  SpO2: 93% 94% 100% 100%   Exam: Alert, thin AAM no distress No jvd  Chest clear bilat  RRR no MRG  Abd soft, NTND , no mass or ascites  No LE or UE edema  SP cath in place draining cloudy urine Neuro is Ox3, nf  ECHO 2008  LV EF reduced at 40-45%  Urine cx +E Coli 40k  UA - turbid, >300 prot, tntc wbc and rbc, many bact 10/07/13 -- R kidney 14.8 cm, severe hydronephrosis, marked dilation of prox ureter, severe diffuse cortical thinning and echogenic parenchyma w/o stones; L kidney 10.4 cm, no hydronephrosis, well-preserved cortex, cyst x 1, SP cath in bladder   Assessment:  1 Acute on chronic renal failure / severe and chronic R renal hydronephrosis- R kidney with thinned cortex and nonfunctional.  Has AKI from dehydrating illness and creat is improving with fluids. Unsure baseline creat, was around 1.9 in 2008, down to 3.1 today. He wants to have renal f/u after discharge and we will set this up. Will follow.  2 Hx urethral stricture / indwelling SP cath  3 COPD  4 Met acidosis - resolving 5 UTI- growing E Coli in urine  Plan- Lower IVF's, check creat in am    Kelly Splinter MD  pager [phone]    cell [phone]  10/09/2013, 7:11 AM     Recent Labs Lab 10/07/13 0115 10/08/13 0520 10/09/13 0500  NA 134* 137 138  K 5.1 4.3 3.8  CL 104 103 103  CO2 10* 16* 21  GLUCOSE 97 84 106*  BUN 96* 78* 57*  CREATININE 5.05* 4.16* 3.19*  CALCIUM 9.1 9.0 8.6    Recent Labs Lab 10/06/13 2143  AST 25  ALT 26  ALKPHOS 118*  BILITOT 0.2*  PROT 10.1*   ALBUMIN 2.9*    Recent Labs Lab 10/06/13 2143 10/07/13 0115  WBC 15.3* 13.0*  NEUTROABS 12.1*  --   HGB 12.2* 10.3*  HCT 37.6* 32.4*  MCV 75.8* 75.2*  PLT 418* 384   . cefTRIAXone (ROCEPHIN)  IV  1 g Intravenous Q24H  . feeding supplement (ENSURE COMPLETE)  237 mL Oral TID BM  . heparin  5,000 Units Subcutaneous 3 times per day  . hydrocortisone cream  1 application Topical BID  . pantoprazole  40 mg Oral Daily  . sodium chloride  3 mL Intravenous Q12H   . sodium chloride 0.45 % 1,000 mL with sodium bicarbonate 50 mEq infusion 150 mL/hr at 10/09/13 4492   acetaminophen, acetaminophen, HYDROcodone-acetaminophen

## 2013-10-10 LAB — UIFE/LIGHT CHAINS/TP QN, 24-HR UR
ALPHA 2 UR: DETECTED — AB
Albumin, U: DETECTED
Alpha 1, Urine: DETECTED — AB
BETA UR: DETECTED — AB
Free Kappa Lt Chains,Ur: 36.4 mg/dL — ABNORMAL HIGH (ref 0.14–2.42)
Free Kappa/Lambda Ratio: 3.86 ratio (ref 2.04–10.37)
Free Lambda Lt Chains,Ur: 9.44 mg/dL — ABNORMAL HIGH (ref 0.02–0.67)
GAMMA UR: DETECTED — AB
Total Protein, Urine: 85 mg/dL

## 2013-10-10 LAB — RENAL FUNCTION PANEL
ANION GAP: 18 — AB (ref 5–15)
Albumin: 2 g/dL — ABNORMAL LOW (ref 3.5–5.2)
BUN: 37 mg/dL — ABNORMAL HIGH (ref 6–23)
CHLORIDE: 97 meq/L (ref 96–112)
CO2: 21 meq/L (ref 19–32)
Calcium: 8.5 mg/dL (ref 8.4–10.5)
Creatinine, Ser: 2.54 mg/dL — ABNORMAL HIGH (ref 0.50–1.35)
GFR, EST AFRICAN AMERICAN: 27 mL/min — AB (ref >90)
GFR, EST NON AFRICAN AMERICAN: 23 mL/min — AB (ref >90)
GLUCOSE: 64 mg/dL — AB (ref 70–99)
POTASSIUM: 3.5 meq/L — AB (ref 3.7–5.3)
Phosphorus: 3.1 mg/dL (ref 2.3–4.6)
Sodium: 136 mEq/L — ABNORMAL LOW (ref 137–147)

## 2013-10-10 MED ORDER — MIRTAZAPINE 7.5 MG PO TABS: 7.5000 mg | ORAL_TABLET | Freq: Every day | ORAL | Status: DC

## 2013-10-10 MED ORDER — CEPHALEXIN 500 MG PO CAPS: 500.0000 mg | ORAL_CAPSULE | Freq: Three times a day (TID) | ORAL | Status: DC

## 2013-10-10 MED ORDER — MIRTAZAPINE 7.5 MG PO TABS
7.5000 mg | ORAL_TABLET | Freq: Every day | ORAL | Status: DC
  Filled 2013-10-10: qty 1

## 2013-10-10 MED ORDER — FERROUS SULFATE 325 (65 FE) MG PO TABS: 325.0000 mg | ORAL_TABLET | Freq: Every day | ORAL | Status: AC

## 2013-10-10 NOTE — Progress Notes (Signed)
Pt D/C'd home via wheelchair. Pt wheeled down by this RN with all belongings including equipment from [place]. Pt ambulated to car with little assistance.  Carnella [place] I

## 2013-10-10 NOTE — Progress Notes (Signed)
[facility] PROGRESS NOTE  [name] CAT:[id number] DOB: 1940/04/12 DOA: 10/06/2013 PCP: No PCP Per Patient  Assessment/Plan: 73 y/o male with PMH of COPD, GERD, CKD, BPH and adenocarcinoma of the colon s/p chronic suprapubic cath (8 years) presented with weakness, poor oral intake, found to have UTI, sepsis, AKI  1. Sepsis/UTI;  Cont IV atx; blood cultures : NGTD; urine culture E coli ; will change to keflex upon d/c  2. 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 AG met acidosis due to renal failure, with sepsis  -improved on IVF, monitor lytes  3. COPD, smoker; CXR: No acute cardiopulmonary process  -cont bronchodilators prn; stop smoking  4. Failure to thrive.pend PT -mirtazapine to try; encourage PO intake   Pend PT eval for d/c plans    Code Status: full Family Communication:  D/w patient (indicate person spoken with, relationship, and if by phone, the number) Disposition Plan: home pend clinical improvement 24-48 hrs    Consultants:  Urology, nephrology   Procedures:  none  Antibiotics:  Ceftriaxone 7//23<<<<< (indicate start date, and stop date if known)  HPI/Subjective: alert  Objective: Filed Vitals:   10/10/13 0508  BP: 138/81  Pulse: 88  Temp: 98 F (36.7 C)  Resp: 18    Intake/Output Summary (Last 24 hours) at 10/10/13 1323 Last data filed at 10/10/13 0900  Gross per 24 hour  Intake   2845 ml  Output   2100 ml  Net    745 ml   Filed Weights   10/07/13 0035 10/09/13 0612 10/10/13 0508  Weight: 56.2 kg (123 lb 14.4 oz) 57.5 kg (126 lb 12.2 oz) 56.9 kg (125 lb 7.1 oz)    Exam:   General:  alert  Cardiovascular: s1,s2 rrr  Respiratory: CTA BL  Abdomen: soft, nt,nd   Musculoskeletal: no LE edema   Data  Reviewed: Basic Metabolic Panel:  Recent Labs Lab 10/06/13 2143 10/07/13 0115 10/08/13 0520 10/09/13 0500 10/10/13 0857  NA 134* 134* 137 138 136*  K 4.9 5.1 4.3 3.8 3.5*  CL 100 104 103 103 97  CO2 11* 10* 16* 21 21  GLUCOSE 105* 97 84 106* 64*  BUN 96* 96* 78* 57* 37*  CREATININE 5.45* 5.05* 4.16* 3.19* 2.54*  CALCIUM 10.4 9.1 9.0 8.6 8.5  PHOS  --   --   --   --  3.1   Liver Function Tests:  Recent Labs Lab 10/06/13 2143 10/10/13 0857  AST 25  --   ALT 26  --   ALKPHOS 118*  --   BILITOT 0.2*  --   PROT 10.1*  --   ALBUMIN 2.9* 2.0*   No results found for this basename: LIPASE, AMYLASE,  in the last 168 hours No results found for this basename: AMMONIA,  in the last 168 hours CBC:  Recent Labs Lab 10/06/13 2143 10/07/13 0115  WBC 15.3* 13.0*  NEUTROABS 12.1*  --   HGB 12.2* 10.3*  HCT 37.6* 32.4*  MCV 75.8* 75.2*  PLT 418* 384   Cardiac Enzymes:  Recent Labs Lab 10/06/13 2143  TROPONINI <0.30   BNP (last 3 results)  Recent Labs  10/06/13 2143  PROBNP 1216.0*   CBG: No results found for this basename: GLUCAP,  in the last 168 hours  Recent Results (from the  past 240 hour(s))  CULTURE, BLOOD (ROUTINE X 2)     Status: None   Collection Time    10/07/13  1:15 AM      Result Value Ref Range Status   Specimen Description BLOOD RIGHT ANTECUBITAL   Final   Special Requests BOTTLES DRAWN AEROBIC ONLY 10CC   Final   Culture  Setup Time     Final   Value: 10/07/2013 08:50     Performed at [facility]   Culture     Final   Value:        BLOOD CULTURE RECEIVED NO GROWTH TO DATE CULTURE WILL BE HELD FOR 5 DAYS BEFORE ISSUING A FINAL NEGATIVE REPORT     Performed at [facility]   Report Status PENDING   Incomplete  CULTURE, BLOOD (ROUTINE X 2)     Status: None   Collection Time    10/07/13  1:20 AM      Result Value Ref Range Status   Specimen Description BLOOD RIGHT FOREARM   Final   Special Requests BOTTLES DRAWN AEROBIC ONLY  10CC   Final   Culture  Setup Time     Final   Value: 10/07/2013 08:50     Performed at [facility]   Culture     Final   Value:        BLOOD CULTURE RECEIVED NO GROWTH TO DATE CULTURE WILL BE HELD FOR 5 DAYS BEFORE ISSUING A FINAL NEGATIVE REPORT     Performed at [facility]   Report Status PENDING   Incomplete  URINE CULTURE     Status: None   Collection Time    10/07/13  6:03 AM      Result Value Ref Range Status   Specimen Description URINE, CATHETERIZED   Final   Special Requests Normal   Final   Culture  Setup Time     Final   Value: 10/07/2013 08:53     Performed at [place]     Final   Value: 40,000 COLONIES/ML     Performed at [facility]   Culture     Final   Value: ESCHERICHIA COLI     Performed at [facility]   Report Status 10/09/2013 FINAL   Final   Organism ID, Bacteria ESCHERICHIA COLI   Final     Studies: No results found.  Scheduled Meds: . cefTRIAXone (ROCEPHIN)  IV  1 g Intravenous Q24H  . feeding supplement (ENSURE COMPLETE)  237 mL Oral TID BM  . heparin  5,000 Units Subcutaneous 3 times per day  . hydrocortisone cream  1 application Topical BID  . pantoprazole  40 mg Oral Daily  . sodium chloride  3 mL Intravenous Q12H   Continuous Infusions: . sodium chloride 0.45 % 1,000 mL with sodium bicarbonate 50 mEq infusion 100 mL/hr at 10/10/13 0300    Active Problems:   GERD   Hydronephrosis   BENIGN PROSTATIC HYPERTROPHY, WITH OBSTRUCTION   Renal failure   Metabolic acidosis   UTI (lower urinary tract infection)   Failure to thrive in adult   Protein-calorie malnutrition, severe    Time spent: >35 minutes     Kinnie Feil  [facility] Pager [phone]. If 7PM-7AM, please contact night-coverage at [email or web address], password [facility] 10/10/2013, 1:23 PM  LOS: 4 days

## 2013-10-10 NOTE — Progress Notes (Signed)
  CARE MANAGEMENT ED NOTE 10/10/2013  Patient:  [name], [name]   Account Number:  [id number]  Date Initiated:  10/10/2013  Documentation initiated by:  Livia Snellen  Subjective/Objective Assessment:   Patient presents to Ed with increased weakness and decreased oral intake     Subjective/Objective Assessment Detail:     Action/Plan:   Action/Plan Detail:   Anticipated DC Date:  10/10/2013     Status Recommendation to Physician:   Result of Recommendation:    Other ED Services  Consult Working [place]  CM consult  Other   [facility] Choice  HOME HEALTH   Choice offered to / List presented to:  C-1 Patient  DME arranged  [place]ford Rile     DME agency  [place] arranged  HH-1 RN  [place]      [name]    Status of service:    ED Comments:   ED Comments Detail:  10/10/2013 [name] RNCM 1806pm EDCM consulted by [place] RN to arrange home health services.  EDCM spoke to patient at bedside.  Patient reports he lives in a boarding home with three other couples.  Patient does not have any medical equipment at home and has never had home health services before. Patient confirms his pcp is located at [facility] Dr. [facility].  System updated.  Patient reports he will need the walker to go home with him.  EDCM called [facility] for delivery of walker this evening to [facility] hospital at 1745pm.  As per [facility], will be able to deliver a walker to patient this evening to hospital if order is faxed over. Patient reports he is need of assistance with bathing, meals and cleaning.  EDCM provided patient with private duty nursing list and explained it would be an out of pocket expense for the patient.  Patient reports, "I know I don't have the money for that."  [facility] spoke to Dr. Daleen Bo to add RN, aide and social worker to home health orders. Dr. Daleen Bo in agreement.  [facility].  [facility] provided patient with phone number to [facility].  EDCM explained to patient that [facility] has 24-48 hours to contact him, if not, he has the phone number to call them.  [facility] faxed over home health orders RN, PT, aide and social worker and walker order to [facility] at [phone].   EDCM received phone call from Arthur of [facility] at 1816pm confirming they have received the fax for walker and home health orders.  [facility] informed patient's unit RN at 1820pm ext [phone] that walker is to be delivered this evening.  EDCM provided unit RN with [facility] phone number to follow up on walker delivery.  Patient thankful for services.  No further EDCM needs at this time.

## 2013-10-10 NOTE — Discharge Summary (Signed)
Physician Discharge Summary  [name] LXB:[id number] DOB: 11-Oct-1940 DOA: 10/06/2013  PCP: No PCP Per Patient  Admit date: 10/06/2013 Discharge date: 10/10/2013  Time spent: >35 minutes  Recommendations for Outpatient Follow-up:  [facility] F/u with nephrology 11/14/13 F/u with PCP in 1 week F/u with urology as scheduled for cath exchange   Discharge Diagnoses:  Active Problems:   GERD   Hydronephrosis   BENIGN PROSTATIC HYPERTROPHY, WITH OBSTRUCTION   Renal failure   Metabolic acidosis   UTI (lower urinary tract infection)   Failure to thrive in adult   Protein-calorie malnutrition, severe   Discharge Condition: stable   Diet recommendation: regular   Filed Weights   10/07/13 0035 10/09/13 0612 10/10/13 0508  Weight: 56.2 kg (123 lb 14.4 oz) 57.5 kg (126 lb 12.2 oz) 56.9 kg (125 lb 7.1 oz)    History of present illness:  73 y/o male with PMH of COPD, GERD, CKD, BPH and adenocarcinoma of the colon s/p chronic suprapubic cath (8 years) presented with weakness, poor oral intake, found to have UTI, sepsis, AKI   Hospital Course:  1. Sepsis/UTI; resolved on IV atx; blood cultures : NGTD; urine culture E coli ; changed to PO atx to compete outpatient treatment regimen; Pt needs close outpatient monitor due to risk of recurrent infection, colonization with permanent suprapubic catheter upon d/c  2. 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 AG met acidosis due to renal failure, with sepsis  -improved on IVF 3. COPD, smoker; CXR: No acute cardiopulmonary process  -cont bronchodilators prn; stop smoking  4. Failure to thrive. cont PT  -mirtazapine to try; age appropriate screening as outpatient; encourage PO intake    Procedures:  none (i.e. Studies not automatically  included, echos, thoracentesis, etc; not x-rays)  Consultations:  Urology, nephrology   Discharge Exam: Filed Vitals:   10/10/13 1410  BP: 140/74  Pulse: 80  Temp: 98 F (36.7 C)  Resp: 20    General: alert Cardiovascular: s1,s2 rrr Respiratory: CTA BL  Discharge Instructions  Discharge Instructions   Diet - low sodium heart healthy    Complete by:  As directed      Discharge instructions    Complete by:  As directed   Please follow up with nephrologist in 4 weeks Please follow up with Primary care doctor in 1 week     Increase activity slowly    Complete by:  As directed             Medication List         cephALEXin 500 MG capsule  Commonly known as:  KEFLEX  Take 1 capsule (500 mg total) by mouth 3 (three) times daily.     ferrous sulfate 325 (65 FE) MG tablet  Take 1 tablet (325 mg total) by mouth daily with breakfast.     hydrocortisone cream 1 %  Apply 1 application topically 2 (two) times daily.     mirtazapine 7.5 MG tablet  Commonly known as:  REMERON  Take 1 tablet (7.5 mg total) by mouth at bedtime.     omeprazole 20 MG capsule  Commonly known as:  PRILOSEC  Take 20 mg by mouth daily.       No Known Allergies     Follow-up Information   Follow up with PATEL,JAY K.,  MD On 11/14/2013. [facility] Aug 31st Monday , please arrive at 12:45 pm, appt with kidney doctor Dr Elmarie Shiley)    Specialty:  Nephrology   Contact information:   [place] [address] [phone]       Follow up with [place]. Schedule an appointment as soon as possible for a visit in 1 week.   Contact information:   [place] [address] [phone]       The results of significant diagnostics from this hospitalization (including imaging, microbiology, ancillary and laboratory) are listed below for reference.    Significant Diagnostic Studies: Dg Chest 2 View  10/06/2013   CLINICAL DATA:  Weakness  and shortness of breath.  EXAM: CHEST  2 VIEW  COMPARISON:  Chest radiograph performed 10/10/2006  FINDINGS: The lungs are well-aerated and clear. There is no evidence of focal opacification, pleural effusion or pneumothorax.  The heart is normal in size; the mediastinal contour is within normal limits. Borderline right hilar prominence appears stable and reflects the patient's baseline. No acute osseous abnormalities are seen. A right-sided nipple shadow is noted.  IMPRESSION: No acute cardiopulmonary process seen.   Electronically Signed   By: Garald Balding M.D.   On: 10/06/2013 22:20   US Renal  10/07/2013   CLINICAL DATA:  Metabolic acidosis. Renal insufficiency with elevated BUN and creatinine. Prior history of colon cancer.  EXAM: RENAL/URINARY TRACT ULTRASOUND COMPLETE  COMPARISON:  Urinary tract ultrasound 10/12/2006.  FINDINGS: Right Kidney:  Length: Approximately 14.8 cm. Severe hydronephrosis. Marked dilation of the proximal ureter. Severe diffuse cortical thinning. Echogenic parenchyma without focal parenchymal abnormality. No shadowing calculi.  Left Kidney:  Length: Approximately 10.4 cm. No hydronephrosis. Well-preserved cortex. Approximate 1.8 x 1.6 x 2.2 cm hypoechoic mass arising from the upper pole with slight acoustic enhancement, not visualized on the prior examination, without internal color Doppler flow. No shadowing calculi.  Bladder:  Decompressed by a suprapubic catheter.  IMPRESSION: 1. Severe right hydronephrosis associated with marked diffuse cortical thinning, consistent with chronic obstruction. 2. Approximate 1.8 cm likely complex cyst with internal debris arising from the upper pole of the left kidney. Since the patient is unable to have IV contrast for CT or MRI due to renal insufficiency, followup urinary tract ultrasound in 3 months is suggested.   Electronically Signed   By: Evangeline Dakin M.D.   On: 10/07/2013 11:49    Microbiology: Recent Results (from the past 240  hour(s))  CULTURE, BLOOD (ROUTINE X 2)     Status: None   Collection Time    10/07/13  1:15 AM      Result Value Ref Range Status   Specimen Description BLOOD RIGHT ANTECUBITAL   Final   Special Requests BOTTLES DRAWN AEROBIC ONLY 10CC   Final   Culture  Setup Time     Final   Value: 10/07/2013 08:50     Performed at [facility]   Culture     Final   Value:        BLOOD CULTURE RECEIVED NO GROWTH TO DATE CULTURE WILL BE HELD FOR 5 DAYS BEFORE ISSUING A FINAL NEGATIVE REPORT     Performed at [facility]   Report Status PENDING   Incomplete  CULTURE, BLOOD (ROUTINE X 2)     Status: None   Collection Time    10/07/13  1:20 AM      Result Value Ref Range Status  Specimen Description BLOOD RIGHT FOREARM   Final   Special Requests BOTTLES DRAWN AEROBIC ONLY 10CC   Final   Culture  Setup Time     Final   Value: 10/07/2013 08:50     Performed at [facility]   Culture     Final   Value:        BLOOD CULTURE RECEIVED NO GROWTH TO DATE CULTURE WILL BE HELD FOR 5 DAYS BEFORE ISSUING A FINAL NEGATIVE REPORT     Performed at [facility]   Report Status PENDING   Incomplete  URINE CULTURE     Status: None   Collection Time    10/07/13  6:03 AM      Result Value Ref Range Status   Specimen Description URINE, CATHETERIZED   Final   Special Requests Normal   Final   Culture  Setup Time     Final   Value: 10/07/2013 08:53     Performed at [facility]     Final   Value: 40,000 COLONIES/ML     Performed at [facility]   Culture     Final   Value: ESCHERICHIA COLI     Performed at [facility]   Report Status 10/09/2013 FINAL   Final   Organism ID, Bacteria ESCHERICHIA COLI   Final     Labs: Basic Metabolic Panel:  Recent Labs Lab 10/06/13 2143 10/07/13 0115 10/08/13 0520 10/09/13 0500 10/10/13 0857  NA 134* 134* 137 138 136*  K 4.9 5.1 4.3 3.8 3.5*  CL 100 104 103 103 97  CO2 11* 10* 16* 21 21   GLUCOSE 105* 97 84 106* 64*  BUN 96* 96* 78* 57* 37*  CREATININE 5.45* 5.05* 4.16* 3.19* 2.54*  CALCIUM 10.4 9.1 9.0 8.6 8.5  PHOS  --   --   --   --  3.1   Liver Function Tests:  Recent Labs Lab 10/06/13 2143 10/10/13 0857  AST 25  --   ALT 26  --   ALKPHOS 118*  --   BILITOT 0.2*  --   PROT 10.1*  --   ALBUMIN 2.9* 2.0*   No results found for this basename: LIPASE, AMYLASE,  in the last 168 hours No results found for this basename: AMMONIA,  in the last 168 hours CBC:  Recent Labs Lab 10/06/13 2143 10/07/13 0115  WBC 15.3* 13.0*  NEUTROABS 12.1*  --   HGB 12.2* 10.3*  HCT 37.6* 32.4*  MCV 75.8* 75.2*  PLT 418* 384   Cardiac Enzymes:  Recent Labs Lab 10/06/13 2143  TROPONINI <0.30   BNP: BNP (last 3 results)  Recent Labs  10/06/13 2143  PROBNP 1216.0*   CBG: No results found for this basename: GLUCAP,  in the last 168 hours     Signed:  Kinnie Feil  [facility] 10/10/2013, 4:31 PM

## 2013-10-10 NOTE — Discharge Instructions (Signed)
Please follow up with nephrologist in 4 weeks Please follow up with Primary care doctor in 1 week

## 2013-10-10 NOTE — Progress Notes (Signed)
Report received from [name], RN. Pt currently D/C'd and awaiting ride home and equipment for D/C home from [place]. I agree with previous RN's assessment. Will continue to monitor the pt. [name] I

## 2013-10-10 NOTE — Progress Notes (Signed)
  [facility] Progress Note   Subjective: creat down to 2.54 today, 2100 cc UOP yest  Filed Vitals:   10/09/13 1221 10/09/13 2140 10/10/13 0508 10/10/13 1410  BP: 127/74 149/74 138/81 140/74  Pulse: 86 81 88 80  Temp: 98.4 F (36.9 C) 98.2 F (36.8 C) 98 F (36.7 C) 98 F (36.7 C)  TempSrc: Oral Oral Oral Oral  Resp: _0 Height:      Weight:   56.9 kg (125 lb 7.1 oz)   SpO2: 99% 100% 100% 100%   Exam: Alert, thin AAM no distress No jvd  Chest clear bilat  RRR no MRG  Abd soft, NTND , no mass or ascites  No LE or UE edema  SP cath in place draining cloudy urine Neuro is Ox3, nf  Date    Creat   eGFR   CKD  2008    6.93 > 1.97  8-34   Acute on CKD III  10/06/13   5.45   9  10/10/13 (today) 2.54  27  ECHO 2008  LV EF reduced at 40-45%  Urine cx +E Coli 40k  UA - turbid, >300 prot, tntc wbc and rbc, many bact Renal US 10/07/13 -- R kidney 14.8 cm, severe hydronephrosis, marked dilation of prox ureter, severe diffuse cortical thinning and echogenic parenchyma w/o stones; L kidney 10.4 cm, no hydronephrosis, well-preserved cortex, cyst x 1, SP cath in bladder   Assessment:  1 Acute on chronic renal failure / chronic nonfunctional obstructed R kidney- acute renal failure from dehydrating illness, resolving with creat down to 2.54 2 CKD unclear baseline, prob stage 3   2 Hx urethral stricture / indwelling SP cath  3 COPD  4 Met acidosis - resolved 5 UTI due to [place]- no new suggestions, will sign off. I have arranged for renal f/u for CKD with [facility], see dc instructions.     Kelly Splinter MD  pager [phone]    cell [phone]  10/10/2013, 2:46 PM     Recent Labs Lab 10/08/13 0520 10/09/13 0500 10/10/13 0857  NA 137 138 136*  K 4.3 3.8 3.5*  CL 103 103 97  CO2 16* 21 21  GLUCOSE 84 106* 64*  BUN 78* 57* 37*  CREATININE 4.16* 3.19* 2.54*  CALCIUM 9.0 8.6 8.5  PHOS  --   --  3.1    Recent Labs Lab 10/06/13 2143 10/10/13 0857   AST 25  --   ALT 26  --   ALKPHOS 118*  --   BILITOT 0.2*  --   PROT 10.1*  --   ALBUMIN 2.9* 2.0*    Recent Labs Lab 10/06/13 2143 10/07/13 0115  WBC 15.3* 13.0*  NEUTROABS 12.1*  --   HGB 12.2* 10.3*  HCT 37.6* 32.4*  MCV 75.8* 75.2*  PLT 418* 384   . cefTRIAXone (ROCEPHIN)  IV  1 g Intravenous Q24H  . feeding supplement (ENSURE COMPLETE)  237 mL Oral TID BM  . heparin  5,000 Units Subcutaneous 3 times per day  . hydrocortisone cream  1 application Topical BID  . mirtazapine  7.5 mg Oral QHS  . pantoprazole  40 mg Oral Daily  . sodium chloride  3 mL Intravenous Q12H   . sodium chloride 0.45 % 1,000 mL with sodium bicarbonate 50 mEq infusion 100 mL/hr at 10/10/13 0300   acetaminophen, acetaminophen, HYDROcodone-acetaminophen, ondansetron (ZOFRAN) IV, traZODone

## 2013-10-10 NOTE — Evaluation (Signed)
Physical Therapy Evaluation Patient Details Name: [name] MRN: [id number] DOB: 11/17/1940 Today's Date: 10/10/2013   History of Present Illness  Pt is a 73 yr old male who came to the ED on 10/06/13 at to urging of his relatives. Patient has had weakness, poor appetite x several days.  The patient hasn't seen a PCP for several years. He does have a chronic indwelling foley catheter that was placed 8 years ago for obstruction. The patient has a history of BPH and adenocarcinoma of the colon.   Clinical Impression  Pt will benefit from PT to address problems listed in chart.    Follow Up Recommendations Home health PT;Supervision/Assistance - 24 hour    Equipment Recommendations  Rolling walker with 5" wheels    Recommendations for Other Services       Precautions / Restrictions Precautions Precautions: Fall Precaution Comments: suprapubic catheter Required Braces or Orthoses:  (inserts for shoes, cannot walk without shoes.)      Mobility  Bed Mobility Overal bed mobility: Independent Bed Mobility: Supine to Sit           General bed mobility comments: extra time to move  self  Transfers Overall transfer level: Needs assistance Equipment used: Rolling walker (2 wheeled) Transfers: Sit to/from Stand           General transfer comment: extra time to  rise from bed and recliner  Ambulation/Gait Ambulation/Gait assistance: Min guard Ambulation Distance (Feet): 100 Feet (and 10) Assistive device: Rolling walker (2 wheeled) Gait Pattern/deviations: Step-through pattern;Trunk flexed;Decreased stride length     General Gait Details: slow pace, pt did tolerate well.  Stairs            Wheelchair Mobility    Modified Rankin (Stroke Patients Only)       Balance                                             Pertinent Vitals/Pain Pt reports his feet hurt    Home Living Family/patient expects to be discharged to:: Other (Comment)  (boarding house) Living Arrangements: Alone;Non-relatives/Friends                    Prior Function Level of Independence: Independent               Hand Dominance        Extremity/Trunk Assessment   Upper Extremity Assessment: Generalized weakness           Lower Extremity Assessment: Generalized weakness         Communication   Communication: No difficulties  Cognition Arousal/Alertness: Awake/alert Behavior During Therapy: WFL for tasks assessed/performed Overall Cognitive Status: Within Functional Limits for tasks assessed                      General Comments      Exercises        Assessment/Plan    PT Assessment Patient needs continued PT services  PT Diagnosis Difficulty walking;Generalized weakness   PT Problem List Decreased strength;Decreased activity tolerance  PT Treatment Interventions DME instruction;Gait training;Functional mobility training;Therapeutic exercise;Patient/family education   PT Goals (Current goals can be found in the Care Plan section) Acute Rehab PT Goals Patient Stated Goal: i want to walk like these other folks PT Goal Formulation: With patient Time For Goal Achievement: 10/24/13 Potential to Achieve  Goals: Good    Frequency Min 3X/week   Barriers to discharge        Co-evaluation               End of Session Equipment Utilized During Treatment: Gait belt Activity Tolerance: Patient tolerated treatment well Patient left: in chair;with call bell/phone within reach;with chair alarm set Nurse Communication: Mobility status         Time: 6962-9528 PT Time Calculation (min): 25 min   Charges:   PT Evaluation $Initial PT Evaluation Tier I: 1 Procedure PT Treatments $Gait Training: 23-37 mins   PT G Codes:          Claretha Cooper 10/10/2013, 4:18 PM Tresa Endo PT [phone]

## 2013-10-10 NOTE — Progress Notes (Signed)
EDCM spoke to unit RN Janett Billow who reports patient received his walker prior to discharge.

## 2013-10-11 LAB — PROTEIN ELECTROPHORESIS, SERUM
Albumin ELP: 34.1 % — ABNORMAL LOW (ref 55.8–66.1)
Alpha-1-Globulin: 9.4 % — ABNORMAL HIGH (ref 2.9–4.9)
Alpha-2-Globulin: 13 % — ABNORMAL HIGH (ref 7.1–11.8)
Beta 2: 11.3 % — ABNORMAL HIGH (ref 3.2–6.5)
Beta Globulin: 6.4 % (ref 4.7–7.2)
GAMMA GLOBULIN: 25.8 % — AB (ref 11.1–18.8)
M-Spike, %: NOT DETECTED g/dL
Total Protein ELP: 6.8 g/dL (ref 6.0–8.3)

## 2013-10-13 LAB — CULTURE, BLOOD (ROUTINE X 2)
Culture: NO GROWTH
Culture: NO GROWTH

## 2014-05-19 ENCOUNTER — Other Ambulatory Visit (HOSPITAL_COMMUNITY): Payer: Self-pay | Admitting: *Deleted

## 2014-05-22 ENCOUNTER — Inpatient Hospital Stay (HOSPITAL_COMMUNITY)
Admission: RE | Admit: 2014-05-22 | Discharge: 2014-05-22 | Disposition: A | Discharge status: Home / Self Care | Payer: Medicare Other | Source: Ambulatory Visit | Attending: Nephrology | Admitting: Nephrology

## 2014-05-22 NOTE — Discharge Instructions (Signed)

## 2014-06-05 ENCOUNTER — Encounter (HOSPITAL_COMMUNITY): Payer: Medicare Other

## 2014-07-10 ENCOUNTER — Other Ambulatory Visit (HOSPITAL_COMMUNITY): Payer: Self-pay

## 2014-07-10 ENCOUNTER — Inpatient Hospital Stay (HOSPITAL_COMMUNITY)
Admission: EM | Admit: 2014-07-10 | Discharge: 2014-07-20 | DRG: 854 | Disposition: A | Discharge status: Hospice | Payer: Medicare Other | Attending: Internal Medicine | Admitting: Internal Medicine

## 2014-07-10 ENCOUNTER — Encounter (HOSPITAL_COMMUNITY): Payer: Self-pay | Admitting: Emergency Medicine

## 2014-07-10 ENCOUNTER — Emergency Department (HOSPITAL_COMMUNITY): Payer: Medicare Other

## 2014-07-10 DIAGNOSIS — C801 Malignant (primary) neoplasm, unspecified: Secondary | ICD-10-CM | POA: Diagnosis present

## 2014-07-10 DIAGNOSIS — J90 Pleural effusion, not elsewhere classified: Secondary | ICD-10-CM | POA: Diagnosis present

## 2014-07-10 DIAGNOSIS — N390 Urinary tract infection, site not specified: Secondary | ICD-10-CM | POA: Diagnosis present

## 2014-07-10 DIAGNOSIS — Z87440 Personal history of urinary (tract) infections: Secondary | ICD-10-CM

## 2014-07-10 DIAGNOSIS — C7989 Secondary malignant neoplasm of other specified sites: Secondary | ICD-10-CM | POA: Diagnosis not present

## 2014-07-10 DIAGNOSIS — E876 Hypokalemia: Secondary | ICD-10-CM | POA: Diagnosis not present

## 2014-07-10 DIAGNOSIS — Z515 Encounter for palliative care: Secondary | ICD-10-CM

## 2014-07-10 DIAGNOSIS — D638 Anemia in other chronic diseases classified elsewhere: Secondary | ICD-10-CM | POA: Diagnosis present

## 2014-07-10 DIAGNOSIS — A4151 Sepsis due to Escherichia coli [E. coli]: Principal | ICD-10-CM | POA: Diagnosis present

## 2014-07-10 DIAGNOSIS — F17210 Nicotine dependence, cigarettes, uncomplicated: Secondary | ICD-10-CM | POA: Diagnosis present

## 2014-07-10 DIAGNOSIS — E860 Dehydration: Secondary | ICD-10-CM | POA: Insufficient documentation

## 2014-07-10 DIAGNOSIS — J449 Chronic obstructive pulmonary disease, unspecified: Secondary | ICD-10-CM | POA: Diagnosis present

## 2014-07-10 DIAGNOSIS — E874 Mixed disorder of acid-base balance: Secondary | ICD-10-CM | POA: Diagnosis present

## 2014-07-10 DIAGNOSIS — M25559 Pain in unspecified hip: Secondary | ICD-10-CM

## 2014-07-10 DIAGNOSIS — K219 Gastro-esophageal reflux disease without esophagitis: Secondary | ICD-10-CM | POA: Diagnosis present

## 2014-07-10 DIAGNOSIS — N400 Enlarged prostate without lower urinary tract symptoms: Secondary | ICD-10-CM | POA: Diagnosis present

## 2014-07-10 DIAGNOSIS — T380X5A Adverse effect of glucocorticoids and synthetic analogues, initial encounter: Secondary | ICD-10-CM | POA: Diagnosis present

## 2014-07-10 DIAGNOSIS — Z86010 Personal history of colonic polyps: Secondary | ICD-10-CM

## 2014-07-10 DIAGNOSIS — C7951 Secondary malignant neoplasm of bone: Secondary | ICD-10-CM | POA: Diagnosis present

## 2014-07-10 DIAGNOSIS — Z9049 Acquired absence of other specified parts of digestive tract: Secondary | ICD-10-CM | POA: Diagnosis present

## 2014-07-10 DIAGNOSIS — E46 Unspecified protein-calorie malnutrition: Secondary | ICD-10-CM | POA: Diagnosis present

## 2014-07-10 DIAGNOSIS — C189 Malignant neoplasm of colon, unspecified: Secondary | ICD-10-CM | POA: Insufficient documentation

## 2014-07-10 DIAGNOSIS — Z79899 Other long term (current) drug therapy: Secondary | ICD-10-CM

## 2014-07-10 DIAGNOSIS — E875 Hyperkalemia: Secondary | ICD-10-CM | POA: Diagnosis present

## 2014-07-10 DIAGNOSIS — Z936 Other artificial openings of urinary tract status: Secondary | ICD-10-CM | POA: Diagnosis not present

## 2014-07-10 DIAGNOSIS — E872 Acidosis, unspecified: Secondary | ICD-10-CM | POA: Diagnosis present

## 2014-07-10 DIAGNOSIS — Z681 Body mass index (BMI) 19 or less, adult: Secondary | ICD-10-CM

## 2014-07-10 DIAGNOSIS — R64 Cachexia: Secondary | ICD-10-CM | POA: Diagnosis present

## 2014-07-10 DIAGNOSIS — N189 Chronic kidney disease, unspecified: Secondary | ICD-10-CM | POA: Diagnosis not present

## 2014-07-10 DIAGNOSIS — M899 Disorder of bone, unspecified: Secondary | ICD-10-CM | POA: Diagnosis not present

## 2014-07-10 DIAGNOSIS — C799 Secondary malignant neoplasm of unspecified site: Secondary | ICD-10-CM | POA: Insufficient documentation

## 2014-07-10 DIAGNOSIS — N184 Chronic kidney disease, stage 4 (severe): Secondary | ICD-10-CM | POA: Diagnosis present

## 2014-07-10 DIAGNOSIS — R32 Unspecified urinary incontinence: Secondary | ICD-10-CM | POA: Diagnosis present

## 2014-07-10 DIAGNOSIS — N368 Other specified disorders of urethra: Secondary | ICD-10-CM | POA: Diagnosis present

## 2014-07-10 DIAGNOSIS — Y846 Urinary catheterization as the cause of abnormal reaction of the patient, or of later complication, without mention of misadventure at the time of the procedure: Secondary | ICD-10-CM | POA: Diagnosis present

## 2014-07-10 DIAGNOSIS — T8351XA Infection and inflammatory reaction due to indwelling urinary catheter, initial encounter: Secondary | ICD-10-CM | POA: Diagnosis present

## 2014-07-10 DIAGNOSIS — G629 Polyneuropathy, unspecified: Secondary | ICD-10-CM | POA: Diagnosis present

## 2014-07-10 DIAGNOSIS — D72829 Elevated white blood cell count, unspecified: Secondary | ICD-10-CM | POA: Diagnosis present

## 2014-07-10 DIAGNOSIS — C7800 Secondary malignant neoplasm of unspecified lung: Secondary | ICD-10-CM | POA: Diagnosis present

## 2014-07-10 DIAGNOSIS — A419 Sepsis, unspecified organism: Secondary | ICD-10-CM | POA: Diagnosis present

## 2014-07-10 DIAGNOSIS — N139 Obstructive and reflux uropathy, unspecified: Secondary | ICD-10-CM | POA: Diagnosis present

## 2014-07-10 DIAGNOSIS — N179 Acute kidney failure, unspecified: Secondary | ICD-10-CM | POA: Diagnosis present

## 2014-07-10 DIAGNOSIS — C779 Secondary and unspecified malignant neoplasm of lymph node, unspecified: Secondary | ICD-10-CM | POA: Diagnosis present

## 2014-07-10 DIAGNOSIS — D509 Iron deficiency anemia, unspecified: Secondary | ICD-10-CM | POA: Diagnosis present

## 2014-07-10 DIAGNOSIS — N359 Urethral stricture, unspecified: Secondary | ICD-10-CM | POA: Diagnosis present

## 2014-07-10 DIAGNOSIS — Z85038 Personal history of other malignant neoplasm of large intestine: Secondary | ICD-10-CM

## 2014-07-10 DIAGNOSIS — E162 Hypoglycemia, unspecified: Secondary | ICD-10-CM | POA: Diagnosis present

## 2014-07-10 DIAGNOSIS — Z7189 Other specified counseling: Secondary | ICD-10-CM | POA: Diagnosis not present

## 2014-07-10 DIAGNOSIS — I129 Hypertensive chronic kidney disease with stage 1 through stage 4 chronic kidney disease, or unspecified chronic kidney disease: Secondary | ICD-10-CM | POA: Diagnosis present

## 2014-07-10 DIAGNOSIS — R531 Weakness: Secondary | ICD-10-CM

## 2014-07-10 DIAGNOSIS — M25551 Pain in right hip: Secondary | ICD-10-CM | POA: Diagnosis not present

## 2014-07-10 DIAGNOSIS — I2510 Atherosclerotic heart disease of native coronary artery without angina pectoris: Secondary | ICD-10-CM | POA: Diagnosis present

## 2014-07-10 DIAGNOSIS — Z66 Do not resuscitate: Secondary | ICD-10-CM | POA: Diagnosis present

## 2014-07-10 DIAGNOSIS — M898X9 Other specified disorders of bone, unspecified site: Secondary | ICD-10-CM | POA: Insufficient documentation

## 2014-07-10 DIAGNOSIS — N19 Unspecified kidney failure: Secondary | ICD-10-CM | POA: Diagnosis not present

## 2014-07-10 DIAGNOSIS — C7949 Secondary malignant neoplasm of other parts of nervous system: Secondary | ICD-10-CM

## 2014-07-10 DIAGNOSIS — D649 Anemia, unspecified: Secondary | ICD-10-CM | POA: Diagnosis present

## 2014-07-10 DIAGNOSIS — R52 Pain, unspecified: Secondary | ICD-10-CM

## 2014-07-10 DIAGNOSIS — C3490 Malignant neoplasm of unspecified part of unspecified bronchus or lung: Secondary | ICD-10-CM | POA: Insufficient documentation

## 2014-07-10 DIAGNOSIS — B9620 Unspecified Escherichia coli [E. coli] as the cause of diseases classified elsewhere: Secondary | ICD-10-CM | POA: Diagnosis not present

## 2014-07-10 LAB — CBG MONITORING, ED
GLUCOSE-CAPILLARY: 30 mg/dL — AB (ref 70–99)
GLUCOSE-CAPILLARY: 32 mg/dL — AB (ref 70–99)
Glucose-Capillary: 165 mg/dL — ABNORMAL HIGH (ref 70–99)
Glucose-Capillary: 83 mg/dL (ref 70–99)

## 2014-07-10 LAB — URINALYSIS, ROUTINE W REFLEX MICROSCOPIC
BILIRUBIN URINE: NEGATIVE
Glucose, UA: NEGATIVE mg/dL
Ketones, ur: NEGATIVE mg/dL
Nitrite: NEGATIVE
Protein, ur: >300 mg/dL — AB
SPECIFIC GRAVITY, URINE: 1.026 (ref 1.005–1.030)
UROBILINOGEN UA: 0.2 mg/dL (ref 0.0–1.0)
pH: 7 (ref 5.0–8.0)

## 2014-07-10 LAB — COMPREHENSIVE METABOLIC PANEL
ALT: 13 U/L (ref 0–53)
ANION GAP: 17 — AB (ref 5–15)
AST: 18 U/L (ref 0–37)
Albumin: 2.8 g/dL — ABNORMAL LOW (ref 3.5–5.2)
Alkaline Phosphatase: 69 U/L (ref 39–117)
BILIRUBIN TOTAL: 0.7 mg/dL (ref 0.3–1.2)
BUN: 100 mg/dL — AB (ref 6–23)
CHLORIDE: 106 mmol/L (ref 96–112)
CO2: 15 mmol/L — ABNORMAL LOW (ref 19–32)
Calcium: 13.5 mg/dL (ref 8.4–10.5)
Creatinine, Ser: 5.88 mg/dL — ABNORMAL HIGH (ref 0.50–1.35)
GFR calc Af Amer: 10 mL/min — ABNORMAL LOW (ref >90)
GFR calc non Af Amer: 8 mL/min — ABNORMAL LOW (ref >90)
Glucose, Bld: 52 mg/dL — ABNORMAL LOW (ref 70–99)
POTASSIUM: 5.9 mmol/L — AB (ref 3.5–5.1)
Sodium: 138 mmol/L (ref 135–145)
Total Protein: 8.9 g/dL — ABNORMAL HIGH (ref 6.0–8.3)

## 2014-07-10 LAB — CBC WITH DIFFERENTIAL/PLATELET
BASOS ABS: 0 10*3/uL (ref 0.0–0.1)
Basophils Relative: 0 % (ref 0–1)
EOS PCT: 0 % (ref 0–5)
Eosinophils Absolute: 0 10*3/uL (ref 0.0–0.7)
HCT: 33.6 % — ABNORMAL LOW (ref 39.0–52.0)
HEMOGLOBIN: 10.2 g/dL — AB (ref 13.0–17.0)
Lymphocytes Relative: 6 % — ABNORMAL LOW (ref 12–46)
Lymphs Abs: 0.8 10*3/uL (ref 0.7–4.0)
MCH: 22 pg — ABNORMAL LOW (ref 26.0–34.0)
MCHC: 30.4 g/dL (ref 30.0–36.0)
MCV: 72.4 fL — ABNORMAL LOW (ref 78.0–100.0)
MONO ABS: 1.1 10*3/uL — AB (ref 0.1–1.0)
Monocytes Relative: 9 % (ref 3–12)
NEUTROS PCT: 85 % — AB (ref 43–77)
Neutro Abs: 10.7 10*3/uL — ABNORMAL HIGH (ref 1.7–7.7)
Platelets: 461 10*3/uL — ABNORMAL HIGH (ref 150–400)
RBC: 4.64 MIL/uL (ref 4.22–5.81)
RDW: 15.8 % — ABNORMAL HIGH (ref 11.5–15.5)
WBC: 12.6 10*3/uL — ABNORMAL HIGH (ref 4.0–10.5)

## 2014-07-10 LAB — LACTIC ACID, PLASMA
LACTIC ACID, VENOUS: 2.2 mmol/L — AB (ref 0.5–2.0)
Lactic Acid, Venous: 3.1 mmol/L (ref 0.5–2.0)

## 2014-07-10 LAB — URINE MICROSCOPIC-ADD ON

## 2014-07-10 LAB — GLUCOSE, CAPILLARY
GLUCOSE-CAPILLARY: 47 mg/dL — AB (ref 70–99)
GLUCOSE-CAPILLARY: 101 mg/dL — AB (ref 70–99)

## 2014-07-10 LAB — PHOSPHORUS: Phosphorus: 3.8 mg/dL (ref 2.3–4.6)

## 2014-07-10 LAB — MRSA PCR SCREENING: MRSA by PCR: NEGATIVE

## 2014-07-10 MED ORDER — HYDROMORPHONE HCL 1 MG/ML IJ SOLN
0.5000 mg | INTRAMUSCULAR | Status: DC | PRN
  Administered 2014-07-11 – 2014-07-19 (×7): 0.5 mg via INTRAVENOUS
  Filled 2014-07-10 (×6): qty 1

## 2014-07-10 MED ORDER — SODIUM CHLORIDE 0.9 % IV SOLN: 1.0000 g | Freq: Once | INTRAVENOUS | Status: DC

## 2014-07-10 MED ORDER — SODIUM CHLORIDE 0.9 % IV BOLUS (SEPSIS)
1000.0000 mL | Freq: Once | INTRAVENOUS | Status: AC
  Administered 2014-07-10: 1000 mL via INTRAVENOUS

## 2014-07-10 MED ORDER — CEFEPIME HCL 2 G IJ SOLR
2.0000 g | Freq: Once | INTRAMUSCULAR | Status: AC
  Administered 2014-07-10: 2 g via INTRAVENOUS
  Filled 2014-07-10: qty 2

## 2014-07-10 MED ORDER — DEXTROSE 50 % IV SOLN
INTRAVENOUS | Status: AC
  Administered 2014-07-10: 50 mL
  Filled 2014-07-10: qty 50

## 2014-07-10 MED ORDER — ACETAMINOPHEN 650 MG RE SUPP: 650.0000 mg | Freq: Four times a day (QID) | RECTAL | Status: DC | PRN

## 2014-07-10 MED ORDER — MIRTAZAPINE 15 MG PO TABS: 7.5000 mg | ORAL_TABLET | Freq: Every day | ORAL | Status: DC

## 2014-07-10 MED ORDER — HYDROMORPHONE HCL 1 MG/ML IJ SOLN: 0.5000 mg | INTRAMUSCULAR | Status: DC | PRN

## 2014-07-10 MED ORDER — ONDANSETRON HCL 4 MG PO TABS
4.0000 mg | ORAL_TABLET | Freq: Four times a day (QID) | ORAL | Status: DC | PRN
  Administered 2014-07-14 – 2014-07-16 (×3): 4 mg via ORAL
  Filled 2014-07-10 (×3): qty 1

## 2014-07-10 MED ORDER — ONDANSETRON HCL 4 MG/2ML IJ SOLN: 4.0000 mg | Freq: Four times a day (QID) | INTRAMUSCULAR | Status: DC | PRN

## 2014-07-10 MED ORDER — HEPARIN SODIUM (PORCINE) 5000 UNIT/ML IJ SOLN
5000.0000 [IU] | Freq: Three times a day (TID) | INTRAMUSCULAR | Status: DC
  Administered 2014-07-10 – 2014-07-15 (×14): 5000 [IU] via SUBCUTANEOUS
  Filled 2014-07-10 (×18): qty 1

## 2014-07-10 MED ORDER — SODIUM CHLORIDE 0.9 % IV SOLN: INTRAVENOUS | Status: DC

## 2014-07-10 MED ORDER — ACETAMINOPHEN 325 MG PO TABS
650.0000 mg | ORAL_TABLET | Freq: Four times a day (QID) | ORAL | Status: DC | PRN
  Administered 2014-07-11 – 2014-07-12 (×2): 650 mg via ORAL
  Filled 2014-07-10 (×2): qty 2

## 2014-07-10 MED ORDER — SODIUM BICARBONATE 650 MG PO TABS: 650.0000 mg | ORAL_TABLET | Freq: Two times a day (BID) | ORAL | Status: DC

## 2014-07-10 MED ORDER — DEXTROSE 50 % IV SOLN
50.0000 mL | Freq: Once | INTRAVENOUS | Status: AC
  Administered 2014-07-10: 50 mL via INTRAVENOUS

## 2014-07-10 MED ORDER — SODIUM BICARBONATE 8.4 % IV SOLN
INTRAVENOUS | Status: DC
  Administered 2014-07-10 – 2014-07-12 (×5): via INTRAVENOUS
  Filled 2014-07-10 (×10): qty 150

## 2014-07-10 MED ORDER — INSULIN ASPART 100 UNIT/ML ~~LOC~~ SOLN
10.0000 [IU] | Freq: Once | SUBCUTANEOUS | Status: AC
  Administered 2014-07-10: 10 [IU] via SUBCUTANEOUS
  Filled 2014-07-10: qty 1

## 2014-07-10 MED ORDER — DEXTROSE-NACL 5-0.9 % IV SOLN
INTRAVENOUS | Status: DC
  Administered 2014-07-10: 14:00:00 via INTRAVENOUS

## 2014-07-10 MED ORDER — HYDROMORPHONE HCL 1 MG/ML IJ SOLN
0.5000 mg | INTRAMUSCULAR | Status: DC | PRN
  Administered 2014-07-10: 0.5 mg via INTRAVENOUS
  Filled 2014-07-10: qty 1

## 2014-07-10 MED ORDER — SODIUM POLYSTYRENE SULFONATE 15 GM/60ML PO SUSP
30.0000 g | Freq: Once | ORAL | Status: AC
  Administered 2014-07-10: 30 g via ORAL
  Filled 2014-07-10: qty 120

## 2014-07-10 MED ORDER — TROLAMINE SALICYLATE 10 % EX CREA: TOPICAL_CREAM | CUTANEOUS | Status: DC | PRN

## 2014-07-10 MED ORDER — DEXTROSE 50 % IV SOLN
INTRAVENOUS | Status: AC
  Administered 2014-07-10: 50 mL via INTRAVENOUS
  Filled 2014-07-10: qty 50

## 2014-07-10 MED ORDER — SODIUM CHLORIDE 0.9 % IV BOLUS (SEPSIS)
1000.0000 mL | INTRAVENOUS | Status: AC
  Administered 2014-07-10 (×2): 1000 mL via INTRAVENOUS

## 2014-07-10 MED ORDER — DEXTROSE 5 % IV SOLN
500.0000 mg | INTRAVENOUS | Status: DC
  Administered 2014-07-11 – 2014-07-12 (×2): 500 mg via INTRAVENOUS
  Filled 2014-07-10 (×3): qty 0.5

## 2014-07-10 MED ORDER — DEXTROSE 50 % IV SOLN
1.0000 | Freq: Once | INTRAVENOUS | Status: AC
  Administered 2014-07-10: 50 mL via INTRAVENOUS
  Filled 2014-07-10: qty 50

## 2014-07-10 MED ORDER — MUSCLE RUB 10-15 % EX CREA
TOPICAL_CREAM | CUTANEOUS | Status: DC | PRN
  Administered 2014-07-10: 1 via TOPICAL
  Administered 2014-07-11: 06:00:00 via TOPICAL
  Filled 2014-07-10 (×2): qty 85

## 2014-07-10 NOTE — ED Notes (Addendum)
Per EMS pt has indwelling catheter and hx of UTI. Over the last two weeks pt began getting increasingly weak, not getting out of bed, and not eating. On arrival EMS found patient to be hypotensive with a palpated pressure at 78 systolic. They gave 500 ml bolus of NS, BP last taken at 108/78. Brought in from a boarding home at [address]

## 2014-07-10 NOTE — ED Notes (Signed)
Unable to get blood cultures at this time.  I made nurse and MD aware.

## 2014-07-10 NOTE — Significant Event (Signed)
CRITICAL VALUE ALERT  Critical value received:  Lactic acid 3.1  Date of notification: 07/09/2013  Time of notification:  5053  Critical value read back: Yes  Nurse who received alert: [place] Liz, RN  MD notified (1st page): Dr. Darrick Meigs   Time of first page:  1845  MD notified (2nd page):  Time of second page:  Responding MD: Dr. Darrick Meigs   Time MD responded:  1846  MD made aware of lactic acid of 3.1 as well as hypoglycemic episodes, with lowest CBG 21. Patient alert/oriented X 4, talking.   Per MD continue with present orders.

## 2014-07-10 NOTE — H&P (Addendum)
PCP:   Dorcas Mcmurray, MD   Chief Complaint:  Weakness  HPI:  74 year old male with a history of COPD, chronic indwelling suprapubic catheter managed by urology Dr Janice Norrie, who came to the hospital with chief complaint of weakness, leg pain, poor by mouth intake. As per patient he did not get the suprapubic catheter changed so he was 2 weeks behind the schedule. He forgot to make an appointment. Patient denies any fever at home, no nausea vomiting or diarrhea. No chest pain or shortness of breath. In the ED patient was found to be hypotensive, received IV fluids. Suprapubic catheter was found to be draining pus, the catheter was changed. Patient also was found to be hypoglycemic with blood sugar in 30s and was given 1 ampule of D50  Pat ient also found to be in acute kidney injury. Patient does have a history of underlying C KD stage IV with baseline creatinine around 2.5. Patient also found to have hyperkalemia as well as hypercalcemia with calcium 13.5   Allergies:  No Known Allergies    Past Medical History  Diagnosis Date  . COPD (chronic obstructive pulmonary disease)     Past Surgical History  Procedure Laterality Date  . Abdominal surgery      Prior to Admission medications   Medication Sig Start Date End Date Taking? Authorizing Provider  ferrous sulfate 325 (65 FE) MG tablet Take 1 tablet (325 mg total) by mouth daily with breakfast. 10/10/13  Yes Kinnie Feil, MD  omeprazole (PRILOSEC) 20 MG capsule Take 20 mg by mouth daily.   Yes Historical Provider, MD  sodium bicarbonate 650 MG tablet Take 1 tablet by mouth 2 (two) times daily. 05/24/14  Yes Historical Provider, MD  trolamine salicylate (ASPERCREME) 10 % cream Apply 1 application topically as needed for muscle pain.   Yes Historical Provider, MD  cephALEXin (KEFLEX) 500 MG capsule Take 1 capsule (500 mg total) by mouth 3 (three) times daily. 10/10/13   Kinnie Feil, MD  mirtazapine (REMERON) 7.5 MG tablet Take 1 tablet  (7.5 mg total) by mouth at bedtime. 10/10/13   Kinnie Feil, MD    Social History:  reports that he has been smoking.  He does not have any smokeless tobacco history on file. He reports that he drinks alcohol. His drug history is not on file.  Family history- no family history of cancer or heart disease  All the positives are listed in BOLD  Review of Systems:  HEENT: Headache, blurred vision, runny nose, sore throat Neck: Hypothyroidism, hyperthyroidism,,lymphadenopathy Chest : Shortness of breath, history of COPD, Asthma Heart : Chest pain, history of coronary arterey disease GI:  Nausea, vomiting, diarrhea, constipation, GERD GU: Dysuria, urgency, frequency of urination, hematuria Neuro: Stroke, seizures, syncope Psych: Depression, anxiety, hallucinations   Physical Exam: Blood pressure 99/56, pulse 94, temperature 97.4 F (36.3 C), temperature source Rectal, resp. rate 21, weight 52.164 kg (115 lb), SpO2 100 %. Constitutional:   Patient is a malnourished male* in no acute distress and cooperative with exam. Head: Normocephalic and atraumatic Mouth: Mucus membranes moist Eyes: PERRL, EOMI, conjunctivae normal Neck: Supple, No Thyromegaly Cardiovascular: RRR, S1 normal, S2 normal Pulmonary/Chest: CTAB, no wheezes, rales, or rhonchi Abdominal: Soft. Non-tender, suprapubic catheter in place bowel sounds are normal, no masses, organomegaly, or guarding present.  Neurological: A&O x3, Strength is normal and symmetric bilaterally, cranial nerve II-XII are grossly intact, no focal motor deficit, sensory intact to light touch bilaterally.  Extremities : No  Cyanosis, Clubbing or Edema  Labs on Admission:  Basic Metabolic Panel:  Recent Labs Lab 07/10/14 1257  NA 138  K 5.9*  CL 106  CO2 15*  GLUCOSE 52*  BUN 100*  CREATININE 5.88*  CALCIUM 13.5*   Liver Function Tests:  Recent Labs Lab 07/10/14 1257  AST 18  ALT 13  ALKPHOS 69  BILITOT 0.7  PROT 8.9*  ALBUMIN  2.8*   No results for input(s): LIPASE, AMYLASE in the last 168 hours. No results for input(s): AMMONIA in the last 168 hours. CBC:  Recent Labs Lab 07/10/14 1257  WBC 12.6*  NEUTROABS 10.7*  HGB 10.2*  HCT 33.6*  MCV 72.4*  PLT 461*   Cardiac Enzymes: No results for input(s): CKTOTAL, CKMB, CKMBINDEX, TROPONINI in the last 168 hours.  BNP (last 3 results) No results for input(s): BNP in the last 8760 hours.  ProBNP (last 3 results)  Recent Labs  10/06/13 2143  PROBNP 1216.0*    CBG:  Recent Labs Lab 07/10/14 1302 07/10/14 1304 07/10/14 1326 07/10/14 1410  GLUCAP 32* 30* 165* 83    Radiological Exams on Admission: Dg Chest Port 1 View  07/10/2014   CLINICAL DATA:  Weakness.  Urinary incontinence  EXAM: PORTABLE CHEST - 1 VIEW  COMPARISON:  10/06/2013  FINDINGS: Hyperinflation and possible emphysematous change. There are bilateral nipple shadows which are symmetric. There is no edema, consolidation, effusion, or pneumothorax. Normal heart size and aortic contours.  IMPRESSION: COPD without acute superimposed disease.   Electronically Signed   By: Monte Fantasia M.D.   On: 07/10/2014 14:02    EKG: Independently reviewed. Normal sinus rhythm  Assessment/Plan Active Problems:   Renal failure   Metabolic acidosis   UTI (lower urinary tract infection)   Sepsis  Sepsis due to UTI  Patient presented with pus draining out of the suprapubic catheter , UA and culture have been ordered by the ED physician .not collected yet. We'll start the patient on cefepime per pharmacy consultation follow the urine culture results. Suprapubic catheter has been changed   Acute kidney injury on CK D stage IV  Patient presents with worsening renal function today creatinine is 5.88. BUN 100 his baseline as of July 2015 was 37/2.54. We'll start the patient on normal saline at 105 mL per hour. Nephrology is being consulted by the ED physician.  Metabolic acidosis Patient has CO2 15,  lactic acid was 2.2. Continue sodium bicarbonate tablets  Hypercalcemia Patient presents with calcium 13.5, with albumin of 2.8, the corrected calcium is 14.5. Continue IV hydration. Follow calcium in a.m.  Hyperkalemia Patient also has potassium of 5.9 secondary to renal failure. Will give 1 dose of Kayexalate 30 g by mouth 1 and follow BMP in a.m.   DVT prophylaxis Heparin  Code status: Full code  Family discussion: No family at bedside   Time Spent on Admission: 60 minutes Yurem Viner S [facility] Pager: [phone] 07/10/2014, 2:52 PM  If 7PM-7AM, please contact night-coverage  [email or web address]  Password [email or web address]

## 2014-07-10 NOTE — ED Provider Notes (Signed)
CSN: [id number]     Arrival date & time 07/10/14  1221 History   First MD Initiated Contact with Patient 07/10/14 1242     Chief Complaint  Patient presents with  . Weakness  . Urinary Incontinence    Leaking indwelling catheter     (Consider location/radiation/quality/duration/timing/severity/associated sxs/prior Treatment) HPI  This is a 74 year old male with history of COPD and chronic indwelling suprapubic catheter managed by Dr. Janice Norrie who presents with possible UTI. Patient reports 2 week history of generalized weakness, decreased by mouth intake, and decreased urine output. Reports that he was supposed to have his Foley catheter changed last week but did not make the appointment. Denies any fevers at home.  Per EMS report, patient hypertensive en route and received 500 mL bolus with improvement of blood pressure. Patient denies any chest pain, shortness of breath, abdominal pain. Patient is a generally poor historian.   Past Medical History  Diagnosis Date  . COPD (chronic obstructive pulmonary disease)    Past Surgical History  Procedure Laterality Date  . Abdominal surgery     History reviewed. No pertinent family history. History  Substance Use Topics  . Smoking status: Current Every Day Smoker  . Smokeless tobacco: Not on file  . Alcohol Use: Yes    Review of Systems  Constitutional: Positive for appetite change and fatigue. Negative for fever.  Respiratory: Negative.  Negative for chest tightness and shortness of breath.   Cardiovascular: Negative.  Negative for chest pain.  Gastrointestinal: Negative.  Negative for nausea, vomiting and abdominal pain.  Genitourinary: Negative.  Negative for dysuria.  Musculoskeletal: Negative for back pain.  Neurological: Negative for weakness and headaches.  All other systems reviewed and are negative.     Allergies  Review of patient's allergies indicates no known allergies.  Home Medications   Prior to Admission  medications   Medication Sig Start Date End Date Taking? Authorizing Provider  ferrous sulfate 325 (65 FE) MG tablet Take 1 tablet (325 mg total) by mouth daily with breakfast. 10/10/13  Yes Kinnie Feil, MD  omeprazole (PRILOSEC) 20 MG capsule Take 20 mg by mouth daily.   Yes Historical Provider, MD  sodium bicarbonate 650 MG tablet Take 1 tablet by mouth 2 (two) times daily. 05/24/14  Yes Historical Provider, MD  trolamine salicylate (ASPERCREME) 10 % cream Apply 1 application topically as needed for muscle pain.   Yes Historical Provider, MD  cephALEXin (KEFLEX) 500 MG capsule Take 1 capsule (500 mg total) by mouth 3 (three) times daily. 10/10/13   Kinnie Feil, MD  mirtazapine (REMERON) 7.5 MG tablet Take 1 tablet (7.5 mg total) by mouth at bedtime. 10/10/13   Kinnie Feil, MD   BP 104/60 mmHg  Pulse 94  Temp(Src) 97.4 F (36.3 C) (Rectal)  Resp 16  Wt 115 lb (52.164 kg)  SpO2 100% Physical Exam  Constitutional: He is oriented to person, place, and time.  Chronically ill-appearing, no acute distress  HENT:  Head: Normocephalic and atraumatic.  Mucous membranes dry  Eyes: Pupils are equal, round, and reactive to light.  Neck: Neck supple.  Cardiovascular: Normal rate, regular rhythm and normal heart sounds.   No murmur heard. Pulmonary/Chest: Effort normal and breath sounds normal. No respiratory distress. He has no wheezes.  Abdominal: Soft. Bowel sounds are normal. There is tenderness. There is no rebound.  Frank pus from suprapubic catheter site, no adjacent erythema or fluctuance noted, mild tenderness to palpation  Musculoskeletal: He exhibits no  edema.  Neurological: He is alert and oriented to person, place, and time.  Skin: Skin is warm and dry.  Poor skin turgor, very dry  Psychiatric: He has a normal mood and affect.  Nursing note and vitals reviewed.   ED Course  Procedures (including critical care time)  CRITICAL CARE Performed by: Merryl Hacker   Total critical care time: 60 min  Critical care time was exclusive of separately billable procedures and treating other patients.  Critical care was necessary to treat or prevent imminent or life-threatening deterioration.  Critical care was time spent personally by me on the following activities: development of treatment plan with patient and/or surrogate as well as nursing, discussions with consultants, evaluation of patient's response to treatment, examination of patient, obtaining history from patient or surrogate, ordering and performing treatments and interventions, ordering and review of laboratory studies, ordering and review of radiographic studies, pulse oximetry and re-evaluation of patient's condition.  Labs Review Labs Reviewed  CBC WITH DIFFERENTIAL/PLATELET - Abnormal; Notable for the following:    WBC 12.6 (*)    Hemoglobin 10.2 (*)    HCT 33.6 (*)    MCV 72.4 (*)    MCH 22.0 (*)    RDW 15.8 (*)    Platelets 461 (*)    Neutrophils Relative % 85 (*)    Lymphocytes Relative 6 (*)    Neutro Abs 10.7 (*)    Monocytes Absolute 1.1 (*)    All other components within normal limits  COMPREHENSIVE METABOLIC PANEL - Abnormal; Notable for the following:    Potassium 5.9 (*)    CO2 15 (*)    Glucose, Bld 52 (*)    BUN 100 (*)    Creatinine, Ser 5.88 (*)    Calcium 13.5 (*)    Total Protein 8.9 (*)    Albumin 2.8 (*)    GFR calc non Af Amer 8 (*)    GFR calc Af Amer 10 (*)    Anion gap 17 (*)    All other components within normal limits  LACTIC ACID, PLASMA - Abnormal; Notable for the following:    Lactic Acid, Venous 2.2 (*)    All other components within normal limits  CBG MONITORING, ED - Abnormal; Notable for the following:    Glucose-Capillary 32 (*)    All other components within normal limits  CBG MONITORING, ED - Abnormal; Notable for the following:    Glucose-Capillary 30 (*)    All other components within normal limits  CBG MONITORING, ED - Abnormal;  Notable for the following:    Glucose-Capillary 165 (*)    All other components within normal limits  URINE CULTURE  CULTURE, BLOOD (ROUTINE X 2)  CULTURE, BLOOD (ROUTINE X 2)  LACTIC ACID, PLASMA  URINALYSIS, ROUTINE W REFLEX MICROSCOPIC  CBG MONITORING, ED    Imaging Review Dg Chest Port 1 View  07/10/2014   CLINICAL DATA:  Weakness.  Urinary incontinence  EXAM: PORTABLE CHEST - 1 VIEW  COMPARISON:  10/06/2013  FINDINGS: Hyperinflation and possible emphysematous change. There are bilateral nipple shadows which are symmetric. There is no edema, consolidation, effusion, or pneumothorax. Normal heart size and aortic contours.  IMPRESSION: COPD without acute superimposed disease.   Electronically Signed   By: Monte Fantasia M.D.   On: 07/10/2014 14:02     EKG Interpretation   Date/Time:  Monday July 10 2014 12:52:55 EDT Ventricular Rate:  110 PR Interval:  84 QRS Duration: 130 QT Interval:  360 QTC Calculation: 487 R Axis:   -99 Text Interpretation:  Sinus tachycardia Right atrial enlargement IVCD,  consider atypical RBBB Consider inferior infarct Nonspecific T  abnormalities, lateral leads Borderline ST elevation, lateral leads  Artifact in lead(s) I II III aVR aVL aVF V1 V2 V3 Confirmed by HORTON  MD,  Loma Sousa ([id number]) on 07/10/2014 2:39:07 PM      MDM   Final diagnoses:  Sepsis, due to unspecified organism  UTI (lower urinary tract infection)  Dehydration  Hypoglycemia  Hypercalcemia  Hyperkalemia  Acute on chronic kidney failure    Patient presents with generalized weakness worsening over the last several weeks. Frank pus draining from suprapubic catheter site. Noted to be hypotensive 91/61 and tachycardic on admission. Afebrile. However, given suprapubic catheter site, sepsis workup initiated. Patient had similar presentation in July 2015 and was noted to be severely dehydrated. Patient given 2 L of fluid. Found to have a blood glucose of 30. Patient given D50 and  started on D5 NS.  Suprapubic catheter was replaced by myself the bedside without difficulty. Minimal urine output.  Lab work notable for creatinine of 5.88, potassium 5.9 with QRS widening on the EKG.  Hypercalcemia with calcium of 13.5. This likely reflects severe dehydration. Calcium cardioprotective.  Lactate 2.2. Suspect urosepsis and severe dehydration. Patient given cefepime. Discussed with admitting hospitalist Dr. Darrick Meigs. Patient to be admitted to stepdown bed.  On recheck, prior to admission, still unable to obtain urine. This may be secondary to dehydration and acute kidney injury. Antibiotics were given.  Discussed with Dr. Posey Pronto, nephrology.  Hold Calcium gluconate.  Hydration.  No need to transfer to [facility] at this time.  Merryl Hacker, MD 07/10/14 [phone]

## 2014-07-10 NOTE — Significant Event (Signed)
Hypoglycemic Event  CBG: 21  Treatment: juice, dextrose amp   Symptoms: asymptomatic. Patient alert/oriented X 4.   Follow-up CBG: 101   Possible Reasons for Event: Renal insufficiency, NPO   Comments/MD notified: Dr. Posey Pronto made aware in person-gave verbal order for CBG Q2H and orders in place for bicarbonate with dextrose. Also made Dr. Darrick Meigs aware of hypoglycemic episodes.     Barbi Kumagai  Remember to initiate Hypoglycemia Order Set & complete

## 2014-07-10 NOTE — Consult Note (Addendum)
Reason for Consult: Acute renal failure on CKD stage IV Referring Physician: Eleonore Chiquito M.D. 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 He unfortunately, did not get his suprapubic catheter changed 2 weeks ago as routinely scheduled.  In the emergency room, noted to be hypotensive and given a bolus of intravenous fluids with good response. Also noted to have purulent drainage from his catheter (that was draining suboptimally) prompting exchange of this catheter. Was found to be in renal failure with a creatinine of 5.9/BUN 100 and hyperkalemic with a potassium of 5.9 and metabolic acidosis bicarbonate 15.  Denies any recent iodinated intravenous contrast and does not take NSAIDs. Denies any flank pain, fever, chills or hematuria. Denies any persistent nausea, vomiting or diarrhea.   10/08/2013  10/09/2013  10/10/2013  07/10/2014   BUN 78 (H) 57 (H) 37 (H) 100 (H)  Creatinine 4.16 (H) 3.19 (H) 2.54 (H) 5.88 (H)    Past Medical History  Diagnosis Date  . COPD (chronic obstructive pulmonary disease)     Past Surgical History  Procedure Laterality Date  . Abdominal surgery      History reviewed. No pertinent family history.  Social History:  reports that he has been smoking.  He does not have any smokeless tobacco history on file. He reports that he drinks alcohol. His drug history is not on file.  Allergies: No Known Allergies  Medications:  Scheduled: . [START ON 07/11/2014] ceFEPime (MAXIPIME) IV  500 mg Intravenous Q24H  . heparin  5,000 Units Subcutaneous 3 times per day  . mirtazapine  7.5 mg Oral QHS  . sodium bicarbonate  650 mg Oral BID  . sodium polystyrene  30 g Oral Once     Results for orders placed or performed during the hospital encounter of 07/10/14 (from the past 48 hour(s))  CBC with Differential     Status: Abnormal   Collection Time: 07/10/14 12:57 PM  Result Value Ref Range   WBC 12.6 (H) 4.0 - 10.5 K/uL   RBC 4.64 4.22 - 5.81 MIL/uL   Hemoglobin 10.2 (L) 13.0 - 17.0 g/dL   HCT 33.6 (L) 39.0 - 52.0 %   MCV 72.4 (L) 78.0 - 100.0 fL   MCH 22.0 (L) 26.0 - 34.0 pg   MCHC 30.4 30.0 - 36.0 g/dL   RDW 15.8 (H) 11.5 - 15.5 %   Platelets 461 (H) 150 - 400 K/uL   Neutrophils Relative % 85 (H) 43 - 77 %   Lymphocytes Relative 6 (L) 12 - 46 %   Monocytes Relative 9 3 - 12 %   Eosinophils Relative 0 0 - 5 %   Basophils Relative 0 0 - 1 %   Neutro Abs 10.7 (H) 1.7 - 7.7 K/uL   Lymphs Abs 0.8 0.7 - 4.0 K/uL   Monocytes Absolute 1.1 (H) 0.1 - 1.0 K/uL   Eosinophils Absolute 0.0 0.0 - 0.7 K/uL   Basophils Absolute 0.0 0.0 - 0.1 K/uL   RBC Morphology TARGET CELLS    WBC Morphology VACUOLATED NEUTROPHILS    Smear Review LARGE PLATELETS PRESENT   Comprehensive metabolic panel     Status: Abnormal   Collection Time: 07/10/14 12:57 PM  Result Value Ref Range   Sodium  138 135 - 145 mmol/L   Potassium 5.9 (H) 3.5 - 5.1 mmol/L    Comment: NO VISIBLE HEMOLYSIS   Chloride 106 96 - 112 mmol/L   CO2 15 (L) 19 - 32 mmol/L   Glucose, Bld 52 (L) 70 - 99 mg/dL   BUN 100 (H) 6 - 23 mg/dL    Comment: RESULTS CONFIRMED BY MANUAL DILUTION   Creatinine, Ser 5.88 (H) 0.50 - 1.35 mg/dL   Calcium 13.5 (HH) 8.4 - 10.5 mg/dL    Comment: RESULT REPEATED AND VERIFIED CRITICAL RESULT CALLED TO, READ BACK BY AND VERIFIED WITH: Margarita Rana RN @ 1404 ON 07/10/14 BY C DAVIS    Total Protein 8.9 (H) 6.0 - 8.3 g/dL   Albumin 2.8 (L) 3.5 - 5.2 g/dL   AST 18 0 - 37 U/L   ALT 13 0 - 53 U/L   Alkaline Phosphatase 69 39 - 117 U/L   Total Bilirubin 0.7 0.3 - 1.2 mg/dL   GFR calc non Af Amer 8 (L) >90 mL/min   GFR calc Af Amer 10 (L) >90 mL/min    Comment: (NOTE) The eGFR has been  calculated using the CKD EPI equation. This calculation has not been validated in all clinical situations. eGFR's persistently <90 mL/min signify possible Chronic Kidney Disease.    Anion gap 17 (H) 5 - 15  Lactic acid, plasma     Status: Abnormal   Collection Time: 07/10/14 12:57 PM  Result Value Ref Range   Lactic Acid, Venous 2.2 (HH) 0.5 - 2.0 mmol/L    Comment: RESULT REPEATED AND VERIFIED CRITICAL RESULT CALLED TO, READ BACK BY AND VERIFIED WITH: L JOHNSON @ 1771 ON 07/10/14 BY C DAVIS   CBG monitoring, ED     Status: Abnormal   Collection Time: 07/10/14  1:02 PM  Result Value Ref Range   Glucose-Capillary 32 (LL) 70 - 99 mg/dL   Comment 1 Repeat Test   CBG monitoring, ED     Status: Abnormal   Collection Time: 07/10/14  1:04 PM  Result Value Ref Range   Glucose-Capillary 30 (LL) 70 - 99 mg/dL   Comment 1 Notify RN    Comment 2 Document in Chart   CBG monitoring, ED     Status: Abnormal   Collection Time: 07/10/14  1:26 PM  Result Value Ref Range   Glucose-Capillary 165 (H) 70 - 99 mg/dL   Comment 1 Notify RN    Comment 2 Document in Chart   CBG monitoring, ED     Status: None   Collection Time: 07/10/14  2:10 PM  Result Value Ref Range   Glucose-Capillary 83 70 - 99 mg/dL   Comment 1 Notify RN    Comment 2 Document in Chart     Dg Chest Port 1 View  07/10/2014   CLINICAL DATA:  Weakness.  Urinary incontinence  EXAM: PORTABLE CHEST - 1 VIEW  COMPARISON:  10/06/2013  FINDINGS: Hyperinflation and possible emphysematous change. There are bilateral nipple shadows which are symmetric. There is no edema, consolidation, effusion, or pneumothorax. Normal heart size and aortic contours.  IMPRESSION: COPD without acute superimposed disease.   Electronically Signed   By: Monte Fantasia M.D.   On: 07/10/2014 14:02    Review of Systems  Constitutional: Positive for malaise/fatigue. Negative for fever and chills.  HENT: Negative.   Eyes: Negative.   Respiratory: Negative.    Cardiovascular: Negative.   Gastrointestinal: Negative.   Genitourinary:  See history of present illness  Musculoskeletal: Positive for myalgias.       Leg pain-intermittent  Skin:       Dry/flaky skin  Neurological: Positive for weakness. Negative for dizziness and tingling.  Endo/Heme/Allergies: Negative for environmental allergies. Does not bruise/bleed easily.  Psychiatric/Behavioral: The patient is nervous/anxious.    Blood pressure 104/60, pulse 94, temperature 97.4 F (36.3 C), temperature source Rectal, resp. rate 16, weight 52.164 kg (115 lb), SpO2 100 %. Physical Exam  Nursing note and vitals reviewed. Constitutional: He is oriented to person, place, and time. He appears well-developed.  Appears cachectic and malnourished  HENT:  Head: Normocephalic and atraumatic.  Nose: Nose normal.  Mouth/Throat: No oropharyngeal exudate.  Eyes: Conjunctivae and EOM are normal. Pupils are equal, round, and reactive to light. No scleral icterus.  Neck: Normal range of motion. Neck supple. No JVD present.  Cardiovascular: Normal rate, regular rhythm and normal heart sounds.   No murmur heard. Respiratory: Effort normal and breath sounds normal. No respiratory distress. He has no wheezes.  GI: Soft. Bowel sounds are normal. There is no tenderness.  Musculoskeletal: He exhibits no edema.  Dry flaky/fragile skin over legs  Neurological: He is alert and oriented to person, place, and time. No cranial nerve deficit.  Skin: Skin is warm and dry. No erythema.    Assessment/Plan: 1. Acute renal failure on chronic kidney disease stage IV: Underlying chronic kidney disease appears to be likely from recurrent urinary tract infection/obstructive uropathy. His current acute renal failure episode appears to be from a combination of hypotension (likely sepsis from urinary tract infection) and obstruction (purulent urine obstructing suprapubic catheter drainage). Agree with supportive management  at this time with intravenous fluids-we'll switch to isotonic sodium bicarbonate to help treat metabolic acidosis, hyperkalemia and intravascular volume depletion using the same fluid. 2. Hyperkalemia: From acute renal failure and likely with an element of distal RTA from recurrent urinary tract obstruction. Status post medical management, continue to monitor-no acute dialysis needs. 3. Anion gap metabolic acidosis: Secondary to acute renal failure plus/minus RTA, will give isotonic sodium bicarbonate at this time. 4. Hypercalcemia: I will check a PTH level and 25-hydroxy vitamin D level to workup this hypercalcemia. He does not take oral calcium supplements as an outpatient. This is likely to be hypercalcemia from acute renal failure but need to rule out hypercalcemia of malignancy 5. Urinary tract infection with sepsis: Status post exchange of his Foley catheter, urine sent for culture. Started on empiric cefepime.  Simran Mannis K. 07/10/2014, 4:04 PM

## 2014-07-10 NOTE — ED Notes (Signed)
Multiple attempts made to draw blood cultures unsuccessful by NT and RN

## 2014-07-10 NOTE — ED Notes (Signed)
Multiple attempts made to draw blood for blood cultures. MD aware, unable to start antibiotic until blood cultures drawn. Will retry after 1st bolus of fluid finished infusing.

## 2014-07-10 NOTE — ED Notes (Signed)
Alerted main lab to need for blood draw for cultures

## 2014-07-10 NOTE — Progress Notes (Signed)
UR completed 

## 2014-07-10 NOTE — ED Notes (Addendum)
2.2 critical lactic acid called from lab. Dr. Dina Rich made aware.

## 2014-07-10 NOTE — Progress Notes (Signed)
ANTIBIOTIC CONSULT NOTE - INITIAL  Pharmacy Consult for Cefepime Indication: urosepsis  No Known Allergies  Patient Measurements:    Vital Signs: Temp: 97.4 F (36.3 C) (04/25 1259) Temp Source: Rectal (04/25 1259) BP: 91/61 mmHg (04/25 1257) Pulse Rate: 112 (04/25 1259) Intake/Output from previous day:   Intake/Output from this shift:    Labs: No results for input(s): WBC, HGB, PLT, LABCREA, CREATININE in the last 72 hours. CrCl cannot be calculated (Unknown ideal weight.). No results for input(s): VANCOTROUGH, VANCOPEAK, VANCORANDOM, GENTTROUGH, GENTPEAK, GENTRANDOM, TOBRATROUGH, TOBRAPEAK, TOBRARND, AMIKACINPEAK, AMIKACINTROU, AMIKACIN in the last 72 hours.   Microbiology: No results found for this or any previous visit (from the past 720 hour(s)).  Medical History: Past Medical History  Diagnosis Date  . COPD (chronic obstructive pulmonary disease)     Medications:  Scheduled:   Infusions:  . ceFEPime (MAXIPIME) IV    . dextrose 5 % and 0.9% NaCl    . sodium chloride 1,000 mL (07/10/14 1312)   PRN:  Anti-infectives    Start     Dose/Rate Route Frequency Ordered Stop   07/10/14 1315  ceFEPIme (MAXIPIME) 2 g in dextrose 5 % 50 mL IVPB     2 g 100 mL/hr over 30 Minutes Intravenous  Once 07/10/14 1300       Assessment 73yoM with indwelling catheter and Hx UTI presents with increasing weakness x 2 wks and diminished appetite.  Patient brought to ED with hypotension and hypoglycemia with CBG 30.  To begin cefepime per pharmacy for urosepsis; 2g x 1 ordered in ED.  4/25 >> cefepime >>  Temp: afebrile WBC: 12.6 Renal: SCr 5.88; CrCl 8 ml/min CG; per Epic history SCr ran 2.5 - 5 over past year  4/25 blood: IP 4/25 urine: IP   Goal of Therapy:   Eradication of infection  Appropriate antibiotic dosing for indication and renal function  Plan:  Day 1 of antibiotics  Cefepime 500 mg IV q24 hr, to start at 14:00 tomorrow  Follow clinical course, renal  function, culture results as available  Follow for de-escalation of antibiotics and LOT   Reuel Boom, PharmD Pager: [phone] 07/10/2014, 1:16 PM

## 2014-07-10 NOTE — ED Notes (Signed)
Bed: WA16 Expected date:  Expected time:  Means of arrival:  Comments: EMS- UTI? 

## 2014-07-11 LAB — IRON AND TIBC
Iron: 16 ug/dL — ABNORMAL LOW (ref 42–165)
Saturation Ratios: 15 % — ABNORMAL LOW (ref 20–55)
TIBC: 108 ug/dL — ABNORMAL LOW (ref 215–435)
UIBC: 92 ug/dL — ABNORMAL LOW (ref 125–400)

## 2014-07-11 LAB — GLUCOSE, CAPILLARY
GLUCOSE-CAPILLARY: 21 mg/dL — AB (ref 70–99)
GLUCOSE-CAPILLARY: 187 mg/dL — AB (ref 70–99)
GLUCOSE-CAPILLARY: 60 mg/dL — AB (ref 70–99)
GLUCOSE-CAPILLARY: 104 mg/dL — AB (ref 70–99)
GLUCOSE-CAPILLARY: 113 mg/dL — AB (ref 70–99)
Glucose-Capillary: 113 mg/dL — ABNORMAL HIGH (ref 70–99)
Glucose-Capillary: 146 mg/dL — ABNORMAL HIGH (ref 70–99)
Glucose-Capillary: 59 mg/dL — ABNORMAL LOW (ref 70–99)
Glucose-Capillary: 72 mg/dL (ref 70–99)
Glucose-Capillary: 79 mg/dL (ref 70–99)
Glucose-Capillary: 95 mg/dL (ref 70–99)
Glucose-Capillary: 98 mg/dL (ref 70–99)

## 2014-07-11 LAB — CBC
HEMATOCRIT: 26.5 % — AB (ref 39.0–52.0)
Hemoglobin: 8 g/dL — ABNORMAL LOW (ref 13.0–17.0)
MCH: 21.6 pg — ABNORMAL LOW (ref 26.0–34.0)
MCHC: 30.2 g/dL (ref 30.0–36.0)
MCV: 71.4 fL — ABNORMAL LOW (ref 78.0–100.0)
Platelets: 362 10*3/uL (ref 150–400)
RBC: 3.71 MIL/uL — ABNORMAL LOW (ref 4.22–5.81)
RDW: 15.6 % — ABNORMAL HIGH (ref 11.5–15.5)
WBC: 9 10*3/uL (ref 4.0–10.5)

## 2014-07-11 LAB — COMPREHENSIVE METABOLIC PANEL
ALK PHOS: 53 U/L (ref 39–117)
ALT: 16 U/L (ref 0–53)
ANION GAP: 8 (ref 5–15)
AST: 33 U/L (ref 0–37)
Albumin: 2 g/dL — ABNORMAL LOW (ref 3.5–5.2)
BUN: 84 mg/dL — ABNORMAL HIGH (ref 6–23)
CO2: 22 mmol/L (ref 19–32)
CREATININE: 4.95 mg/dL — AB (ref 0.50–1.35)
Calcium: 10.9 mg/dL — ABNORMAL HIGH (ref 8.4–10.5)
Chloride: 109 mmol/L (ref 96–112)
GFR calc non Af Amer: 10 mL/min — ABNORMAL LOW (ref >90)
GFR, EST AFRICAN AMERICAN: 12 mL/min — AB (ref >90)
Glucose, Bld: 85 mg/dL (ref 70–99)
Potassium: 4.9 mmol/L (ref 3.5–5.1)
SODIUM: 139 mmol/L (ref 135–145)
Total Bilirubin: 0.4 mg/dL (ref 0.3–1.2)
Total Protein: 6.5 g/dL (ref 6.0–8.3)

## 2014-07-11 LAB — FERRITIN: FERRITIN: 1078 ng/mL — AB (ref 22–322)

## 2014-07-11 MED ORDER — GABAPENTIN 300 MG PO CAPS
300.0000 mg | ORAL_CAPSULE | Freq: Three times a day (TID) | ORAL | Status: DC
  Administered 2014-07-11 – 2014-07-13 (×9): 300 mg via ORAL
  Filled 2014-07-11 (×12): qty 1

## 2014-07-11 MED ORDER — GABAPENTIN 600 MG PO TABS: 300.0000 mg | ORAL_TABLET | Freq: Three times a day (TID) | ORAL | Status: DC

## 2014-07-11 NOTE — Significant Event (Signed)
Rectal temp of 95.9, patient has been given warm blankets and room temperature increased. Bair-hugger applied on patient. Dr. Darrick Meigs made aware.

## 2014-07-11 NOTE — Significant Event (Addendum)
RN flushed catheter as there were no measurable output from suprapubic catheter. Nightshift RN report drainage around suprapubic catheter all night-none draining via foley tube.   After flushing with 20cc NS-received back pus, purulent, with small blood clots. Will let MD aware when he rounds this morning.

## 2014-07-11 NOTE — Progress Notes (Signed)
[facility] PROGRESS NOTE  [name] PPJ:[id number] DOB: 1941-03-15 DOA: 07/10/2014 PCP: Dorcas Mcmurray, MD  Assessment/Plan:  Sepsis due to UTI  Patient presented with UTI, had pus draining from the suprapubic catheter. UA and culture obtained. Patient started on cefepime. Suprapubic catheter was changed in the ED. Patient is improving we'll follow the culture results.  Acute kidney injury on CKD stage IV  Patient presented with acute kidney injury on CK D. At the time of admission BUN/creatinine was 100/5.88, patient was seen by nephrology started on IV fluids, and this morning BUN/creatinine 84/4.95.  ? 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 We'll start the patient on gabapentin 300 mg by mouth 3 times a day  Metabolic acidosis Patient presented with metabolic acidosis likely from RTA/acute kidney injury Patient started on bicarbonate infusion This morning CO2 is 77 Nephrology following  Hypercalcemia Patient presented  with calcium 13.5, with albumin of 2.8, the corrected calcium is 14.5. Patient was started on IV fluids. And this morning calcium is 10.9.   Hyperkalemia Patient came with potassium of 5.9, was given 1 dose of Kayexalate 30 g by mouth 1. This morning the potassium is 4.9  DVT prophylaxis Heparin  Code Status: Full code Family Communication: No family at bedside Disposition Plan:  To be decided, likely home when medically stable in 3-4 days   Consultants:  Nephrology  Procedures:   suprapubic catheter replaced in the ED  Antibiotics:  Cefepime 07/10/14  HPI/Subjective: 74 year old male with a history of COPD, chronic indwelling suprapubic catheter managed by urology Dr Janice Norrie, who came to the hospital with chief complaint of weakness, leg pain, poor by mouth intake. As per patient he did not get the suprapubic catheter changed so he was 2 weeks behind the schedule. He forgot  to make an appointment. Patient denies any fever at home, no nausea vomiting or diarrhea. No chest pain or shortness of breath. In the ED patient was found to be hypotensive, received IV fluids. Suprapubic catheter was found to be draining pus, the catheter was changed. Patient also was found to be hypoglycemic with blood sugar in 30s and was given 1 ampule of D50  Pat ient also found to be in acute kidney injury. Patient does have a history of underlying C KD stage IV with baseline creatinine around 2.5. Patient also found to have hyperkalemia as well as hypercalcemia with calcium 13.5  This morning patient feels better, renal function has improved calcium is down. Still complains of pain in both the legs.  Objective: Filed Vitals:   07/11/14 1000  BP:   Pulse: 91  Temp:   Resp: 27    Intake/Output Summary (Last 24 hours) at 07/11/14 1058 Last data filed at 07/11/14 1000  Gross per 24 hour  Intake 2117.92 ml  Output    205 ml  Net 1912.92 ml   Filed Weights   07/10/14 1331  Weight: 52.164 kg (115 lb)    Exam:   General:  Appears in no acute distress  Cardiovascular:  S1-S2 is regular  Respiratory:  clear to auscultation bilaterally  Abdomen:  soft, nontender, no organomegaly, suprapubic catheter in place  Musculoskeletal:  no edema noted in the lower extremities   Data Reviewed: Basic Metabolic Panel:  Recent Labs Lab 07/10/14 1257 07/10/14 1425 07/11/14 0400  NA 138  --  139  K 5.9*  --  4.9  CL 106  --  109  CO2 15*  --  22  GLUCOSE 52*  --  85  BUN 100*  --  84*  CREATININE 5.88*  --  4.95*  CALCIUM 13.5*  --  10.9*  PHOS  --  3.8  --    Liver Function Tests:  Recent Labs Lab 07/10/14 1257 07/11/14 0400  AST 18 33  ALT 13 16  ALKPHOS 69 53  BILITOT 0.7 0.4  PROT 8.9* 6.5  ALBUMIN 2.8* 2.0*   No results for input(s): LIPASE, AMYLASE in the last 168 hours. No results for input(s): AMMONIA in the last 168 hours. CBC:  Recent Labs Lab  07/10/14 1257 07/11/14 0400  WBC 12.6* 9.0  NEUTROABS 10.7*  --   HGB 10.2* 8.0*  HCT 33.6* 26.5*  MCV 72.4* 71.4*  PLT 461* 362   Cardiac Enzymes: No results for input(s): CKTOTAL, CKMB, CKMBINDEX, TROPONINI in the last 168 hours. BNP (last 3 results) No results for input(s): BNP in the last 8760 hours.  ProBNP (last 3 results)  Recent Labs  10/06/13 2143  PROBNP 1216.0*    CBG:  Recent Labs Lab 07/10/14 2352 07/11/14 0413 07/11/14 5732 07/11/14 0735 07/11/14 1003  GLUCAP 79 72 104* 95 98    Recent Results (from the past 240 hour(s))  Blood culture (routine x 2)     Status: None (Preliminary result)   Collection Time: 07/10/14 12:58 PM  Result Value Ref Range Status   Specimen Description BLOOD LEFT ARM  Final   Special Requests BOTTLES DRAWN AEROBIC ONLY 10CC  Final   Culture   Final           BLOOD CULTURE RECEIVED NO GROWTH TO DATE CULTURE WILL BE HELD FOR 5 DAYS BEFORE ISSUING A FINAL NEGATIVE REPORT Performed at [facility]    Report Status PENDING  Incomplete  Blood culture (routine x 2)     Status: None (Preliminary result)   Collection Time: 07/10/14  2:59 PM  Result Value Ref Range Status   Specimen Description BLOOD LEFT WRIST  Final   Special Requests BOTTLES DRAWN AEROBIC ONLY 3CC  Final   Culture   Final           BLOOD CULTURE RECEIVED NO GROWTH TO DATE CULTURE WILL BE HELD FOR 5 DAYS BEFORE ISSUING A FINAL NEGATIVE REPORT Performed at [facility]    Report Status PENDING  Incomplete  MRSA PCR Screening     Status: None   Collection Time: 07/10/14  4:10 PM  Result Value Ref Range Status   MRSA by PCR NEGATIVE NEGATIVE Final    Comment:        The GeneXpert MRSA Assay (FDA approved for NASAL specimens only), is one component of a comprehensive MRSA colonization surveillance program. It is not intended to diagnose MRSA infection nor to guide or monitor treatment for MRSA infections.      Studies: Dg Chest Port 1  View  07/10/2014   CLINICAL DATA:  Weakness.  Urinary incontinence  EXAM: PORTABLE CHEST - 1 VIEW  COMPARISON:  10/06/2013  FINDINGS: Hyperinflation and possible emphysematous change. There are bilateral nipple shadows which are symmetric. There is no edema, consolidation, effusion, or pneumothorax. Normal heart size and aortic contours.  IMPRESSION: COPD without acute superimposed disease.   Electronically Signed   By: Monte Fantasia M.D.   On: 07/10/2014 14:02    Scheduled Meds: . ceFEPime (MAXIPIME) IV  500 mg Intravenous Q24H  . gabapentin  300 mg Oral TID  . heparin  5,000  Units Subcutaneous 3 times per day   Continuous Infusions: .  sodium bicarbonate  infusion 1000 mL 125 mL/hr at 07/11/14 1016    Active Problems:   Renal failure   Metabolic acidosis   UTI (lower urinary tract infection)   Sepsis    Time spent: *25 min     [facility]  [facility] Pager [phone]*. If 7PM-7AM, please contact night-coverage at [email or web address], password [facility] 07/11/2014, 10:58 AM  LOS: 1 day

## 2014-07-11 NOTE — Progress Notes (Signed)
Patient ID: [name], male   DOB: 11-28-1940, 74 y.o.   MRN: [id number]  [facility] Progress Note    Assessment/ Plan:   1. Acute renal failure on chronic kidney disease stage IV: Underlying chronic kidney disease appears to be likely from recurrent urinary tract infection/obstructive uropathy. His acute renal failure appears to be in from a combination of obstruction with hypotension (severe UTI with sepsis) and appears to be improving slowly with marginal urine output following intravenous fluid therapy. It appears that he needed flushing of his Foley catheter overnight. Continue to monitor closely-no changes to therapy at this time. We'll start renal diet. 2. Hyperkalemia: From acute renal failure and likely with an element of distal RTA from recurrent urinary tract obstruction. Improved with medical management, continue to monitor trend. 3. Anion gap metabolic acidosis: Secondary to acute renal failure plus/minus RTA, improving well with isotonic sodium bicarbonate-continue to monitor. 4. Hypercalcemia: Suspected to be primarily from acute renal failure-PTH/25-hydroxy vitamin D levels checked this morning to rule out other possible etiologies such as malignancy 5. Urinary tract infection with sepsis: Status post exchange of his Foley catheter and flushes overnight, urine culture still pending. Appears to be doing well on empiric cefepime-further adjustment based on culture data. 6. Anemia: Check iron studies (replace only after acute urinary tract infection controlled) and plan to start ESA.  Subjective:   Reports to be feeling better-denies any chest pain or shortness of breath    Objective:   BP 108/57 mmHg  Pulse 95  Temp(Src) 97.6 F (36.4 C) (Oral)  Resp 21  Ht '6\' 5"'$  (1.956 m)  Wt 52.164 kg (115 lb)  BMI 13.63 kg/m2  SpO2 100%  Intake/Output Summary (Last 24 hours) at 07/11/14 1248 Last data filed at 07/11/14 1100  Gross per 24 hour  Intake 2242.92 ml   Output    330 ml  Net 1912.92 ml   Weight change:   Physical Exam: Gen: Comfortably resting in a recliner-sleepy and difficult to arouse CVS: Pulse regular in rate and rhythm Resp: Clear to auscultation, no rales Abd: Soft, scaphoid, nontender Ext: No lower extremity edema  Imaging: Dg Chest Port 1 View  07/10/2014   CLINICAL DATA:  Weakness.  Urinary incontinence  EXAM: PORTABLE CHEST - 1 VIEW  COMPARISON:  10/06/2013  FINDINGS: Hyperinflation and possible emphysematous change. There are bilateral nipple shadows which are symmetric. There is no edema, consolidation, effusion, or pneumothorax. Normal heart size and aortic contours.  IMPRESSION: COPD without acute superimposed disease.   Electronically Signed   By: Monte Fantasia M.D.   On: 07/10/2014 14:02    Labs: BMET  Recent Labs Lab 07/10/14 1257 07/10/14 1425 07/11/14 0400  NA 138  --  139  K 5.9*  --  4.9  CL 106  --  109  CO2 15*  --  22  GLUCOSE 52*  --  85  BUN 100*  --  84*  CREATININE 5.88*  --  4.95*  CALCIUM 13.5*  --  10.9*  PHOS  --  3.8  --    CBC  Recent Labs Lab 07/10/14 1257 07/11/14 0400  WBC 12.6* 9.0  NEUTROABS 10.7*  --   HGB 10.2* 8.0*  HCT 33.6* 26.5*  MCV 72.4* 71.4*  PLT 461* 362    Medications:    . ceFEPime (MAXIPIME) IV  500 mg Intravenous Q24H  . gabapentin  300 mg Oral TID  . heparin  5,000 Units Subcutaneous 3 times per day  Elmarie Shiley, MD 07/11/2014, 12:48 PM

## 2014-07-11 NOTE — Significant Event (Signed)
Patient admitted from ED, arrived via stretcher. Transitioned into bed. Patient arrived with two PIV on right arm, with NS @ 125cc/hour and NS bolus infusing. Patient is alert and oriented. Dry/scaly skin on bilateral extremities; cracked skin on bilateral feet.   Patient arrived to unit with a black wallet. Patient did not want RN to lock this up in security; stated he needed to keep it with him so the landlord can get his rent money. Patient made aware that staff not responsible for missing or lost items. Patient verbalized understanding. No family at bedside.

## 2014-07-11 NOTE — Plan of Care (Signed)
Problem: Phase I Progression Outcomes Goal: Voiding-avoid urinary catheter unless indicated Outcome: Not Applicable Date Met:  79/39/68 Patient has chronic suprapubic catheter.

## 2014-07-12 ENCOUNTER — Inpatient Hospital Stay (HOSPITAL_COMMUNITY): Payer: Medicare Other

## 2014-07-12 DIAGNOSIS — M25551 Pain in right hip: Secondary | ICD-10-CM

## 2014-07-12 LAB — RENAL FUNCTION PANEL
Albumin: 1.8 g/dL — ABNORMAL LOW (ref 3.5–5.2)
Anion gap: 10 (ref 5–15)
BUN: 73 mg/dL — ABNORMAL HIGH (ref 6–23)
CALCIUM: 9.9 mg/dL (ref 8.4–10.5)
CO2: 35 mmol/L — AB (ref 19–32)
Chloride: 96 mmol/L (ref 96–112)
Creatinine, Ser: 4.62 mg/dL — ABNORMAL HIGH (ref 0.50–1.35)
GFR calc Af Amer: 13 mL/min — ABNORMAL LOW (ref >90)
GFR calc non Af Amer: 11 mL/min — ABNORMAL LOW (ref >90)
GLUCOSE: 120 mg/dL — AB (ref 70–99)
POTASSIUM: 2.9 mmol/L — AB (ref 3.5–5.1)
Phosphorus: 2.7 mg/dL (ref 2.3–4.6)
SODIUM: 141 mmol/L (ref 135–145)

## 2014-07-12 LAB — VITAMIN D 25 HYDROXY (VIT D DEFICIENCY, FRACTURES): Vit D, 25-Hydroxy: 15.1 ng/mL — ABNORMAL LOW (ref 30.0–100.0)

## 2014-07-12 LAB — CLOSTRIDIUM DIFFICILE BY PCR: Toxigenic C. Difficile by PCR: NEGATIVE

## 2014-07-12 LAB — PARATHYROID HORMONE, INTACT (NO CA): PTH: 7 pg/mL — AB (ref 15–65)

## 2014-07-12 MED ORDER — POTASSIUM CHLORIDE CRYS ER 20 MEQ PO TBCR
40.0000 meq | EXTENDED_RELEASE_TABLET | Freq: Once | ORAL | Status: AC
  Administered 2014-07-12: 40 meq via ORAL
  Filled 2014-07-12: qty 2

## 2014-07-12 MED ORDER — DARBEPOETIN ALFA 100 MCG/0.5ML IJ SOSY
100.0000 ug | PREFILLED_SYRINGE | INTRAMUSCULAR | Status: DC
  Administered 2014-07-12 – 2014-07-19 (×2): 100 ug via SUBCUTANEOUS
  Filled 2014-07-12 (×4): qty 0.5

## 2014-07-12 MED ORDER — SODIUM CHLORIDE 0.9 % IV SOLN
INTRAVENOUS | Status: AC
  Administered 2014-07-12: 09:00:00 via INTRAVENOUS

## 2014-07-12 MED ORDER — NEPRO/CARBSTEADY PO LIQD
237.0000 mL | Freq: Three times a day (TID) | ORAL | Status: DC
  Administered 2014-07-12 – 2014-07-20 (×14): 237 mL via ORAL
  Filled 2014-07-12 (×26): qty 237

## 2014-07-12 NOTE — Progress Notes (Signed)
Patient ID: [name], male   DOB: [birth year], 73 y.o.   MRN: [id number]  [facility] Progress Note    Assessment/ Plan:   1. Acute renal failure on chronic kidney disease stage IV: Acute renal failure from obstruction- catheter plugging due to purulent urine/UTI with sepsis. Nonoliguric with slow renal improvement. Agree with switching from isotonic bicarbonate to normal saline given worsening metabolic alkalosis  2. Hypokalemia: Likely secondary to transient cellular shifts from sodium bicarbonate therapy-ongoing repletion. Discontinued sodium bicarbonate. 3. Anion gap metabolic acidosis: Corrected, now alkalotic on sodium bicarbonate-this has been discontinued. 4. Hypercalcemia: Improved with renal recovery/urine output, suspected to be primarily from acute renal failure-parathyroid hormone levels appropriately suppressed for hypercalcemia. Continue to monitor. 5. Escherichia coli Urinary tract infection with sepsis: Status post exchange of his Foley catheter and flushes overnight, may be able to narrow antibiotic to ceftriaxone based on sensitivity. 6. Anemia: Start ESA, Low iron saturation but elevated ferritin not permissive to intravenous iron therapy-this is likely a reflection of his ongoing infection/inflammation.  Subjective:   No acute events overnight , appetite improving and requesting nutritional supplements. Complains of intermittent right hip pain/thigh pain.   Objective:   BP 120/56 mmHg  Pulse 107  Temp(Src) 97.6 F (36.4 C) (Oral)  Resp 11  Ht '6\' 5"'$  (1.956 m)  Wt 55 kg (121 lb 4.1 oz)  BMI 14.38 kg/m2  SpO2 100%  Intake/Output Summary (Last 24 hours) at 07/12/14 1243 Last data filed at 07/12/14 1100  Gross per 24 hour  Intake 3277.5 ml  Output   1555 ml  Net 1722.5 ml   Weight change: 2.836 kg (6 lb 4.1 oz)  Physical Exam: [id number] comfortably in bed-trying to eat lunch  CVS: Regular tachycardia, S1 and S2 with ESM Resp: Coarse breath  sounds bilaterally, no rales Abd: Soft, flat, nontender Ext: No lower extremity edema  Imaging: Dg Chest Port 1 View  07/10/2014   CLINICAL DATA:  Weakness.  Urinary incontinence  EXAM: PORTABLE CHEST - 1 VIEW  COMPARISON:  10/06/2013  FINDINGS: Hyperinflation and possible emphysematous change. There are bilateral nipple shadows which are symmetric. There is no edema, consolidation, effusion, or pneumothorax. Normal heart size and aortic contours.  IMPRESSION: COPD without acute superimposed disease.   Electronically Signed   By: Monte Fantasia M.D.   On: 07/10/2014 14:02   Dg Hip Unilat With Pelvis 2-3 Views Right  07/12/2014   CLINICAL DATA:  Right lateral hip pain x2 weeks, no no known injury  EXAM: RIGHT HIP (WITH PELVIS) 2-3 VIEWS  COMPARISON:  None.  FINDINGS: No fracture or dislocation is seen.  Mild degenerative changes of the bilateral hips.  Visualized bony pelvis appears intact.  IMPRESSION: No fracture or dislocation is seen.  Mild degenerative changes of the bilateral hips.   Electronically Signed   By: Julian Hy M.D.   On: 07/12/2014 11:42    Labs: BMET  Recent Labs Lab 07/10/14 1257 07/10/14 1425 07/11/14 0400 07/12/14 0345  NA 138  --  139 141  K 5.9*  --  4.9 2.9*  CL 106  --  109 96  CO2 15*  --  22 35*  GLUCOSE 52*  --  85 120*  BUN 100*  --  84* 73*  CREATININE 5.88*  --  4.95* 4.62*  CALCIUM 13.5*  --  10.9* 9.9  PHOS  --  3.8  --  2.7   CBC  Recent Labs Lab 07/10/14 1257 07/11/14 0400  WBC 12.6*  9.0  NEUTROABS 10.7*  --   HGB 10.2* 8.0*  HCT 33.6* 26.5*  MCV 72.4* 71.4*  PLT 461* 362   Medications:    . ceFEPime (MAXIPIME) IV  500 mg Intravenous Q24H  . gabapentin  300 mg Oral TID  . heparin  5,000 Units Subcutaneous 3 times per day   Elmarie Shiley, MD 07/12/2014, 12:43 PM

## 2014-07-12 NOTE — Progress Notes (Signed)
[facility] PROGRESS NOTE  [name] OZD:[id number] DOB: 1940-09-01 DOA: 07/10/2014 PCP: Dorcas Mcmurray, MD  74 y/o ? 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 UA and culture obtained.  Patient started on cefepime.  Suprapubic catheter was changed in the ED.   Acute kidney injury on CKD stage IV  Patient presented with acute kidney injury on CK D.  At the time of admission BUN/creatinine was 100/5.88, Appreciate input from nephrology  Improving trend  ? 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 We'll start the patient on gabapentin 300 mg by mouth 3 times a day Obtain Hip Xrays DG R 2-3 vw 2/59  Metabolic acidosis Patient presented with metabolic acidosis likely from RTA/acute kidney injury Patient started on bicarbonate infusion This morning CO2 is 22-->35 ? d/c Bicarb Gtt 4/27 am and  to Saline 75 cc/hr Nephrology following  Hypercalcemia Patient presented  with calcium 13.5, with albumin of 2.8, the corrected calcium is 14.5.  currently imporved  Hyperkalemia Patient came with potassium of 5.9, was given 1 dose of Kayexalate 30 g by mouth 1. This morning the potassium is 2.9 Likely 2/2 to ion shifting from Bicarb D/c Bicarb gtt, Replace K  Anemia AoFe deficiency Consider Hemoccults and OP management    DVT prophylaxis Heparin  Code Status: Full code Family Communication: No family at bedside Disposition Plan:  Transfer to tele 4/27   Consultants:  Nephrology  Procedures:    suprapubic catheter replaced in the ED  Antibiotics:  Cefepime 07/10/14  HPI/Subjective:  Doing fair today.  Has been taking Hydrocodone pills for significant pain in the past --he claims he took this from a  neighbour who uses this for Neuropathy-pain has been going on !=~ 2 weeks in RLE-"i have no strenght" No radicualr pain, Pain worse in am ion moving-gets better c movement, no cp No sob No n No V  Lives in a  Boarding house for the past 8 yrs Pain in the RLE was + for the past 2 weeks  9 stools last pm!  Objective: Filed Vitals:   07/12/14 0700  BP: 99/44  Pulse: 82  Temp:   Resp: 16    Intake/Output Summary (Last 24 hours) at 07/12/14 0751 Last data filed at 07/12/14 0654  Gross per 24 hour  Intake 3477.5 ml  Output   1405 ml  Net 2072.5 ml   Filed Weights   07/10/14 1331 07/12/14 0500  Weight: 52.164 kg (115 lb) 55 kg (121 lb 4.1 oz)    Exam:   General:  Appears in no acute distress, frail and cachectic  Cardiovascular:  S1-S2 is regular  Respiratory:  clear to auscultation bilaterally  Abdomen:  soft, nontender, no organomegaly, suprapubic catheter in place  Musculoskeletal:  no edema noted in the lower extremities-excoriations to the LE's.  R hip is non tender  Data Reviewed: Basic Metabolic Panel:  Recent Labs Lab 07/10/14 1257 07/10/14 1425 07/11/14 0400 07/12/14 0345  NA 138  --  139 141  K 5.9*  --  4.9 2.9*  CL 106  --  109 96  CO2 15*  --  22 35*  GLUCOSE 52*  --  85 120*  BUN 100*  --  84* 73*  CREATININE 5.88*  --  4.95* 4.62*  CALCIUM 13.5*  --  10.9* 9.9  PHOS  --  3.8  --  2.7   Liver Function Tests:  Recent Labs Lab 07/10/14 1257 07/11/14 0400 07/12/14 0345  AST 18 33  --   ALT 13 16  --   ALKPHOS 69 53  --   BILITOT 0.7 0.4  --   PROT 8.9* 6.5  --   ALBUMIN 2.8* 2.0* 1.8*   No results for input(s): LIPASE, AMYLASE in the last 168 hours. No results for input(s): AMMONIA in the last 168 hours. CBC:  Recent  Labs Lab 07/10/14 1257 07/11/14 0400  WBC 12.6* 9.0  NEUTROABS 10.7*  --   HGB 10.2* 8.0*  HCT 33.6* 26.5*  MCV 72.4* 71.4*  PLT 461* 362   Cardiac Enzymes: No results for input(s): CKTOTAL, CKMB, CKMBINDEX, TROPONINI in the last 168 hours. BNP (last 3 results) No results for input(s): BNP in the last 8760 hours.  ProBNP (last 3 results)  Recent Labs  10/06/13 2143  PROBNP 1216.0*    CBG:  Recent Labs Lab 07/11/14 0638 07/11/14 0735 07/11/14 1003 07/11/14 1229 07/11/14 1704  GLUCAP 104* 95 98 113* 113*    Recent Results (from the past 240 hour(s))  Blood culture (routine x 2)     Status: None (Preliminary result)   Collection Time: 07/10/14 12:58 PM  Result Value Ref Range Status   Specimen Description BLOOD LEFT ARM  Final   Special Requests BOTTLES DRAWN AEROBIC ONLY 10CC  Final   Culture   Final           BLOOD CULTURE RECEIVED NO GROWTH TO DATE CULTURE WILL BE HELD FOR 5 DAYS BEFORE ISSUING A FINAL NEGATIVE REPORT Performed at [facility]    Report Status PENDING  Incomplete  Blood culture (routine x 2)     Status: None (Preliminary result)   Collection Time: 07/10/14  2:59 PM  Result Value Ref Range Status   Specimen Description BLOOD LEFT WRIST  Final   Special Requests BOTTLES DRAWN AEROBIC ONLY 3CC  Final   Culture   Final           BLOOD CULTURE RECEIVED NO GROWTH TO DATE CULTURE WILL BE HELD FOR 5 DAYS BEFORE ISSUING A FINAL NEGATIVE REPORT Performed at [facility]    Report Status PENDING  Incomplete  Urine culture     Status: None (Preliminary result)   Collection Time: 07/10/14  4:10 PM  Result Value Ref Range Status   Specimen Description URINE, RANDOM  Final   Special Requests NONE  Final   Colony Count   Final    >=100,000 COLONIES/ML Performed at [facility]    Culture   Final    ESCHERICHIA COLI Performed at [facility]    Report Status PENDING  Incomplete  MRSA PCR Screening     Status: None     Collection Time: 07/10/14  4:10 PM  Result Value Ref Range Status   MRSA by PCR NEGATIVE NEGATIVE Final    Comment:        The GeneXpert MRSA Assay (FDA approved for NASAL specimens only), is one component of a comprehensive MRSA colonization surveillance program. It is not intended to diagnose MRSA infection nor to guide or monitor treatment  for MRSA infections.      Studies: Dg Chest Port 1 View  07/10/2014   CLINICAL DATA:  Weakness.  Urinary incontinence  EXAM: PORTABLE CHEST - 1 VIEW  COMPARISON:  10/06/2013  FINDINGS: Hyperinflation and possible emphysematous change. There are bilateral nipple shadows which are symmetric. There is no edema, consolidation, effusion, or pneumothorax. Normal heart size and aortic contours.  IMPRESSION: COPD without acute superimposed disease.   Electronically Signed   By: Monte Fantasia M.D.   On: 07/10/2014 14:02    Scheduled Meds: . ceFEPime (MAXIPIME) IV  500 mg Intravenous Q24H  . gabapentin  300 mg Oral TID  . heparin  5,000 Units Subcutaneous 3 times per day   Continuous Infusions: .  sodium bicarbonate  infusion 1000 mL 125 mL/hr at 07/12/14 0539    Active Problems:   Renal failure   Metabolic acidosis   UTI (lower urinary tract infection)   Sepsis    Time spent: 35 min   Verneita Griffes, MD [facility] (P) [phone]

## 2014-07-12 NOTE — Significant Event (Signed)
Patient has been transferred safely to 1506, taken via bed. VS stable prior and during the transfer. No personal belongings at bedside that could be taken up to his new room (wallet has been lockup by nightshift RN last night). Report given to receiving RN.

## 2014-07-12 NOTE — Evaluation (Signed)
Physical Therapy Evaluation Patient Details Name: Efosa Treichler MRN: [id number] DOB: [birth year] Today's Date: 07/12/2014   History of Present Illness  74 yo male admitted with sepsis. Hx of COPD, chronic suprapubic catheter. Pt lives in a boarding house  Clinical Impression  On eval, pt required Min assist for bed mobility-able to sit EOB for at least 10 minutes. Pt declined standing/ambulation on today. Agreeable to progress activity on next session. At this time, recommend ST rehab at SNF depending on progress.     Follow Up Recommendations SNF (unless mobility improves significantly enough to return to boarding house)    Equipment Recommendations  None recommended by PT (pt states he has access to walker)    Recommendations for Other Services       Precautions / Restrictions Precautions Precautions: Fall Restrictions Weight Bearing Restrictions: No      Mobility  Bed Mobility Overal bed mobility: Needs Assistance Bed Mobility: Supine to Sit;Sit to Supine     Supine to sit: Min assist;HOB elevated Sit to supine: Min assist;HOB elevated   General bed mobility comments: Increased time. assist needed for R LE at times. Utilzed bedpad as well for scooting, positoning  Transfers                 General transfer comment: NT-pt declined to attempt on today  Ambulation/Gait                Stairs            Wheelchair Mobility    Modified Rankin (Stroke Patients Only)       Balance   Sitting-balance support: Feet supported Sitting balance-Leahy Scale: Good                                       Pertinent Vitals/Pain Pain Assessment: Faces Faces Pain Scale: Hurts little more Pain Location: L hip/thigh Pain Descriptors / Indicators: Aching;Sore Pain Intervention(s): Limited activity within patient's tolerance    Home Living Family/patient expects to be discharged to:: Other (Comment) (boarding house)                     Prior Function Level of Independence: Independent with assistive device(s)         Comments: using walker PTA     Hand Dominance        Extremity/Trunk Assessment   Upper Extremity Assessment: Overall WFL for tasks assessed           Lower Extremity Assessment: Generalized weakness      Cervical / Trunk Assessment: Normal  Communication   Communication: No difficulties  Cognition Arousal/Alertness: Awake/alert Behavior During Therapy: WFL for tasks assessed/performed Overall Cognitive Status: Within Functional Limits for tasks assessed                      General Comments      Exercises        Assessment/Plan    PT Assessment Patient needs continued PT services  PT Diagnosis Difficulty walking;Acute pain;Generalized weakness   PT Problem List Decreased strength;Decreased range of motion;Decreased activity tolerance;Decreased balance;Decreased mobility;Decreased knowledge of use of DME;Pain  PT Treatment Interventions DME instruction;Gait training;Functional mobility training;Therapeutic activities;Therapeutic exercise;Patient/family education;Balance training   PT Goals (Current goals can be found in the Care Plan section) Acute Rehab PT Goals Patient Stated Goal: to get better/stronger PT Goal Formulation: With patient Time For  Goal Achievement: 07/26/14 Potential to Achieve Goals: Fair    Frequency Min 3X/week   Barriers to discharge        Co-evaluation               End of Session   Activity Tolerance: Patient tolerated treatment well Patient left: in bed;with call bell/phone within reach;with bed alarm set           Time: 8682-5749 PT Time Calculation (min) (ACUTE ONLY): 20 min   Charges:   PT Evaluation $Initial PT Evaluation Tier I: 1 Procedure     PT G Codes:        Weston Anna, MPT Pager: [phone]

## 2014-07-13 ENCOUNTER — Inpatient Hospital Stay (HOSPITAL_COMMUNITY): Payer: Medicare Other

## 2014-07-13 LAB — CBC WITH DIFFERENTIAL/PLATELET
BASOS ABS: 0 10*3/uL (ref 0.0–0.1)
Basophils Relative: 0 % (ref 0–1)
Eosinophils Absolute: 0.1 10*3/uL (ref 0.0–0.7)
Eosinophils Relative: 1 % (ref 0–5)
HEMATOCRIT: 24.7 % — AB (ref 39.0–52.0)
Hemoglobin: 7.2 g/dL — ABNORMAL LOW (ref 13.0–17.0)
LYMPHS ABS: 2.1 10*3/uL (ref 0.7–4.0)
Lymphocytes Relative: 23 % (ref 12–46)
MCH: 20.8 pg — ABNORMAL LOW (ref 26.0–34.0)
MCHC: 29.1 g/dL — AB (ref 30.0–36.0)
MCV: 71.4 fL — ABNORMAL LOW (ref 78.0–100.0)
MONOS PCT: 9 % (ref 3–12)
Monocytes Absolute: 0.8 10*3/uL (ref 0.1–1.0)
NEUTROS PCT: 67 % (ref 43–77)
Neutro Abs: 6.2 10*3/uL (ref 1.7–7.7)
Platelets: 339 10*3/uL (ref 150–400)
RBC: 3.46 MIL/uL — AB (ref 4.22–5.81)
RDW: 15.1 % (ref 11.5–15.5)
WBC: 9.2 10*3/uL (ref 4.0–10.5)

## 2014-07-13 LAB — RENAL FUNCTION PANEL
Albumin: 1.7 g/dL — ABNORMAL LOW (ref 3.5–5.2)
Anion gap: 10 (ref 5–15)
BUN: 66 mg/dL — ABNORMAL HIGH (ref 6–23)
CALCIUM: 9.4 mg/dL (ref 8.4–10.5)
CHLORIDE: 93 mmol/L — AB (ref 96–112)
CO2: 33 mmol/L — AB (ref 19–32)
CREATININE: 4.01 mg/dL — AB (ref 0.50–1.35)
GFR calc Af Amer: 16 mL/min — ABNORMAL LOW (ref >90)
GFR calc non Af Amer: 13 mL/min — ABNORMAL LOW (ref >90)
GLUCOSE: 76 mg/dL (ref 70–99)
POTASSIUM: 3.6 mmol/L (ref 3.5–5.1)
Phosphorus: 1.6 mg/dL — ABNORMAL LOW (ref 2.3–4.6)
SODIUM: 136 mmol/L (ref 135–145)

## 2014-07-13 LAB — URINE CULTURE: Colony Count: >=100000

## 2014-07-13 MED ORDER — FOSFOMYCIN TROMETHAMINE 3 G PO PACK
3.0000 g | PACK | Freq: Once | ORAL | Status: AC
  Administered 2014-07-13: 3 g via ORAL
  Filled 2014-07-13: qty 3

## 2014-07-13 MED ORDER — IMIPENEM-CILASTATIN 250 MG IV SOLR
250.0000 mg | Freq: Once | INTRAVENOUS | Status: AC
  Administered 2014-07-13: 250 mg via INTRAVENOUS
  Filled 2014-07-13: qty 250

## 2014-07-13 MED ORDER — SODIUM CHLORIDE 0.9 % IV SOLN
250.0000 mg | Freq: Two times a day (BID) | INTRAVENOUS | Status: DC
  Filled 2014-07-13: qty 250

## 2014-07-13 NOTE — Progress Notes (Signed)
[facility] PROGRESS NOTE  Carver Murakami VWU:[id number] DOB: 29-May-1940 DOA: 07/10/2014 PCP: Dorcas Mcmurray, MD  74 y/o ? 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 Cath  Admit 07/10/14 c weakness, Leg pain and found to be hypoglycemic with  frank PUS draining from suprapubic catheter For worsening kidney injury, Nephrology consulted It was felt that Renal failure was 2/2 to catheter clogging and as such, it was felt that the ESBL in the urine might be a contaminant therefore antibiotics were narrowed to Bactrim DS     Assessment/Plan:  Sepsis due to UTI  From ESBL [facility] Patient presented with UTI, had pus draining from the suprapubic catheter. UA and culture obtained.  Patient started on cefepime-->Carbapenem 4/27 Because this was considered to be a potenttial contaminant with catheter clogging, we changed patient over to Fosphomycin x 1 Suprapubic catheter was changed in the ED.  Acute kidney injury on CKD stage IV  Patient presented with acute kidney injury on CK D.  At the time of admission BUN/creatinine was 100/5.88, Appreciate input from nephrology  Improving trend  ? 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 We'll start the patient on gabapentin 300 mg by mouth 3 times a day Obtain Hip Xrays DG R 2-3 vw 4/28 If all stable will need further work-up as OP  Metabolic acidosis Patient presented with metabolic acidosis likely from RTA/acute kidney injury Patient started on bicarbonate infusion This morning CO2 is 22-->35-->33  to Saline 75 cc/hr Nephrology following  Hypercalcemia Patient presented  with calcium 13.5, with albumin of 2.8, the corrected calcium is 14.5.  currently improved  Hyperkalemia Patient came with potassium of 5.9, was given 1 dose of Kayexalate 30 g by mouth 1. This  morning the potassium is 2.9 Likely 2/2 to ion shifting from Bicarb D/c Bicarb gtt, Replace K  Anemia AoFe deficiency Consider Hemoccults and OP management Hemoglobin continues to drop below 8.    Will rpt cbc in am  DVT prophylaxis Heparin  Code Status: Full code Family Communication: No family at bedside Disposition Plan: potenttial d/c home am if Hip xray as well as CBC wnl without leukocytosis   Consultants:  Nephrology  Procedures:   suprapubic catheter replaced in the ED  Antibiotics:  Cefepime 07/10/14  HPI/Subjective:  Doing fair today.  Continues with R hip pain Relieved +/- with aspercreme No cp, no n/v/blurred or double vision  9 stools last pm!  Objective: Filed Vitals:   07/13/14 0610  BP: 95/55  Pulse: 86  Temp: 98.1 F (36.7 C)  Resp: 18    Intake/Output Summary (Last 24 hours) at 07/13/14 1519 Last data filed at 07/13/14 1028  Gross per 24 hour  Intake 1171.25 ml  Output   1800 ml  Net -628.75 ml   Filed Weights   07/10/14 1331 07/12/14 0500  Weight: 52.164 kg (115 lb) 55 kg (121 lb 4.1 oz)    Exam:   General:  Appears in no acute distress, frail and cachectic  Cardiovascular:  S1-S2 is regular  Respiratory:  clear to auscultation bilaterally  Abdomen:  soft, nontender, no organomegaly, suprapubic catheter in place  Musculoskeletal:  no edema noted in the lower extremities-excoriations to the LE's.  R hip is non tender  Data Reviewed: Basic Metabolic Panel:  Recent Labs Lab 07/10/14 1257 07/10/14 1425 07/11/14 0400 07/12/14 0345 07/13/14 0530  NA 138  --  139 141 136  K 5.9*  --  4.9 2.9* 3.6  CL 106  --  109 96 93*  CO2 15*  --  22 35* 33*  GLUCOSE 52*  --  85 120* 76  BUN 100*  --  84* 73* 66*  CREATININE 5.88*  --  4.95* 4.62* 4.01*  CALCIUM 13.5*  --  10.9* 9.9 9.4  PHOS  --  3.8  --  2.7 1.6*   Liver Function Tests:  Recent Labs Lab 07/10/14 1257 07/11/14 0400 07/12/14 0345 07/13/14 0530  AST 18  33  --   --   ALT 13 16  --   --   ALKPHOS 69 53  --   --   BILITOT 0.7 0.4  --   --   PROT 8.9* 6.5  --   --   ALBUMIN 2.8* 2.0* 1.8* 1.7*   No results for input(s): LIPASE, AMYLASE in the last 168 hours. No results for input(s): AMMONIA in the last 168 hours. CBC:  Recent Labs Lab 07/10/14 1257 07/11/14 0400 07/13/14 0530  WBC 12.6* 9.0 9.2  NEUTROABS 10.7*  --  6.2  HGB 10.2* 8.0* 7.2*  HCT 33.6* 26.5* 24.7*  MCV 72.4* 71.4* 71.4*  PLT 461* 362 339   Cardiac Enzymes: No results for input(s): CKTOTAL, CKMB, CKMBINDEX, TROPONINI in the last 168 hours. BNP (last 3 results) No results for input(s): BNP in the last 8760 hours.  ProBNP (last 3 results)  Recent Labs  10/06/13 2143  PROBNP 1216.0*    CBG:  Recent Labs Lab 07/11/14 0638 07/11/14 0735 07/11/14 1003 07/11/14 1229 07/11/14 1704  GLUCAP 104* 95 98 113* 113*    Recent Results (from the past 240 hour(s))  Blood culture (routine x 2)     Status: None (Preliminary result)   Collection Time: 07/10/14 12:58 PM  Result Value Ref Range Status   Specimen Description BLOOD LEFT ARM  Final   Special Requests BOTTLES DRAWN AEROBIC ONLY 10CC  Final   Culture   Final           BLOOD CULTURE RECEIVED NO GROWTH TO DATE CULTURE WILL BE HELD FOR 5 DAYS BEFORE ISSUING A FINAL NEGATIVE REPORT Performed at [facility]    Report Status PENDING  Incomplete  Blood culture (routine x 2)     Status: None (Preliminary result)   Collection Time: 07/10/14  2:59 PM  Result Value Ref Range Status   Specimen Description BLOOD LEFT WRIST  Final   Special Requests BOTTLES DRAWN AEROBIC ONLY 3CC  Final   Culture   Final           BLOOD CULTURE RECEIVED NO GROWTH TO DATE CULTURE WILL BE HELD FOR 5 DAYS BEFORE ISSUING A FINAL NEGATIVE REPORT Performed at [facility]    Report Status PENDING  Incomplete  Urine culture     Status: None   Collection Time: 07/10/14  4:10 PM  Result Value Ref Range Status    Specimen Description URINE, RANDOM  Final   Special Requests NONE  Final   Colony Count   Final    >=100,000 COLONIES/ML Performed at [facility]    Culture   Final    ESCHERICHIA COLI Note: Confirmed Extended Spectrum Beta-Lactamase Producer (ESBL) Note: CRITICAL RESULT CALLED TO, READ BACK BY AND VERIFIED WITH: BETTY JONES AT 5:40 A.M. ON 07/13/2014 WARRB Performed at [facility]    Report Status 07/13/2014  FINAL  Final   Organism ID, Bacteria ESCHERICHIA COLI  Final      Susceptibility   Escherichia coli - MIC*    AMPICILLIN >=32 RESISTANT Resistant     CEFAZOLIN >=64 RESISTANT Resistant     CEFTRIAXONE >=64 RESISTANT Resistant     CIPROFLOXACIN >=4 RESISTANT Resistant     GENTAMICIN <=1 SENSITIVE Sensitive     LEVOFLOXACIN >=8 RESISTANT Resistant     NITROFURANTOIN 32 SENSITIVE Sensitive     TOBRAMYCIN <=1 SENSITIVE Sensitive     TRIMETH/SULFA <=20 SENSITIVE Sensitive     IMIPENEM <=0.25 SENSITIVE Sensitive     PIP/TAZO <=4 SENSITIVE Sensitive     * ESCHERICHIA COLI  MRSA PCR Screening     Status: None   Collection Time: 07/10/14  4:10 PM  Result Value Ref Range Status   MRSA by PCR NEGATIVE NEGATIVE Final    Comment:        The GeneXpert MRSA Assay (FDA approved for NASAL specimens only), is one component of a comprehensive MRSA colonization surveillance program. It is not intended to diagnose MRSA infection nor to guide or monitor treatment for MRSA infections.   Clostridium Difficile by PCR     Status: None   Collection Time: 07/12/14  9:18 AM  Result Value Ref Range Status   C difficile by pcr NEGATIVE NEGATIVE Final     Studies: Dg Hip Unilat With Pelvis 2-3 Views Right  07/12/2014   CLINICAL DATA:  Right lateral hip pain x2 weeks, no no known injury  EXAM: RIGHT HIP (WITH PELVIS) 2-3 VIEWS  COMPARISON:  None.  FINDINGS: No fracture or dislocation is seen.  Mild degenerative changes of the bilateral hips.  Visualized bony pelvis appears  intact.  IMPRESSION: No fracture or dislocation is seen.  Mild degenerative changes of the bilateral hips.   Electronically Signed   By: Julian Hy M.D.   On: 07/12/2014 11:42    Scheduled Meds: . darbepoetin (ARANESP) injection - NON-DIALYSIS  100 mcg Subcutaneous Q Wed-1800  . feeding supplement (NEPRO CARB STEADY)  237 mL Oral TID BM  . gabapentin  300 mg Oral TID  . heparin  5,000 Units Subcutaneous 3 times per day  . imipenem-cilastatin  250 mg Intravenous Q12H   Continuous Infusions:    Active Problems:   Renal failure   Metabolic acidosis   UTI (lower urinary tract infection)   Sepsis    Time spent: 25 min   Verneita Griffes, MD [facility] [phone]

## 2014-07-13 NOTE — Progress Notes (Signed)
Patient ID: [name], male   DOB: [birth year], 74 y.o.   MRN: [id number]  [facility] Progress Note    Assessment/ Plan:   1. Acute renal failure on chronic kidney disease stage IV: Acute renal failure from obstruction- catheter plugging due to purulent urine/UTI with sepsis. Renal function continues to show slow improvement with good urine output. No acute electrolyte abnormality/dialysis needs at this time.  2. Hypokalemia: Corrected with oral replacement and discontinuation of sodium bicarbonate drip-continue to monitor 3. Anion gap metabolic acidosis: Corrected, now alkalotic on sodium bicarbonate-this has been discontinued. 4. Hypercalcemia: Secondary to acute renal failure and corrected with intravenous fluids/renal recovery. PTH levels appropriately suppressed. 5. Escherichia coli Urinary tract infection with sepsis: Status post exchange of his Foley catheter and flushes overnight, may be able to narrow antibiotic to ceftriaxone based on sensitivity. 6. Anemia: Given a dose of Aranesp yesterday-low iron saturations but ferritin prohibitive to intravenous iron.  Renal function continues to show recovery and no active intervention needed at this time: I have set him up to follow up with me at [facility] on 07/26/14 at 1 PM-details updated in chart. We'll sign off at this time, reconsult if needed.  Subjective:   Reports some vivid dreams overnight and still complains of intermittent right-sided hip pain.    Objective:   BP 95/55 mmHg  Pulse 86  Temp(Src) 98.1 F (36.7 C) (Oral)  Resp 18  Ht '6\' 5"'$  (1.956 m)  Wt 55 kg (121 lb 4.1 oz)  BMI 14.38 kg/m2  SpO2 100%  Intake/Output Summary (Last 24 hours) at 07/13/14 1223 Last data filed at 07/13/14 1028  Gross per 24 hour  Intake 1321.25 ml  Output   1950 ml  Net -628.75 ml   Weight change:   Physical Exam: Gen: Appears to be comfortable resting in bed CVS: Pulse regular in rate and rhythm Resp: Coarse  breath sounds-no distinct rales Abd: Soft, flat, nontender Ext: No lower extremity edema  Imaging: Dg Hip Unilat With Pelvis 2-3 Views Right  07/12/2014   CLINICAL DATA:  Right lateral hip pain x2 weeks, no no known injury  EXAM: RIGHT HIP (WITH PELVIS) 2-3 VIEWS  COMPARISON:  None.  FINDINGS: No fracture or dislocation is seen.  Mild degenerative changes of the bilateral hips.  Visualized bony pelvis appears intact.  IMPRESSION: No fracture or dislocation is seen.  Mild degenerative changes of the bilateral hips.   Electronically Signed   By: Julian Hy M.D.   On: 07/12/2014 11:42    Labs: BMET  Recent Labs Lab 07/10/14 1257 07/10/14 1425 07/11/14 0400 07/12/14 0345 07/13/14 0530  NA 138  --  139 141 136  K 5.9*  --  4.9 2.9* 3.6  CL 106  --  109 96 93*  CO2 15*  --  22 35* 33*  GLUCOSE 52*  --  85 120* 76  BUN 100*  --  84* 73* 66*  CREATININE 5.88*  --  4.95* 4.62* 4.01*  CALCIUM 13.5*  --  10.9* 9.9 9.4  PHOS  --  3.8  --  2.7 1.6*   CBC  Recent Labs Lab 07/10/14 1257 07/11/14 0400 07/13/14 0530  WBC 12.6* 9.0 9.2  NEUTROABS 10.7*  --  6.2  HGB 10.2* 8.0* 7.2*  HCT 33.6* 26.5* 24.7*  MCV 72.4* 71.4* 71.4*  PLT 461* 362 339    Medications:    . darbepoetin (ARANESP) injection - NON-DIALYSIS  100 mcg Subcutaneous Q Wed-1800  .  feeding supplement (NEPRO CARB STEADY)  237 mL Oral TID BM  . gabapentin  300 mg Oral TID  . heparin  5,000 Units Subcutaneous 3 times per day  . imipenem-cilastatin  250 mg Intravenous Q12H   Elmarie Shiley, MD 07/13/2014, 12:23 PM

## 2014-07-13 NOTE — Progress Notes (Signed)
ANTIBIOTIC CONSULT NOTE - INITIAL  Pharmacy Consult for Primaxin Indication: UTI--e. Coli (ESBL)  No Known Allergies  Patient Measurements: Height: '6\' 5"'$  (195.6 cm) Weight: 121 lb 4.1 oz (55 kg) IBW/kg (Calculated) : 89.1 Adjusted Body Weight:   Vital Signs: Temp: 98.8 F (37.1 C) (04/27 2156) Temp Source: Oral (04/27 2156) BP: 116/65 mmHg (04/27 2156) Pulse Rate: 92 (04/27 2156) Intake/Output from previous day: 04/27 0701 - 04/28 0700 In: 1808.8 [P.O.:780; I.V.:978.8; IV Piggyback:50] Out: 1575 [Urine:1575] Intake/Output from this shift: Total I/O In: 420 [P.O.:420] Out: 500 [Urine:500]  Labs:  Recent Labs  07/10/14 1257 07/11/14 0400 07/12/14 0345 07/13/14 0530  WBC 12.6* 9.0  --  9.2  HGB 10.2* 8.0*  --  7.2*  PLT 461* 362  --  339  CREATININE 5.88* 4.95* 4.62*  --    Estimated Creatinine Clearance: 10.9 mL/min (by C-G formula based on Cr of 4.62). No results for input(s): VANCOTROUGH, VANCOPEAK, VANCORANDOM, GENTTROUGH, GENTPEAK, GENTRANDOM, TOBRATROUGH, TOBRAPEAK, TOBRARND, AMIKACINPEAK, AMIKACINTROU, AMIKACIN in the last 72 hours.   Microbiology: Recent Results (from the past 720 hour(s))  Blood culture (routine x 2)     Status: None (Preliminary result)   Collection Time: 07/10/14 12:58 PM  Result Value Ref Range Status   Specimen Description BLOOD LEFT ARM  Final   Special Requests BOTTLES DRAWN AEROBIC ONLY 10CC  Final   Culture   Final           BLOOD CULTURE RECEIVED NO GROWTH TO DATE CULTURE WILL BE HELD FOR 5 DAYS BEFORE ISSUING A FINAL NEGATIVE REPORT Performed at [facility]    Report Status PENDING  Incomplete  Blood culture (routine x 2)     Status: None (Preliminary result)   Collection Time: 07/10/14  2:59 PM  Result Value Ref Range Status   Specimen Description BLOOD LEFT WRIST  Final   Special Requests BOTTLES DRAWN AEROBIC ONLY 3CC  Final   Culture   Final           BLOOD CULTURE RECEIVED NO GROWTH TO DATE CULTURE WILL BE  HELD FOR 5 DAYS BEFORE ISSUING A FINAL NEGATIVE REPORT Performed at [facility]    Report Status PENDING  Incomplete  Urine culture     Status: None   Collection Time: 07/10/14  4:10 PM  Result Value Ref Range Status   Specimen Description URINE, RANDOM  Final   Special Requests NONE  Final   Colony Count   Final    >=100,000 COLONIES/ML Performed at [facility]    Culture   Final    ESCHERICHIA COLI Note: Confirmed Extended Spectrum Beta-Lactamase Producer (ESBL) Note: CRITICAL RESULT CALLED TO, READ BACK BY AND VERIFIED WITH: BETTY JONES AT 5:40 A.M. ON 07/13/2014 WARRB Performed at [facility]    Report Status 07/13/2014 FINAL  Final   Organism ID, Bacteria ESCHERICHIA COLI  Final      Susceptibility   Escherichia coli - MIC*    AMPICILLIN >=32 RESISTANT Resistant     CEFAZOLIN >=64 RESISTANT Resistant     CEFTRIAXONE >=64 RESISTANT Resistant     CIPROFLOXACIN >=4 RESISTANT Resistant     GENTAMICIN <=1 SENSITIVE Sensitive     LEVOFLOXACIN >=8 RESISTANT Resistant     NITROFURANTOIN 32 SENSITIVE Sensitive     TOBRAMYCIN <=1 SENSITIVE Sensitive     TRIMETH/SULFA <=20 SENSITIVE Sensitive     IMIPENEM <=0.25 SENSITIVE Sensitive     PIP/TAZO <=4 SENSITIVE Sensitive     *  ESCHERICHIA COLI  MRSA PCR Screening     Status: None   Collection Time: 07/10/14  4:10 PM  Result Value Ref Range Status   MRSA by PCR NEGATIVE NEGATIVE Final    Comment:        The GeneXpert MRSA Assay (FDA approved for NASAL specimens only), is one component of a comprehensive MRSA colonization surveillance program. It is not intended to diagnose MRSA infection nor to guide or monitor treatment for MRSA infections.   Clostridium Difficile by PCR     Status: None   Collection Time: 07/12/14  9:18 AM  Result Value Ref Range Status   C difficile by pcr NEGATIVE NEGATIVE Final    Medical History: Past Medical History  Diagnosis Date  . COPD (chronic obstructive  pulmonary disease)     Medications:  Anti-infectives    Start     Dose/Rate Route Frequency Ordered Stop   07/13/14 1800  imipenem-cilastatin (PRIMAXIN) 250 mg in sodium chloride 0.9 % 100 mL IVPB     250 mg 200 mL/hr over 30 Minutes Intravenous Every 12 hours 07/13/14 0616     07/13/14 0630  imipenem-cilastatin (PRIMAXIN) 250 mg in sodium chloride 0.9 % 100 mL IVPB     250 mg 200 mL/hr over 30 Minutes Intravenous  Once 07/13/14 0616     07/11/14 1600  ceFEPIme (MAXIPIME) 500 mg in dextrose 5 % 50 mL IVPB  Status:  Discontinued     500 mg 100 mL/hr over 30 Minutes Intravenous Every 24 hours 07/10/14 1448 07/13/14 0553   07/10/14 1315  ceFEPIme (MAXIPIME) 2 g in dextrose 5 % 50 mL IVPB     2 g 100 mL/hr over 30 Minutes Intravenous  Once 07/10/14 1300 07/10/14 1610     Assessment: Patient with UTI, being treated with cefepime.  E. Coli resulted as ESBL, change to primaxin per pharmacy.  Patient with very poor renal function.  Goal of Therapy:  Primaxin dosed based on patient weight and renal function   Plan:  Follow up culture results  Primaxin '250mg'$  iv q12hr  Nani Skillern Crowford 07/13/2014,6:17 AM

## 2014-07-13 NOTE — Progress Notes (Addendum)
CRITICAL VALUE ALERT  Critical value received:  Urine culture + for Ecoli and ESBL  Date of notification:  07/13/14  Time of notification:  0539  Critical value read back:yes  Nurse who received alert: Jearld Shines  MD notified (1st page):  Dr. Hal Hope  Time of first page:  0543  MD notified (2nd page):  Time of second page:  Responding MD:    Time MD responded:

## 2014-07-13 NOTE — Clinical Social Work Note (Signed)
Clinical Social Work Assessment  Patient Details  Name: [name] MRN: [id number] Date of Birth: 1940/09/05  Date of referral:  07/13/14               Reason for consult:  Discharge Planning                Permission sought to share information with:    Permission granted to share information::  No  Name::        Agency::  SNF   Relationship::  patient denies any family involvement  Contact Information:     Housing/Transportation Living arrangements for the past 2 months:  Apartment Source of Information:  Patient Patient Interpreter Needed:  None Criminal Activity/Legal Involvement Pertinent to Current Situation/Hospitalization:  No - Comment as needed Significant Relationships:  None Lives with:  Self Do you feel safe going back to the place where you live?  Yes Need for family participation in patient care:  No (Coment)  Care giving concerns:   CSW referred to patient today for ?SNF. Patient reports he lives alone in a boarding house where he has lived for 3 years.  He denies any family involvement or friends.    Social Worker assessment / plan:  CSW discussed PT's recommendation for SNF at dc. He thinks he has been to one in the past but doesn't recall  where.   "My legs got weak and I haven't been eating." He shared with CSW that has some kidney problems going on as well. Patient agrees to going to SNF at dc for some rehab- he asked about getting Medicaid- "my income is around $800".  CSW explained process for applying for Medicaid as well as the determination.  Patient will benefit from assistance with Medicaid application while at SNF-  Patient inquiring about getting "some different housing". He shared with CSW that he is hopeful to get his own housing and live more independently than he is at the boarding house. CSW encouraged him to pursue this while in the SNF and may be able to get help with this by SNF staff.    Employment status:  Retired Forensic scientist:   Medicare PT Recommendations:  [place] / Referral to community resources:  [place]  Patient/Family's Response to care:   Patient was pleasant, appropriate and appreciative of CSW assistance and support. Patient appears motivated to work with therapy to regainhis strength and independence so he can return home alone. Patient/Family's Understanding of and Emotional Response to Diagnosis, Current Treatment, and Prognosis:   Patient voiced concern related to his leg weakness and his kidney issues- he feels comfortable with his care and treatment plans including plans for SNF at dc.  Emotional Assessment Appearance:  Appears stated age Attitude/Demeanor/Rapport:  Other Affect (typically observed):  Stable, Appropriate Orientation:  Oriented to Self, Oriented to Place, Oriented to  Time, Oriented to Situation Alcohol / Substance use:  Not Applicable Psych involvement (Current and /or in the community):  No (Comment)  Discharge Needs  Concerns to be addressed:  Discharge Planning Concerns, Home Safety Concerns Readmission within the last 30 days:  No Current discharge risk:  Physical Impairment Barriers to Discharge:  No Barriers Identified   Ludwig Clarks, LCSW 07/13/2014, 2:49 PM

## 2014-07-14 ENCOUNTER — Ambulatory Visit
Admit: 2014-07-14 | Discharge: 2014-07-14 | Disposition: A | Discharge status: Home / Self Care | Payer: Medicare Other | Attending: Radiation Oncology | Admitting: Radiation Oncology

## 2014-07-14 ENCOUNTER — Inpatient Hospital Stay (HOSPITAL_COMMUNITY): Payer: Medicare Other

## 2014-07-14 DIAGNOSIS — C7949 Secondary malignant neoplasm of other parts of nervous system: Secondary | ICD-10-CM

## 2014-07-14 DIAGNOSIS — J90 Pleural effusion, not elsewhere classified: Secondary | ICD-10-CM

## 2014-07-14 DIAGNOSIS — N189 Chronic kidney disease, unspecified: Secondary | ICD-10-CM

## 2014-07-14 DIAGNOSIS — D509 Iron deficiency anemia, unspecified: Secondary | ICD-10-CM

## 2014-07-14 DIAGNOSIS — C801 Malignant (primary) neoplasm, unspecified: Secondary | ICD-10-CM

## 2014-07-14 DIAGNOSIS — A4151 Sepsis due to Escherichia coli [E. coli]: Principal | ICD-10-CM

## 2014-07-14 DIAGNOSIS — E46 Unspecified protein-calorie malnutrition: Secondary | ICD-10-CM

## 2014-07-14 DIAGNOSIS — C7951 Secondary malignant neoplasm of bone: Secondary | ICD-10-CM

## 2014-07-14 DIAGNOSIS — Z85038 Personal history of other malignant neoplasm of large intestine: Secondary | ICD-10-CM

## 2014-07-14 DIAGNOSIS — C7989 Secondary malignant neoplasm of other specified sites: Secondary | ICD-10-CM

## 2014-07-14 LAB — CBC WITH DIFFERENTIAL/PLATELET
BASOS ABS: 0 10*3/uL (ref 0.0–0.1)
Basophils Relative: 0 % (ref 0–1)
EOS ABS: 0.2 10*3/uL (ref 0.0–0.7)
EOS PCT: 2 % (ref 0–5)
HEMATOCRIT: 26 % — AB (ref 39.0–52.0)
Hemoglobin: 7.8 g/dL — ABNORMAL LOW (ref 13.0–17.0)
Lymphocytes Relative: 25 % (ref 12–46)
Lymphs Abs: 2.2 10*3/uL (ref 0.7–4.0)
MCH: 21.4 pg — AB (ref 26.0–34.0)
MCHC: 30 g/dL (ref 30.0–36.0)
MCV: 71.4 fL — AB (ref 78.0–100.0)
MONO ABS: 0.9 10*3/uL (ref 0.1–1.0)
Monocytes Relative: 10 % (ref 3–12)
Neutro Abs: 5.6 10*3/uL (ref 1.7–7.7)
Neutrophils Relative %: 63 % (ref 43–77)
Platelets: 342 10*3/uL (ref 150–400)
RBC: 3.64 MIL/uL — AB (ref 4.22–5.81)
RDW: 14.9 % (ref 11.5–15.5)
WBC: 8.9 10*3/uL (ref 4.0–10.5)

## 2014-07-14 LAB — BASIC METABOLIC PANEL
Anion gap: 8 (ref 5–15)
BUN: 63 mg/dL — AB (ref 6–23)
CO2: 30 mmol/L (ref 19–32)
Calcium: 9.4 mg/dL (ref 8.4–10.5)
Chloride: 98 mmol/L (ref 96–112)
Creatinine, Ser: 3.84 mg/dL — ABNORMAL HIGH (ref 0.50–1.35)
GFR calc Af Amer: 16 mL/min — ABNORMAL LOW (ref >90)
GFR calc non Af Amer: 14 mL/min — ABNORMAL LOW (ref >90)
Glucose, Bld: 74 mg/dL (ref 70–99)
Potassium: 3.6 mmol/L (ref 3.5–5.1)
SODIUM: 136 mmol/L (ref 135–145)

## 2014-07-14 MED ORDER — GABAPENTIN 100 MG PO CAPS
100.0000 mg | ORAL_CAPSULE | Freq: Three times a day (TID) | ORAL | Status: DC
  Administered 2014-07-14: 100 mg via ORAL
  Filled 2014-07-14 (×3): qty 1

## 2014-07-14 MED ORDER — DEXAMETHASONE 4 MG PO TABS
4.0000 mg | ORAL_TABLET | Freq: Two times a day (BID) | ORAL | Status: DC
  Administered 2014-07-14 – 2014-07-16 (×5): 4 mg via ORAL
  Filled 2014-07-14 (×7): qty 1

## 2014-07-14 MED ORDER — DEXAMETHASONE 4 MG PO TABS
4.0000 mg | ORAL_TABLET | Freq: Four times a day (QID) | ORAL | Status: DC
  Administered 2014-07-14: 4 mg via ORAL
  Filled 2014-07-14 (×3): qty 1

## 2014-07-14 MED ORDER — HYDROCODONE-ACETAMINOPHEN 5-325 MG PO TABS
1.0000 | ORAL_TABLET | Freq: Four times a day (QID) | ORAL | Status: DC | PRN
  Administered 2014-07-14 – 2014-07-17 (×6): 2 via ORAL
  Administered 2014-07-18 – 2014-07-20 (×4): 1 via ORAL
  Filled 2014-07-14: qty 2
  Filled 2014-07-14: qty 1
  Filled 2014-07-14: qty 2
  Filled 2014-07-14 (×3): qty 1
  Filled 2014-07-14: qty 2
  Filled 2014-07-14: qty 1
  Filled 2014-07-14 (×3): qty 2

## 2014-07-14 MED ORDER — IOHEXOL 300 MG/ML  SOLN
25.0000 mL | INTRAMUSCULAR | Status: AC
  Administered 2014-07-14 (×2): 25 mL via ORAL

## 2014-07-14 NOTE — Progress Notes (Signed)
Physical Therapy Treatment Patient Details Name: [name] MRN: [id number] DOB: 10/22/1940 Today's Date: 07/14/2014    History of Present Illness 74 yo male admitted with sepsis. Hx of COPD, chronic suprapubic catheter. Pt lives in a boarding house    PT Comments    Found pt to be incont of stools in bed.  Assisted with hygiene and transfer to [facility].  Pt required MAX encouragement to get OOB and amb. Very weak and unsteady gait.  Follow Up Recommendations  SNF     Equipment Recommendations       Recommendations for Other Services       Precautions / Restrictions Precautions Precautions: Fall Restrictions Weight Bearing Restrictions: No    Mobility  Bed Mobility Overal bed mobility: Needs Assistance Bed Mobility: Supine to Sit     Supine to sit: Mod assist     General bed mobility comments: increased time and assist with R LE due to pain  Transfers   Equipment used: Rolling walker (2 wheeled)             General transfer comment: 50% VC's on proper tech and hand placement as well as turn completion.  Assisted fro bed to [facility].  Ambulation/Gait Ambulation/Gait assistance: Mod assist Ambulation Distance (Feet): 12 Feet Assistive device: Rolling walker (2 wheeled) Gait Pattern/deviations: Step-to pattern;Step-through pattern;Shuffle;Decreased stride length Gait velocity: decreased   General Gait Details: 25% VcCs on upright posture and proper walker to self distance.     Stairs            Wheelchair Mobility    Modified Rankin (Stroke Patients Only)       Balance                                    Cognition Arousal/Alertness: Awake/alert Behavior During Therapy: WFL for tasks assessed/performed Overall Cognitive Status: Within Functional Limits for tasks assessed                      Exercises      General Comments        Pertinent Vitals/Pain Pain Assessment: 0-10 Pain Score: 5  Pain Location: L hip Pain  Descriptors / Indicators: Sharp Pain Intervention(s): Monitored during session;Repositioned    Home Living                      Prior Function            PT Goals (current goals can now be found in the care plan section) Progress towards PT goals: Progressing toward goals    Frequency  Min 3X/week    PT Plan      Co-evaluation             End of Session Equipment Utilized During Treatment: Gait belt Activity Tolerance: Patient limited by fatigue Patient left: in chair;with call bell/phone within reach     Time: 7673-4193 PT Time Calculation (min) (ACUTE ONLY): 26 min  Charges:  $Gait Training: 8-22 mins $Therapeutic Activity: 8-22 mins                    G Codes:      Rica Koyanagi  PTA [facility]  Acute  Rehab Pager      [phone]

## 2014-07-14 NOTE — Progress Notes (Addendum)
[facility] PROGRESS NOTE  [name] NUU:[id number] DOB: [birth year] DOA: 07/10/2014 PCP: Dorcas Mcmurray, MD  74 y/o ? 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 Cath  Admit 07/10/14 c weakness, Leg pain and found to be hypoglycemic with  frank PUS draining from suprapubic catheter For worsening kidney injury, Nephrology consulted It was felt that Renal failure was 2/2 to catheter clogging and as such, it was felt that the ESBL in the urine might be a contaminant therefore antibiotics were narrowed to Bactrim DS Because of boney pain in his R hip with a bruise, He had a hip xray 4/28 This suggested Metastatic lesion to that area-CT scan confirmed large Ishial met + pubic Met + 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 Sherrill-recommends CT chest to r/o Lung 1ry Note had 3 benign Colon polyps on rpt colo 7/302008-never followed up Dr Amedeo Plenty of GI Will get PSA Will see in consult See above  Sepsis due to UTI  From ESBL [facility] Patient presented with UTI, had pus draining from the suprapubic catheter. UA and culture obtained.  Patient started on cefepime-->Carbapenem 4/27 Because this was considered to be a potential contaminant with catheter clogging, we changed patient over to Fosphomycin x 1 Suprapubic catheter was changed in the ED.  Acute kidney injury on CKD stage IV  Patient presented with acute kidney injury on CK D.  At the time of admission BUN/creatinine was 100/5.88--->63/3.84 Appreciate input from nephrology who have signed off Improving trend  ?periph neuropathy -unlikely given large boney mets-d/c gabapentin 3/47  Metabolic acidosis Patient presented with  metabolic acidosis likely from RTA/acute kidney injury Patient started on bicarbonate infusion This morning CO2 is 22-->35-->33  to Saline 75 cc/hr Nephrology following  Hypercalcemia of malignancy Patient presented  with calcium 13.5, with albumin of 2.8, the corrected calcium is 14.5.  currently improved  Hyperkalemia Patient came with potassium of 5.9, was given 1 dose of Kayexalate 30 g by mouth 1. This morning the potassium is 2.9 Likely 2/2 to ion shifting from Bicarb D/c Bicarb gtt, Replace K  Anemia AoFe deficiency Consider Hemoccults and OP management Hemoglobin continues to drop below 8.    Will rpt cbc in am  DVT prophylaxis Heparin  Code Status: Full code Family Communication: No family at bedside Disposition Plan: potenttial d/c home am if Hip xray as well as CBC wnl without leukocytosis   Consultants:  Nephrology  Procedures:   suprapubic catheter replaced in the ED  Antibiotics:  Cefepime 07/10/14  HPI/Subjective:  Passing numerous stools still not really able to control them although has sensation No n/v No cp No fever Pain manageable  Objective: Filed Vitals:   07/14/14 0454  BP: 107/60  Pulse: 84  Temp: 98.1 F (36.7 C)  Resp: 16    Intake/Output Summary (Last 24 hours) at 07/14/14 1518 Last data filed at 07/14/14 1035  Gross per 24 hour  Intake    240 ml  Output   2775 ml  Net  -2535 ml   Filed Weights   07/10/14 1331 07/12/14 0500  Weight: 52.164 kg (115 lb) 55 kg (121 lb 4.1 oz)    Exam:   General:  Appears in no acute  distress, frail and cachectic  Cardiovascular:  S1-S2 is regular  Respiratory:  clear to auscultation bilaterally  Abdomen:  soft, nontender, no organomegaly, suprapubic catheter in place  Musculoskeletal:  no edema noted in the lower extremities-excoriations to the LE's. Hyperreflexic--CannotRaise Rleg-R hip has a bruise  Data Reviewed: Basic Metabolic Panel:  Recent Labs Lab 07/10/14 1257  07/10/14 1425 07/11/14 0400 07/12/14 0345 07/13/14 0530 07/14/14 0540  NA 138  --  139 141 136 136  K 5.9*  --  4.9 2.9* 3.6 3.6  CL 106  --  109 96 93* 98  CO2 15*  --  22 35* 33* 30  GLUCOSE 52*  --  85 120* 76 74  BUN 100*  --  84* 73* 66* 63*  CREATININE 5.88*  --  4.95* 4.62* 4.01* 3.84*  CALCIUM 13.5*  --  10.9* 9.9 9.4 9.4  PHOS  --  3.8  --  2.7 1.6*  --    Liver Function Tests:  Recent Labs Lab 07/10/14 1257 07/11/14 0400 07/12/14 0345 07/13/14 0530  AST 18 33  --   --   ALT 13 16  --   --   ALKPHOS 69 53  --   --   BILITOT 0.7 0.4  --   --   PROT 8.9* 6.5  --   --   ALBUMIN 2.8* 2.0* 1.8* 1.7*   No results for input(s): LIPASE, AMYLASE in the last 168 hours. No results for input(s): AMMONIA in the last 168 hours. CBC:  Recent Labs Lab 07/10/14 1257 07/11/14 0400 07/13/14 0530 07/14/14 0540  WBC 12.6* 9.0 9.2 8.9  NEUTROABS 10.7*  --  6.2 5.6  HGB 10.2* 8.0* 7.2* 7.8*  HCT 33.6* 26.5* 24.7* 26.0*  MCV 72.4* 71.4* 71.4* 71.4*  PLT 461* 362 339 342   Cardiac Enzymes: No results for input(s): CKTOTAL, CKMB, CKMBINDEX, TROPONINI in the last 168 hours. BNP (last 3 results) No results for input(s): BNP in the last 8760 hours.  ProBNP (last 3 results)  Recent Labs  10/06/13 2143  PROBNP 1216.0*    CBG:  Recent Labs Lab 07/11/14 0638 07/11/14 0735 07/11/14 1003 07/11/14 1229 07/11/14 1704  GLUCAP 104* 95 98 113* 113*    Recent Results (from the past 240 hour(s))  Blood culture (routine x 2)     Status: None (Preliminary result)   Collection Time: 07/10/14 12:58 PM  Result Value Ref Range Status   Specimen Description BLOOD LEFT ARM  Final   Special Requests BOTTLES DRAWN AEROBIC ONLY 10CC  Final   Culture   Final           BLOOD CULTURE RECEIVED NO GROWTH TO DATE CULTURE WILL BE HELD FOR 5 DAYS BEFORE ISSUING A FINAL NEGATIVE REPORT Performed at [facility]    Report Status PENDING  Incomplete  Blood culture (routine x 2)      Status: None (Preliminary result)   Collection Time: 07/10/14  2:59 PM  Result Value Ref Range Status   Specimen Description BLOOD LEFT WRIST  Final   Special Requests BOTTLES DRAWN AEROBIC ONLY 3CC  Final   Culture   Final           BLOOD CULTURE RECEIVED NO GROWTH TO DATE CULTURE WILL BE HELD FOR 5 DAYS BEFORE ISSUING A FINAL NEGATIVE REPORT Performed at [facility]    Report Status PENDING  Incomplete  Urine culture     Status: None   Collection Time: 07/10/14  4:10 PM  Result Value Ref Range Status   Specimen Description URINE, RANDOM  Final   Special Requests NONE  Final   Colony Count   Final    >=100,000 COLONIES/ML Performed at [facility]    Culture   Final    ESCHERICHIA COLI Note: Confirmed Extended Spectrum Beta-Lactamase Producer (ESBL) Note: CRITICAL RESULT CALLED TO, READ BACK BY AND VERIFIED WITH: BETTY JONES AT 5:40 A.M. ON 07/13/2014 WARRB Performed at [facility]    Report Status 07/13/2014 FINAL  Final   Organism ID, Bacteria ESCHERICHIA COLI  Final      Susceptibility   Escherichia coli - MIC*    AMPICILLIN >=32 RESISTANT Resistant     CEFAZOLIN >=64 RESISTANT Resistant     CEFTRIAXONE >=64 RESISTANT Resistant     CIPROFLOXACIN >=4 RESISTANT Resistant     GENTAMICIN <=1 SENSITIVE Sensitive     LEVOFLOXACIN >=8 RESISTANT Resistant     NITROFURANTOIN 32 SENSITIVE Sensitive     TOBRAMYCIN <=1 SENSITIVE Sensitive     TRIMETH/SULFA <=20 SENSITIVE Sensitive     IMIPENEM <=0.25 SENSITIVE Sensitive     PIP/TAZO <=4 SENSITIVE Sensitive     * ESCHERICHIA COLI  MRSA PCR Screening     Status: None   Collection Time: 07/10/14  4:10 PM  Result Value Ref Range Status   MRSA by PCR NEGATIVE NEGATIVE Final    Comment:        The GeneXpert MRSA Assay (FDA approved for NASAL specimens only), is one component of a comprehensive MRSA colonization surveillance program. It is not intended to diagnose MRSA infection nor to guide or monitor  treatment for MRSA infections.   Clostridium Difficile by PCR     Status: None   Collection Time: 07/12/14  9:18 AM  Result Value Ref Range Status   C difficile by pcr NEGATIVE NEGATIVE Final     Studies: Ct Abdomen Pelvis Wo Contrast  07/14/2014   CLINICAL DATA:  Urinary tract infection.  Renal failure.  EXAM: CT ABDOMEN AND PELVIS WITHOUT CONTRAST  TECHNIQUE: Multidetector CT imaging of the abdomen and pelvis was performed following the standard protocol without IV contrast.  COMPARISON:  10/08/2006  FINDINGS: Lower chest: Small left pleural effusion is identified with overlying atelectasis/ consolidation. Mild pleural thickening is identified overlying a small right effusion.  Hepatobiliary: Calcified granulomas identified within the liver and spleen. There is a stone within the gallbladder measuring 7 mm, image 33/series 2.  Pancreas: Neck  Spleen: Splenic granulomas noted.  Adrenals/Urinary Tract: Normal appearance of the adrenal glands. Bilateral hydronephrosis and hydroureter is again noted. There is thickening involving the wall of the right renal collecting system and right ureter. Scarring and cortical volume loss involving the right kidney is noted compatible with chronic pyelonephritis. Gas is identified within the left renal pelvis and left ureter. There is also thickening of the wall of the left renal collecting system and ureter. Urinary bladder is partially collapsed around a suprapubic catheter.  Stomach/Bowel: Moderate distension of the urinary bladder. The small bowel loops have a normal caliber without evidence for bowel obstruction. The colon has a normal caliber without evidence for obstruction.  Vascular/Lymphatic: Calcified atherosclerotic disease involves the abdominal aorta. No aneurysm. Enlarged retroperitoneal lymph nodes identified. Index periaortic node measures 1 cm, image 28/series 2. This is compared with 1.2 cm previously. Index periaortic lymph node measures 9 mm, image  35/series 2. Previously this measured the same.  Reproductive: Prostate gland enlargement is noted.  Other: No free fluid or fluid collections within the abdomen or pelvis.  Musculoskeletal: There is a large destructive lytic lesion involving the right iliac bone and gluteal musculature. This measures 7.3 x 4.8 cm lytic lesion involving the right pubic symphysis measures 3.4 by 4.3 cm. Destructive bone lesion involving the T11 vertebra measures 3.1 x 2.8 cm. There is tumor extending into the canal and has mass effect upon the cord, image 6 of series 2.  IMPRESSION: 1. Findings are consistent with the clinical history of chronic pyelonephritis and bilateral hydronephrosis. 2. Multi focal lytic bone metastasis. There is a lytic lesion involving the T11 vertebra which appears to extend into the canal and exhibits mass effect upon the spinal cord. 3. Metastatic adenopathy noted within the upper abdomen. 4. Aortic atherosclerosis. 5. Gallstone 6. Small bilateral pleural effusions left greater than right. These results will be called to the ordering clinician or representative by the Radiologist Assistant, and communication documented in the PACS or zVision Dashboard.   Electronically Signed   By: Kerby Moors M.D.   On: 07/14/2014 12:01   Dg Hips Bilat With Pelvis 3-4 Views  07/13/2014   CLINICAL DATA:  Right hip pain.  No known injury.  EXAM: BILATERAL HIP (WITH PELVIS) 3-4 VIEWS  COMPARISON:  07/12/2014  FINDINGS: There is a destructive lesion in the lateral aspect of the right pubic body. There is suggestion of a lytic lesion in the right ilium adjacent to the inferior aspect of the right sacroiliac joint. On the lateral view of the right hip there is a 1 cm lucent area in the proximal right femoral shaft which may be a lytic lesion. There is slight lucency at the superior lateral aspect of right acetabulum which could represent a lytic lesion.  IMPRESSION: Lytic lesions of the right pubic body and right ilium.  This could represent metastatic disease. 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   Electronically Signed   By: Lorriane Shire M.D.   On: 07/13/2014 15:53    Scheduled Meds: . darbepoetin (ARANESP) injection - NON-DIALYSIS  100 mcg Subcutaneous Q Wed-1800  . dexamethasone  4 mg Oral 4 times per day  . feeding supplement (NEPRO CARB STEADY)  237 mL Oral TID BM  . gabapentin  100 mg Oral TID  . heparin  5,000 Units Subcutaneous 3 times per day   Continuous Infusions:    Active Problems:   Renal failure   Metabolic acidosis   UTI (lower urinary tract infection)   Sepsis    Time spent: 25 min   Verneita Griffes, MD [facility] [phone]

## 2014-07-14 NOTE — Progress Notes (Signed)
Radiation Oncology         [phone] ________________________________  Initial inpatient Consultation  Name: [name] MRN: [id number]  Date: 07/14/2014  DOB: 16-Dec-1940  QJ:JHER Nori Riis, MD  Ladell Pier, MD   REFERRING PHYSICIAN: Ladell Pier, MD  DIAGNOSIS: 74 y.o. 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    ICD-9-CM ICD-10-CM   1. Metastasis to spine 198.3 C79.31     HISTORY OF PRESENT ILLNESS::[name] is a very nice 74 y.o. gentleman who underwent hemicolectomy in 02/25/2005 for Stage I adenocarcinoma of the colon. He presented to the ED on 07/10/14 complaining of weakness and urinary incontinence. He was also noted to have pus draining from the superpubic catheter. He was admitted for possible sepsis due to a UTI from E. coli. CT scan of the abdomin and pelvis in 07/14/14 due to renal failure. The CT study showed multifocal lytic bone metastases; of note there was a destructive lesion on the T11 vertebra extending into the spinal canal and exhibiting mass effect on the spinal cord. The pt has been referred to discuss poss radiation treatment options.   PREVIOUS RADIATION THERAPY: No  PAST MEDICAL HISTORY:  has a past medical history of COPD (chronic obstructive pulmonary disease).  2.2 cm moderately differentiated colon cancer (T2N0)-Stage I resected on 02/25/2005.  PAST SURGICAL HISTORY: Past Surgical History  Procedure Laterality Date  . Abdominal surgery    Right hemicolectomy in 02/25/2005  FAMILY HISTORY: family history is not on file.  SOCIAL HISTORY:  reports that he has been smoking.  He does not have any smokeless tobacco history on file. He reports that he drinks alcohol.  ALLERGIES: Review of patient's allergies indicates no known allergies.  MEDICATIONS:  No current facility-administered medications for this encounter.   No current outpatient prescriptions on file.    Facility-Administered Medications Ordered in Other Encounters  Medication Dose Route Frequency Provider Last Rate Last Dose  . acetaminophen (TYLENOL) tablet 650 mg  650 mg Oral Q6H PRN Oswald Hillock, MD   650 mg at 07/12/14 0915   Or  . acetaminophen (TYLENOL) suppository 650 mg  650 mg Rectal Q6H PRN Oswald Hillock, MD      . Darbepoetin Alfa (ARANESP) injection 100 mcg  100 mcg Subcutaneous Q Wed-1800 Elmarie Shiley, MD   100 mcg at 07/12/14 2113  . dexamethasone (DECADRON) tablet 4 mg  4 mg Oral 4 times per day Nita Sells, MD   4 mg at 07/14/14 1528  . feeding supplement (NEPRO CARB STEADY) liquid 237 mL  237 mL Oral TID BM Elmarie Shiley, MD   237 mL at 07/14/14 1527  . heparin injection 5,000 Units  5,000 Units Subcutaneous 3 times per day Oswald Hillock, MD   5,000 Units at 07/14/14 1527  . HYDROmorphone (DILAUDID) injection 0.5 mg  0.5 mg Intravenous Q4H PRN Oswald Hillock, MD   0.5 mg at 07/14/14 0723  . MUSCLE RUB CREA   Topical PRN Oswald Hillock, MD      . ondansetron [facility]) tablet 4 mg  4 mg Oral Q6H PRN Oswald Hillock, MD   4 mg at 07/14/14 1157   Or  . ondansetron (ZOFRAN) injection 4 mg  4 mg Intravenous Q6H PRN Oswald Hillock, MD        REVIEW OF SYSTEMS:  A 15 point review of systems is documented in the electronic medical record. This was obtained by  the nursing staff. However, I reviewed this with the patient to discuss relevant findings and make appropriate changes.  Pertinent items are noted in HPI.  No bowel incontinence. Some diarrhea, but C. Diff was negative Pt states he started feeling pain in his left leg and stopped walking 2 weeks ago due to weakness and right hip pain. Can still move and feel his feet. Loss of appetite. Pt states significant weight loss before he was admitted into the hospital. The suprapubic catheter placed b/c urethral stricture about 7 years ago.    PHYSICAL EXAM: pt resting comfortable in the bed. He appears cachetic from chronic disease. He is alert  and oriented. His head is [place] and at. Eyes are normal. Neck is supple. Pt moving all 4 extremities with generalized weakenss. Speech fluent and articulate. According to his hospitalist:   Filed Vitals:   07/13/14 0610  BP: 95/55  Pulse: 86  Temp: 98.1 F (36.7 C)  Resp: 18    Intake/Output Summary (Last 24 hours) at 07/13/14 1519 Last data filed at 07/13/14 1028  Gross per 24 hour  Intake 1171.25 ml  Output   1800 ml  Net -628.75 ml   Filed Weights   07/10/14 1331 07/12/14 0500  Weight: 52.164 kg (115 lb) 55 kg (121 lb 4.1 oz)    Exam:   General:  Appears in no acute distress, frail and cachectic  Cardiovascular:  S1-S2 is regular  Respiratory:  clear to auscultation bilaterally  Abdomen:  soft, nontender, no organomegaly, suprapubic catheter in place  Musculoskeletal:  no edema noted in the lower extremities-excoriations to the LE's.  R hip is non tender  KPS = 50  100 - Normal; no complaints; no evidence of disease. 90   - Able to carry on normal activity; minor signs or symptoms of disease. 80   - Normal activity with effort; some signs or symptoms of disease. 80   - Cares for self; unable to carry on normal activity or to do active work. 60   - Requires occasional assistance, but is able to care for most of his personal needs. 50   - Requires considerable assistance and frequent medical care. 55   - Disabled; requires special care and assistance. 68   - Severely disabled; hospital admission is indicated although death not imminent. 91   - Very sick; hospital admission necessary; active supportive treatment necessary. 10   - Moribund; fatal processes progressing rapidly. 0     - Dead  Karnofsky DA, Abelmann [place], Craver LS and Burchenal JH [phone]) The use of the nitrogen mustards in the palliative treatment of carcinoma: with particular reference to bronchogenic carcinoma Cancer 1 [id number]  LABORATORY DATA:  Lab Results  Component Value Date   WBC 8.9 07/14/2014    HGB 7.8* 07/14/2014   HCT 26.0* 07/14/2014   MCV 71.4* 07/14/2014   PLT 342 07/14/2014   Lab Results  Component Value Date   NA 136 07/14/2014   K 3.6 07/14/2014   CL 98 07/14/2014   CO2 30 07/14/2014   Lab Results  Component Value Date   ALT 16 07/11/2014   AST 33 07/11/2014   ALKPHOS 53 07/11/2014   BILITOT 0.4 07/11/2014     RADIOGRAPHY: Ct Abdomen Pelvis Wo Contrast  07/14/2014   CLINICAL DATA:  Urinary tract infection.  Renal failure.  EXAM: CT ABDOMEN AND PELVIS WITHOUT CONTRAST  TECHNIQUE: Multidetector CT imaging of the abdomen and pelvis was performed following the standard protocol without IV  contrast.  COMPARISON:  10/08/2006  FINDINGS: Lower chest: Small left pleural effusion is identified with overlying atelectasis/ consolidation. Mild pleural thickening is identified overlying a small right effusion.  Hepatobiliary: Calcified granulomas identified within the liver and spleen. There is a stone within the gallbladder measuring 7 mm, image 33/series 2.  Pancreas: Neck  Spleen: Splenic granulomas noted.  Adrenals/Urinary Tract: Normal appearance of the adrenal glands. Bilateral hydronephrosis and hydroureter is again noted. There is thickening involving the wall of the right renal collecting system and right ureter. Scarring and cortical volume loss involving the right kidney is noted compatible with chronic pyelonephritis. Gas is identified within the left renal pelvis and left ureter. There is also thickening of the wall of the left renal collecting system and ureter. Urinary bladder is partially collapsed around a suprapubic catheter.  Stomach/Bowel: Moderate distension of the urinary bladder. The small bowel loops have a normal caliber without evidence for bowel obstruction. The colon has a normal caliber without evidence for obstruction.  Vascular/Lymphatic: Calcified atherosclerotic disease involves the abdominal aorta. No aneurysm. Enlarged retroperitoneal lymph nodes  identified. Index periaortic node measures 1 cm, image 28/series 2. This is compared with 1.2 cm previously. Index periaortic lymph node measures 9 mm, image 35/series 2. Previously this measured the same.  Reproductive: Prostate gland enlargement is noted.  Other: No free fluid or fluid collections within the abdomen or pelvis.  Musculoskeletal: There is a large destructive lytic lesion involving the right iliac bone and gluteal musculature. This measures 7.3 x 4.8 cm lytic lesion involving the right pubic symphysis measures 3.4 by 4.3 cm. Destructive bone lesion involving the T11 vertebra measures 3.1 x 2.8 cm. There is tumor extending into the canal and has mass effect upon the cord, image 6 of series 2.  IMPRESSION: 1. Findings are consistent with the clinical history of chronic pyelonephritis and bilateral hydronephrosis. 2. Multi focal lytic bone metastasis. There is a lytic lesion involving the T11 vertebra which appears to extend into the canal and exhibits mass effect upon the spinal cord. 3. Metastatic adenopathy noted within the upper abdomen. 4. Aortic atherosclerosis. 5. Gallstone 6. Small bilateral pleural effusions left greater than right. These results will be called to the ordering clinician or representative by the Radiologist Assistant, and communication documented in the PACS or zVision Dashboard.   Electronically Signed   By: Kerby Moors M.D.   On: 07/14/2014 12:01   Dg Chest Port 1 View  07/10/2014   CLINICAL DATA:  Weakness.  Urinary incontinence  EXAM: PORTABLE CHEST - 1 VIEW  COMPARISON:  10/06/2013  FINDINGS: Hyperinflation and possible emphysematous change. There are bilateral nipple shadows which are symmetric. There is no edema, consolidation, effusion, or pneumothorax. Normal heart size and aortic contours.  IMPRESSION: COPD without acute superimposed disease.   Electronically Signed   By: Monte Fantasia M.D.   On: 07/10/2014 14:02   Dg Hip Unilat With Pelvis 2-3 Views  Right  07/12/2014   CLINICAL DATA:  Right lateral hip pain x2 weeks, no no known injury  EXAM: RIGHT HIP (WITH PELVIS) 2-3 VIEWS  COMPARISON:  None.  FINDINGS: No fracture or dislocation is seen.  Mild degenerative changes of the bilateral hips.  Visualized bony pelvis appears intact.  IMPRESSION: No fracture or dislocation is seen.  Mild degenerative changes of the bilateral hips.   Electronically Signed   By: Julian Hy M.D.   On: 07/12/2014 11:42   Dg Hips Bilat With Pelvis 3-4 Views  07/13/2014  CLINICAL DATA:  Right hip pain.  No known injury.  EXAM: BILATERAL HIP (WITH PELVIS) 3-4 VIEWS  COMPARISON:  07/12/2014  FINDINGS: There is a destructive lesion in the lateral aspect of the right pubic body. There is suggestion of a lytic lesion in the right ilium adjacent to the inferior aspect of the right sacroiliac joint. On the lateral view of the right hip there is a 1 cm lucent area in the proximal right femoral shaft which may be a lytic lesion. There is slight lucency at the superior lateral aspect of right acetabulum which could represent a lytic lesion.  IMPRESSION: Lytic lesions of the right pubic body and right ilium. This could represent metastatic disease. 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   Electronically Signed   By: Lorriane Shire M.D.   On: 07/13/2014 15:53      IMPRESSION: Mr. Fosberg is a very nice gentleman 41 with a T11 metastases with extension into the spinal canal. He has a remote hx of colorectal cancer. However this is early stage and it was resected 10 yrs ago. His current presumed meta disease will require additional evaluation including biopsy to confirm histology.   PLAN: Today, I talked to the patient findings and work-up thus far.  We discussed the natural history of spinal metastases and general treatment, highlighting the role of radiotherapy in the management.  We discussed the available radiation techniques, and focused on the details of  logistics and delivery.  We reviewed the anticipated acute and late sequelae associated with radiation in this setting. The patient was encouraged to ask questions that I answered to the best of my ability. The patient would like to proceed with further work-up.  I would recommend the following colon:  1) MRI of the throracic and lumbar spine. 2) Biopsy of most accessible lesion, perhaps the pelvic bone met 3) Agree w/ Decadron   I will follow-up with patient after biopsy is resulted.  I spent 67mnutes minutes face to face with the patient and more than 50% of that time was spent in counseling and/or coordination of care.  This document serves as a record of services personally performed by MTyler Pita MD. It was created on his behalf by JDarcus Austin a trained medical scribe. The creation of this record is based on the scribe's personal observations and the provider's statements to them. This document has been checked and approved by the attending provider.      ------------------------------------------------  MSheral ApleyMTammi Klippel M.D.

## 2014-07-14 NOTE — Consult Note (Signed)
[place]  Telephone:(336) [place]                                MR#: [id number]  DOB: February 05, 1941                       CSN#: [id number]  Referring MD: Dr. Sheliah [facility]  Primary Provider:   Reason for Consult: Metastatic Cancer   [id number] [name] is a 74 y.o. male with a history of COPD, admitted on 4/25 with generalized weakness. Patient carries a diagnosis of Adenocarcinoma of the colon s/p hemicolectomy in 12/ 2006 as well, Stage I, not requiring chemo or radiation at the time.  During this admission, he was found to be septic from ESBL E. Coli, requiring IV antibiotics and multispecialty involvement. As he was complaining of bony pain at the right hip area, a hip x ray was performed on 4/28 revealing  Lytic lesions of the right pubic body and right ilium worrisome for metastatic disease CT of the abdomen and pelvis without contrast on 4/29 showed multi focal lytic bone metastasis. There is a lytic lesion involving the T11 vertebra which appears to extend into the canal and exhibits mass effect upon the spinal cord.  Metastatic adenopathy noted within the upper abdomen.   Small bilateral pleural effusions left greater than right were seen as well. No CT of the chest is available, but chest x ray is negative for acute findings.  Denies fevers, chills, night sweats or mucositis. Denies any productive cough or hemoptysis. Denies any chest pain or palpitations. Denies lower extremity swelling. Denies nausea, heartburn . Had numerous loose stools today. Appetite is normal. Denies any dysuria. Denies abnormal skin rashes, positive for neuropathy. Denies any bleeding issues such as epistaxis, hematemesis, hematuria or hematochezia. Denies headaches, seizures, vision changes, bladder or bowel incontinence. No confusion is reported. Ambulating with some difficulty due to right hip pain. Other labs show hypercalcemia,  admitted with a corrected Ca of 14.5, improved with hydration, now at 9.4, and chronic anemia We were requested to see this patient with recommendations.        PMH:  Past Medical History  Diagnosis Date  . COPD (chronic obstructive pulmonary disease)    History of CRF Iron Deficiency Anemia CAD  Past Oncological History:  [name] is a 74 year old gentleman admitted on February 14, 2005, with acute renal failure, dehydration, and a 10-poundweight loss, as well as anemia.  Dr. Amedeo Plenty evaluated the patient, performing a colonoscopy remarkable for a 2.5 cm flat plate-like mass at the ascending colon suspicious for carcinoma.  Surgery evaluated the patient on December 7,2006. Dr. Dalbert Batman. Resection was recommended. Right hemicolectomy with sentinel lymph node biopsy was done on February 25, 2005.  The pathology report, case #[id number], Dr. Saralyn Pilar, was positive for colorectal adenocarcinoma, moderately differentiated, 2.2 cm maximum size, all margins negative. The distance of the invasive carcinoma from the nearest margin was 4.5 cm from distal. No perforation of the visceral peritoneum. The mass invades into but not through the muscularis propria. No vascular or lymphatic invasion. Of nine lymph nodes, all negative. He is T2 N0 MX,Stage I  Surgeries:  Past Surgical History  Procedure Laterality Date  . Abdominal surgery    s/pSuprapubic Cath for urethral stricture ~ 2007  Cardiac Cath Dec 2006   Allergies: No Known  Allergies  Medications:   Scheduled Meds: . darbepoetin (ARANESP) injection - NON-DIALYSIS  100 mcg Subcutaneous Q Wed-1800  . dexamethasone  4 mg Oral 4 times per day  . feeding supplement (NEPRO CARB STEADY)  237 mL Oral TID BM  . gabapentin  100 mg Oral TID  . heparin  5,000 Units Subcutaneous 3 times per day   Continuous Infusions:  PRN Meds:.acetaminophen **OR** acetaminophen, HYDROmorphone (DILAUDID) injection, MUSCLE RUB, ondansetron **OR** ondansetron  (ZOFRAN) IV  QPR:FFMBWGYKZLDJT (DILAUDID) injection, MUSCLE RUB  ROS: Constitutional: Denies fevers, chills or abnormal night sweats Eyes: Denies blurriness of vision, double vision or watery eyes Ears, nose, mouth, throat, and face: Denies mucositis or sore throat Respiratory: Denies cough, dyspnea or wheezes Cardiovascular: Denies palpitation, chest discomfort or lower extremity swelling Gastrointestinal:  Denies nausea, heartburn or change in bowel habits Skin: He reports right hip bruising, venous stasis changes and onycchomychosis, as well as recent bed bug infection to his upper back Lymphatics: Denies new lymphadenopathy or easy bruising Neurological:Denies numbness, tingling or new weaknesses Behavioral/Psych: Mood is stable, no new changes  All other systems were reviewed with the patient and are negative.   Family History:    History reviewed. No pertinent family history.  Social History The patient is married. 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 No alcohol history. He smoked about two packs a day of cigarettes for at least 50 years. He lives in [place]. He is [place].  Physical Exam   Filed Vitals:   07/14/14 0454  BP: 107/60  Pulse: 84  Temp: 98.1 F (36.7 C)  Resp: 16   Filed Weights   07/10/14 1331 07/12/14 0500  Weight: 115 lb (52.164 kg) 121 lb 4.1 oz (55 kg)    GENERAL:alert, no distress and comfortable, frail, chronically ill appearing, cachectic SKIN: right hip bruising, venous stasis changes and onycchomychosis, as well as recent bed bug infection to his upper back EYES: normal, conjunctiva are pink and non-injected, sclera clear OROPHARYNX:no exudate, no erythema and lips, buccal mucosa, and tongue normal. edentulous  NECK: supple, thyroid normal size, non-tender, without nodularity LYMPH:  no palpable lymphadenopathy in the cervical, or inguinal. Shotty axillary nodes LUNGS: clear to auscultation  and percussion but atelectatic sounds are noted with normal breathing effort HEART: regular rate & rhythm and no murmurs and no lower extremity edema ABDOMEN:abdomen soft, non-tender and normal bowel sounds. Suprapubic catheter in place Musculoskeletal:no cyanosis of digits and no clubbing . Soft tissue fullness at the right posterior iliac with associated tenderness. PSYCH: alert & oriented x 3 with fluent speech NEURO: 4/5 strength of the right leg with straight leg raise and extension  CBC  Recent Labs Lab 07/10/14 1257 07/11/14 0400 07/13/14 0530 07/14/14 0540  WBC 12.6* 9.0 9.2 8.9  HGB 10.2* 8.0* 7.2* 7.8*  HCT 33.6* 26.5* 24.7* 26.0*  PLT 461* 362 339 342  MCV 72.4* 71.4* 71.4* 71.4*  MCH 22.0* 21.6* 20.8* 21.4*  MCHC 30.4 30.2 29.1* 30.0  RDW 15.8* 15.6* 15.1 14.9  LYMPHSABS 0.8  --  2.1 2.2  MONOABS 1.1*  --  0.8 0.9  EOSABS 0.0  --  0.1 0.2  BASOSABS 0.0  --  0.0 0.0    Anemia panel:   Recent Labs  07/11/14 1625  FERRITIN 1078*  TIBC 108*  IRON 16*    CMP    Recent Labs Lab 07/10/14 1257 07/11/14 0400 07/12/14 0345 07/13/14 0530 07/14/14 0540  NA  138 139 141 136 136  K 5.9* 4.9 2.9* 3.6 3.6  CL 106 109 96 93* 98  CO2 15* 22 35* 33* 30  GLUCOSE 52* 85 120* 76 74  BUN 100* 84* 73* 66* 63*  CREATININE 5.88* 4.95* 4.62* 4.01* 3.84*  CALCIUM 13.5* 10.9* 9.9 9.4 9.4  AST 18 33  --   --   --   ALT 13 16  --   --   --   ALKPHOS 69 53  --   --   --   BILITOT 0.7 0.4  --   --   --         Component Value Date/Time   BILITOT 0.4 07/11/2014 0400   BILIDIR 0.8* 10/07/2006 1614   IBILI 0.8 10/07/2006 1614     No results for input(s): INR, PROTIME in the last 168 hours.  No results for input(s): DDIMER in the last 72 hours.  Imaging Studies:  Ct Abdomen Pelvis Wo Contrast  07/14/2014   CLINICAL DATA:  Urinary tract infection.  Renal failure.  EXAM: CT ABDOMEN AND PELVIS WITHOUT CONTRAST  TECHNIQUE: Multidetector CT imaging of the abdomen and  pelvis was performed following the standard protocol without IV contrast.  COMPARISON:  10/08/2006  FINDINGS: Lower chest: Small left pleural effusion is identified with overlying atelectasis/ consolidation. Mild pleural thickening is identified overlying a small right effusion.  Hepatobiliary: Calcified granulomas identified within the liver and spleen. There is a stone within the gallbladder measuring 7 mm, image 33/series 2.  Pancreas: Neck  Spleen: Splenic granulomas noted.  Adrenals/Urinary Tract: Normal appearance of the adrenal glands. Bilateral hydronephrosis and hydroureter is again noted. There is thickening involving the wall of the right renal collecting system and right ureter. Scarring and cortical volume loss involving the right kidney is noted compatible with chronic pyelonephritis. Gas is identified within the left renal pelvis and left ureter. There is also thickening of the wall of the left renal collecting system and ureter. Urinary bladder is partially collapsed around a suprapubic catheter.  Stomach/Bowel: Moderate distension of the urinary bladder. The small bowel loops have a normal caliber without evidence for bowel obstruction. The colon has a normal caliber without evidence for obstruction.  Vascular/Lymphatic: Calcified atherosclerotic disease involves the abdominal aorta. No aneurysm. Enlarged retroperitoneal lymph nodes identified. Index periaortic node measures 1 cm, image 28/series 2. This is compared with 1.2 cm previously. Index periaortic lymph node measures 9 mm, image 35/series 2. Previously this measured the same.  Reproductive: Prostate gland enlargement is noted.  Other: No free fluid or fluid collections within the abdomen or pelvis.  Musculoskeletal: There is a large destructive lytic lesion involving the right iliac bone and gluteal musculature. This measures 7.3 x 4.8 cm lytic lesion involving the right pubic symphysis measures 3.4 by 4.3 cm. Destructive bone lesion  involving the T11 vertebra measures 3.1 x 2.8 cm. There is tumor extending into the canal and has mass effect upon the cord, image 6 of series 2.  IMPRESSION: 1. Findings are consistent with the clinical history of chronic pyelonephritis and bilateral hydronephrosis. 2. Multi focal lytic bone metastasis. There is a lytic lesion involving the T11 vertebra which appears to extend into the canal and exhibits mass effect upon the spinal cord. 3. Metastatic adenopathy noted within the upper abdomen. 4. Aortic atherosclerosis. 5. Gallstone 6. Small bilateral pleural effusions left greater than right. These results will be called to the ordering clinician or representative by the Radiologist Assistant, and communication  documented in the PACS or zVision Dashboard.   Electronically Signed   By: Kerby Moors M.D.   On: 07/14/2014 12:01   Dg Chest Port 1 View  07/10/2014   CLINICAL DATA:  Weakness.  Urinary incontinence  EXAM: PORTABLE CHEST - 1 VIEW  COMPARISON:  10/06/2013  FINDINGS: Hyperinflation and possible emphysematous change. There are bilateral nipple shadows which are symmetric. There is no edema, consolidation, effusion, or pneumothorax. Normal heart size and aortic contours.  IMPRESSION: COPD without acute superimposed disease.   Electronically Signed   By: Monte Fantasia M.D.   On: 07/10/2014 14:02   Dg Hip Unilat With Pelvis 2-3 Views Right  07/12/2014   CLINICAL DATA:  Right lateral hip pain x2 weeks, no no known injury  EXAM: RIGHT HIP (WITH PELVIS) 2-3 VIEWS  COMPARISON:  None.  FINDINGS: No fracture or dislocation is seen.  Mild degenerative changes of the bilateral hips.  Visualized bony pelvis appears intact.  IMPRESSION: No fracture or dislocation is seen.  Mild degenerative changes of the bilateral hips.   Electronically Signed   By: Julian Hy M.D.   On: 07/12/2014 11:42   Dg Hips Bilat With Pelvis 3-4 Views  07/13/2014   CLINICAL DATA:  Right hip pain.  No known injury.  EXAM:  BILATERAL HIP (WITH PELVIS) 3-4 VIEWS  COMPARISON:  07/12/2014  FINDINGS: There is a destructive lesion in the lateral aspect of the right pubic body. There is suggestion of a lytic lesion in the right ilium adjacent to the inferior aspect of the right sacroiliac joint. On the lateral view of the right hip there is a 1 cm lucent area in the proximal right femoral shaft which may be a lytic lesion. There is slight lucency at the superior lateral aspect of right acetabulum which could represent a lytic lesion.  IMPRESSION: Lytic lesions of the right pubic body and right ilium. This could represent metastatic disease. 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   Electronically Signed   By: Lorriane Shire M.D.   On: 07/13/2014 15:53     Assessment/Plan: 74 y.o.   Metastatic Bone Lesions History of Colon Cancer, Stage 1 Bilateral pleural Effusions CT of the abdomen and pelvis show multi focal lytic bone metastasis. There is a lytic lesion involving the T11 vertebra which appears to extend into the canal and exhibits mass effect upon the spinal cord. Metastatic adenopathy noted within the upper abdomen. Small bilateral pleural effusions left greater than right were seen as well.  No CT of the chest is available, but chest x ray is negative for acute findings. CT of the chest to complete staging Tissue Biopsy is recommended for diagnosis and further treatment options  Chronic renal failure Sepsis due to UTI, E Coli .   Chronic microcytic anemia    Malnutrition   Pain Due to malignancy  Poorly controlled with current regimen Will add Vicodin for better pain control.  Hypercalcemia  Full Code  Other medical issues as per admitting team     Rondel Jumbo, PA-C 07/14/2014 3:14 PM  [name] was interviewed and examined. The CT scans were reviewed.  Impression:  1. 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 Pain secondary to destructive lesion at  the right iliac  3. Escherichia coli urinary tract infection/bacteremia  4. Chronic renal failure  5.  Chronic microcytic anemia  6. History of stage I colon cancer December 2006  7. COPD  8.  Urethral obstruction, status post placement of a suprapubic catheter  9. Hypercalcemia-improved  [name] has a complex medical history. He was admitted with an Escherichia coli urinary tract infection and bacteremia.  He reports pain at the right "hip "for the past several weeks. Plain x-rays and a CT scan revealed multiple lytic bone lesions with a soft-tissue component. He is symptomatic from a destructive lesion at the right iliac. I doubt the current presentation is related to the remote history of colon cancer. The differential diagnosis includes a hematopoietic malignancy such as multiple myeloma or lymphoma. He had a negative serum protein electrophoresis in July 2015. The differential also includes a metastatic carcinoma such as lung cancer, a GI malignancy, or a urothelial tumor.  He does not appear to have a cord compression syndrome at present. I think it is reasonable to decrease the Decadron dose.  Recommendations: 1. Consult interventional radiology for a biopsy of the right iliac mass 2. Narcotic analgesics as needed for pain 3. Radiation oncology consult 4. Chest CT 5. Please call oncology as needed over the weekend. I will check on him 07/17/2014.

## 2014-07-14 NOTE — Clinical Social Work Placement (Signed)
   CLINICAL SOCIAL WORK PLACEMENT  NOTE  Date:  07/14/2014  Patient Details  Name: [name] MRN: [id number] Date of Birth: 10-May-1940  Clinical Social Work is seeking post-discharge placement for this patient at the [place] level of care (*CSW will initial, date and re-position this form in  chart as items are completed):  Yes   Patient/family provided with [facility] Work Department's list of facilities offering this level of care within the geographic area requested by the patient (or if unable, by the patient's family).  Yes   Patient/family informed of their freedom to choose among providers that offer the needed level of care, that participate in Medicare, Medicaid or managed care program needed by the patient, have an available bed and are willing to accept the patient.  Yes   Patient/family informed of [facility]'s ownership interest in [facility] and [facility], as well as of the fact that they are under no obligation to receive care at these facilities.  PASRR submitted to EDS on 07/13/14     PASRR number received on 07/13/14     Existing PASRR number confirmed on       FL2 transmitted to all facilities in geographic area requested by pt/family on 07/13/14     FL2 transmitted to all facilities within larger geographic area on       Patient informed that his/her managed care company has contracts with or will negotiate with certain facilities, including the following:            Patient/family informed of bed offers received.  Patient chooses bed at       Physician recommends and patient chooses bed at      Patient to be transferred to   on  .  Patient to be transferred to facility by       Patient family notified on   of transfer.  Name of family member notified:        PHYSICIAN       Additional Comment:    _______________________________________________ Ludwig Clarks, LCSW 07/14/2014, 3:00 PM

## 2014-07-15 ENCOUNTER — Inpatient Hospital Stay (HOSPITAL_COMMUNITY): Payer: Medicare Other

## 2014-07-15 ENCOUNTER — Encounter (HOSPITAL_COMMUNITY): Payer: Self-pay | Admitting: Radiology

## 2014-07-15 DIAGNOSIS — M899 Disorder of bone, unspecified: Secondary | ICD-10-CM

## 2014-07-15 LAB — PSA: PSA: 0.45 ng/mL (ref <=4.00)

## 2014-07-15 MED ORDER — HEPARIN SODIUM (PORCINE) 5000 UNIT/ML IJ SOLN: 5000.0000 [IU] | Freq: Three times a day (TID) | INTRAMUSCULAR | Status: DC

## 2014-07-15 MED ORDER — HEPARIN SODIUM (PORCINE) 5000 UNIT/ML IJ SOLN
5000.0000 [IU] | Freq: Three times a day (TID) | INTRAMUSCULAR | Status: DC
  Filled 2014-07-15 (×4): qty 1

## 2014-07-15 MED ORDER — HEPARIN SODIUM (PORCINE) 5000 UNIT/ML IJ SOLN
5000.0000 [IU] | Freq: Three times a day (TID) | INTRAMUSCULAR | Status: AC
  Administered 2014-07-15 – 2014-07-16 (×5): 5000 [IU] via SUBCUTANEOUS
  Filled 2014-07-15 (×3): qty 1

## 2014-07-15 NOTE — H&P (Signed)
Chief Complaint: Chief Complaint  Patient presents with  . Weakness  . Urinary Incontinence    Leaking indwelling catheter   Referring Physician(s): Oncology  History of Present Illness: [name] is a 74 y.o. male with a history of adenocarcinoma s/p hemicolectomy in 2006. He has been admitted for a UTI/sepsis and is also c/o worsening right hip and leg pain. 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 Oncology has evaluated the patient and requested an image guided biopsy. The patient denies any chest pain, shortness of breath or palpitations. He denies any active signs of bleeding or excessive bruising. The patient denies any history of sleep apnea or chronic oxygen use. He denies any known complications to sedation.   Past Medical History  Diagnosis Date  . COPD (chronic obstructive pulmonary disease)     Past Surgical History  Procedure Laterality Date  . Abdominal surgery      Allergies: Review of patient's allergies indicates no known allergies.  Medications: Prior to Admission medications   Medication Sig Start Date End Date Taking? Authorizing Provider  ferrous sulfate 325 (65 FE) MG tablet Take 1 tablet (325 mg total) by mouth daily with breakfast. 10/10/13  Yes Kinnie Feil, MD  omeprazole (PRILOSEC) 20 MG capsule Take 20 mg by mouth daily.   Yes Historical Provider, MD  sodium bicarbonate 650 MG tablet Take 1 tablet by mouth 2 (two) times daily. 05/24/14  Yes Historical Provider, MD  trolamine salicylate (ASPERCREME) 10 % cream Apply 1 application topically as needed for muscle pain.   Yes Historical Provider, MD     History reviewed. No pertinent family history.  History   Social History  . Marital Status: Single    Spouse Name: N/A  . Number of Children: N/A  . Years of Education: N/A   Social History Main Topics  . Smoking status: Current Every Day  Smoker  . Smokeless tobacco: Not on file  . Alcohol Use: Yes  . Drug Use: Not on file  . Sexual Activity: Not on file   Other Topics Concern  . None   Social History Narrative    Review of Systems: A 12 point ROS discussed and pertinent positives are indicated in the HPI above.  All other systems are negative.  Review of Systems  Vital Signs: BP 113/63 mmHg  Pulse 76  Temp(Src) 97.6 F (36.4 C) (Oral)  Resp 16  Ht '6\' 5"'$  (1.956 m)  Wt 121 lb 4.1 oz (55 kg)  BMI 14.38 kg/m2  SpO2 99%  Physical Exam General: A&Ox3, cachetic appearing  Heart: RRR without M/G/R Lungs: CTA b/l Abd: Soft, NT, ND  Mallampati Score:  MD Evaluation Airway: WNL Heart: WNL Abdomen: WNL Chest/ Lungs: WNL ASA  Classification: 3 Mallampati/Airway Score: Two  Imaging: Ct Abdomen Pelvis Wo Contrast  07/14/2014   CLINICAL DATA:  Urinary tract infection.  Renal failure.  EXAM: CT ABDOMEN AND PELVIS WITHOUT CONTRAST  TECHNIQUE: Multidetector CT imaging of the abdomen and pelvis was performed following the standard protocol without IV contrast.  COMPARISON:  10/08/2006  FINDINGS: Lower chest: Small left pleural effusion is identified with overlying atelectasis/ consolidation. Mild pleural thickening is identified overlying a small right effusion.  Hepatobiliary: Calcified granulomas identified within the liver and spleen. There is a stone within the gallbladder measuring 7 mm, image 33/series 2.  Pancreas: Neck  Spleen: Splenic granulomas noted.  Adrenals/Urinary Tract: Normal  appearance of the adrenal glands. Bilateral hydronephrosis and hydroureter is again noted. There is thickening involving the wall of the right renal collecting system and right ureter. Scarring and cortical volume loss involving the right kidney is noted compatible with chronic pyelonephritis. Gas is identified within the left renal pelvis and left ureter. There is also thickening of the wall of the left renal collecting system and  ureter. Urinary bladder is partially collapsed around a suprapubic catheter.  Stomach/Bowel: Moderate distension of the urinary bladder. The small bowel loops have a normal caliber without evidence for bowel obstruction. The colon has a normal caliber without evidence for obstruction.  Vascular/Lymphatic: Calcified atherosclerotic disease involves the abdominal aorta. No aneurysm. Enlarged retroperitoneal lymph nodes identified. Index periaortic node measures 1 cm, image 28/series 2. This is compared with 1.2 cm previously. Index periaortic lymph node measures 9 mm, image 35/series 2. Previously this measured the same.  Reproductive: Prostate gland enlargement is noted.  Other: No free fluid or fluid collections within the abdomen or pelvis.  Musculoskeletal: There is a large destructive lytic lesion involving the right iliac bone and gluteal musculature. This measures 7.3 x 4.8 cm lytic lesion involving the right pubic symphysis measures 3.4 by 4.3 cm. Destructive bone lesion involving the T11 vertebra measures 3.1 x 2.8 cm. There is tumor extending into the canal and has mass effect upon the cord, image 6 of series 2.  IMPRESSION: 1. Findings are consistent with the clinical history of chronic pyelonephritis and bilateral hydronephrosis. 2. Multi focal lytic bone metastasis. There is a lytic lesion involving the T11 vertebra which appears to extend into the canal and exhibits mass effect upon the spinal cord. 3. Metastatic adenopathy noted within the upper abdomen. 4. Aortic atherosclerosis. 5. Gallstone 6. Small bilateral pleural effusions left greater than right. These results will be called to the ordering clinician or representative by the Radiologist Assistant, and communication documented in the PACS or zVision Dashboard.   Electronically Signed   By: Kerby Moors M.D.   On: 07/14/2014 12:01   Ct Chest Wo Contrast  07/14/2014   CLINICAL DATA:  Shortness of breath. Metastatic colon cancer and acute renal  failure.  EXAM: CT CHEST WITHOUT CONTRAST  TECHNIQUE: Multidetector CT imaging of the chest was performed following the standard protocol without IV contrast.  COMPARISON:  None.  FINDINGS: THORACIC INLET/BODY WALL:  No acute abnormality.  MEDIASTINUM:  Normal heart size. No pericardial effusion. No acute vascular abnormality. Calcified mediastinal lymph nodes compatible with remote granulomatous disease. No noncalcified adenopathy.  LUNG WINDOWS:  Volume loss and opacity in the left lower lobe with small pleural effusion. No associated pneumonia. No edema or pneumothorax. The lungs are hyperinflated and there is centrilobular emphysema. There is a 6 mm sub solid nodule in the left upper lobe on image 14. Smaller nodule present in the left upper lobe on image 18. These could be incidental or related to early pulmonary metastatic spread. Calcification in the left posterior costophrenic sulcus is likely a pulmonary granuloma, present by chest x-ray over multiple years.  UPPER ABDOMEN:  Known hydronephrosis bilaterally, with gas in the left urinary collecting system. Granulomatous changes present in the spleen and liver. Cholelithiasis. These findings were reported on CT from earlier the same day.  OSSEOUS:  Multiple lytic metastatic lesions, mainly noted in the spine and ribs. The most notable lesion is in the T11 vertebra, involving the right body and pedicle with growth into the spinal canal and probable mass effect on  the thecal sac. This was reported on the previous study.  IMPRESSION: 1. Small left pleural effusion with mild atelectasis. 2. Emphysema. 3. Multi focal osseous metastatic disease with T11 tumor infiltrating the spinal canal and deforming/involving the thecal sac. 4. Two subcentimeter pulmonary nodules.   Electronically Signed   By: Monte Fantasia M.D.   On: 07/14/2014 16:41   Dg Chest Port 1 View  07/10/2014   CLINICAL DATA:  Weakness.  Urinary incontinence  EXAM: PORTABLE CHEST - 1 VIEW   COMPARISON:  10/06/2013  FINDINGS: Hyperinflation and possible emphysematous change. There are bilateral nipple shadows which are symmetric. There is no edema, consolidation, effusion, or pneumothorax. Normal heart size and aortic contours.  IMPRESSION: COPD without acute superimposed disease.   Electronically Signed   By: Monte Fantasia M.D.   On: 07/10/2014 14:02   Dg Hip Unilat With Pelvis 2-3 Views Right  07/12/2014   CLINICAL DATA:  Right lateral hip pain x2 weeks, no no known injury  EXAM: RIGHT HIP (WITH PELVIS) 2-3 VIEWS  COMPARISON:  None.  FINDINGS: No fracture or dislocation is seen.  Mild degenerative changes of the bilateral hips.  Visualized bony pelvis appears intact.  IMPRESSION: No fracture or dislocation is seen.  Mild degenerative changes of the bilateral hips.   Electronically Signed   By: Julian Hy M.D.   On: 07/12/2014 11:42   Dg Hips Bilat With Pelvis 3-4 Views  07/13/2014   CLINICAL DATA:  Right hip pain.  No known injury.  EXAM: BILATERAL HIP (WITH PELVIS) 3-4 VIEWS  COMPARISON:  07/12/2014  FINDINGS: There is a destructive lesion in the lateral aspect of the right pubic body. There is suggestion of a lytic lesion in the right ilium adjacent to the inferior aspect of the right sacroiliac joint. On the lateral view of the right hip there is a 1 cm lucent area in the proximal right femoral shaft which may be a lytic lesion. There is slight lucency at the superior lateral aspect of right acetabulum which could represent a lytic lesion.  IMPRESSION: Lytic lesions of the right pubic body and right ilium. This could represent metastatic disease. 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   Electronically Signed   By: Lorriane Shire M.D.   On: 07/13/2014 15:53    Labs:  CBC:  Recent Labs  07/10/14 1257 07/11/14 0400 07/13/14 0530 07/14/14 0540  WBC 12.6* 9.0 9.2 8.9  HGB 10.2* 8.0* 7.2* 7.8*  HCT 33.6* 26.5* 24.7* 26.0*  PLT 461* 362 339 342     COAGS: No results for input(s): INR, APTT in the last 8760 hours.  BMP:  Recent Labs  07/11/14 0400 07/12/14 0345 07/13/14 0530 07/14/14 0540  NA 139 141 136 136  K 4.9 2.9* 3.6 3.6  CL 109 96 93* 98  CO2 22 35* 33* 30  GLUCOSE 85 120* 76 74  BUN 84* 73* 66* 63*  CALCIUM 10.9* 9.9 9.4 9.4  CREATININE 4.95* 4.62* 4.01* 3.84*  GFRNONAA 10* 11* 13* 14*  GFRAA 12* 13* 16* 16*    LIVER FUNCTION TESTS:  Recent Labs  10/06/13 2143  07/10/14 1257 07/11/14 0400 07/12/14 0345 07/13/14 0530  BILITOT 0.2*  --  0.7 0.4  --   --   AST 25  --  18 33  --   --   ALT 26  --  13 16  --   --   ALKPHOS 118*  --  69 53  --   --  PROT 10.1*  --  8.9* 6.5  --   --   ALBUMIN 2.9*  < > 2.8* 2.0* 1.8* 1.7*  < > = values in this interval not displayed.  Assessment and Plan: History of Adenocarcinoma s/p hemicolectomy 2006 UTI Right hip pain, Multifocal lytic lesions Request for biopsy  Scheduled for Monday CT guided right iliac lytic bone lesion with moderate sedation The patient will be NPO midnight before procedure, sq heparin held, labs reviewed and INR ordered Risks and Benefits discussed with the patient including, but not limited to bleeding, infection, damage to adjacent structures or low yield requiring additional tests. All of the patient's questions were answered, patient is agreeable to proceed. Consent signed and in chart.    Thank you for this interesting consult.  I greatly enjoyed meeting [employer] and look forward to participating in their care.  SignedHedy Jacob 07/15/2014, 8:58 AM   I spent a total of 20 Minutes in face to face in clinical consultation, greater than 50% of which was counseling/coordinating care for right iliac lytic lesion.

## 2014-07-15 NOTE — Progress Notes (Signed)
[facility] PROGRESS NOTE  [name] UMP:[id number] DOB: 1940/10/27 DOA: 07/10/2014 PCP: Dorcas Mcmurray, MD   74 y/o ? 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 Cath  Admit 07/10/14 c weakness, Leg pain and found to be hypoglycemic with  frank PUS draining from suprapubic catheter For worsening kidney injury, Nephrology consulted It was felt that Renal failure was 2/2 to catheter clogging and as such, it was felt that the ESBL in the urine might be a contaminant therefore antibiotics were narrowed to Bactrim DS Because of boney pain in his R hip with a bruise, He had a hip xray 4/28 This suggested Metastatic lesion to that area-CT scan confirmed large Ishial met + pubic Met + Vertebral met at t11 with cord compression Oncology consulted- Rad-Onc consulted Patient started on decadron IR will be able to do BM biopsy on 07/17/14   Assessment/Plan:  Metastatic Cancer unknown primary-DDx Myleoma vs Prostate CT chest shows ? Lung mets  Note had 3 benign Colon polyps on rpt colo 7/302008-never followed up Dr Amedeo Plenty of GI PSA pending Kappa/Lambda pending Bone M biopsy scheduled 5/2 See above  Sepsis due to UTI  From ESBL [facility] Patient presented with UTI, had pus draining from the suprapubic catheter. UA and culture obtained. Patient started on cefepime-->Carbapenem 4/27 Because this was considered to be a potential contaminant with catheter clogging, we changed patient over to Fosphomycin x 1 Suprapubic catheter was changed in the ED.  Acute kidney injury on CKD stage IV  Patient presented with acute kidney injury on CK D.  At the time of admission BUN/creatinine was 100/5.88--->63/3.84 Appreciate input from nephrology who have signed off Improving trend  ?periph neuropathy -unlikely given large boney mets-d/c gabapentin 4/00  Metabolic acidosis Patient presented with  metabolic acidosis likely from RTA/acute kidney injury Patient started on bicarbonate infusion CO2 is 22-->35-->33  to Saline 75 cc/hr  to [place] 4/30 Allow full diet Nephrology following  Hypercalcemia of malignancy Patient presented  with calcium 13.5, with albumin of 2.8, the corrected calcium is 14.5.  currently improved  Hyperkalemia + iatrogenic hypokalemia Patient came with potassium of 5.9, was given 1 dose of Kayexalate 30 g by mouth 1. This morning the potassium is 2.9 Likely 2/2 to ion shifting from Bicarb  K  Anemia AoFe deficiency Consider Hemoccults and OP management Hemoglobin continues to drop below 8 Rpt CBC 5/1 and transfuse if Blood count below 7.0  DVT prophylaxis Heparin  Code Status: Full code Family Communication: No family at bedside--will attempt to call family in am Disposition Plan: potenttial d/c home am if Hip xray as well as CBC wnl without leukocytosis   Consultants:  Nephrology  Procedures:   suprapubic catheter replaced in the ED  Antibiotics:  Cefepime 07/10/14  HPI/Subjective:  Depressed ++ Knows that he has a life-limiting illness.   Objective: Filed Vitals:   07/15/14 1453  BP: 112/68  Pulse: 86  Temp: 97.5 F (36.4 C)  Resp: 16    Intake/Output Summary (Last 24 hours) at 07/15/14 1657 Last data filed at 07/15/14 0949  Gross per 24 hour  Intake    360 ml  Output    950 ml  Net   -590 ml   Filed Weights   07/10/14 1331 07/12/14 0500  Weight: 52.164 kg (115 lb) 55 kg (121 lb 4.1 oz)    Exam:   General:  Appears in  no acute distress, frail and cachectic  Cardiovascular:  S1-S2 is regular  Respiratory:  clear to auscultation bilaterally  Abdomen:  soft, nontender, no organomegaly, suprapubic catheter in place  Musculoskeletal:  no edema noted in the lower extremities-excoriations to the LE's. Hyperreflexic  Data Reviewed: Basic Metabolic Panel:  Recent Labs Lab 07/10/14 1257 07/10/14 1425  07/11/14 0400 07/12/14 0345 07/13/14 0530 07/14/14 0540  NA 138  --  139 141 136 136  K 5.9*  --  4.9 2.9* 3.6 3.6  CL 106  --  109 96 93* 98  CO2 15*  --  22 35* 33* 30  GLUCOSE 52*  --  85 120* 76 74  BUN 100*  --  84* 73* 66* 63*  CREATININE 5.88*  --  4.95* 4.62* 4.01* 3.84*  CALCIUM 13.5*  --  10.9* 9.9 9.4 9.4  PHOS  --  3.8  --  2.7 1.6*  --    Liver Function Tests:  Recent Labs Lab 07/10/14 1257 07/11/14 0400 07/12/14 0345 07/13/14 0530  AST 18 33  --   --   ALT 13 16  --   --   ALKPHOS 69 53  --   --   BILITOT 0.7 0.4  --   --   PROT 8.9* 6.5  --   --   ALBUMIN 2.8* 2.0* 1.8* 1.7*   No results for input(s): LIPASE, AMYLASE in the last 168 hours. No results for input(s): AMMONIA in the last 168 hours. CBC:  Recent Labs Lab 07/10/14 1257 07/11/14 0400 07/13/14 0530 07/14/14 0540  WBC 12.6* 9.0 9.2 8.9  NEUTROABS 10.7*  --  6.2 5.6  HGB 10.2* 8.0* 7.2* 7.8*  HCT 33.6* 26.5* 24.7* 26.0*  MCV 72.4* 71.4* 71.4* 71.4*  PLT 461* 362 339 342   Cardiac Enzymes: No results for input(s): CKTOTAL, CKMB, CKMBINDEX, TROPONINI in the last 168 hours. BNP (last 3 results) No results for input(s): BNP in the last 8760 hours.  ProBNP (last 3 results)  Recent Labs  10/06/13 2143  PROBNP 1216.0*    CBG:  Recent Labs Lab 07/11/14 0638 07/11/14 0735 07/11/14 1003 07/11/14 1229 07/11/14 1704  GLUCAP 104* 95 98 113* 113*    Recent Results (from the past 240 hour(s))  Blood culture (routine x 2)     Status: None (Preliminary result)   Collection Time: 07/10/14 12:58 PM  Result Value Ref Range Status   Specimen Description BLOOD LEFT ARM  Final   Special Requests BOTTLES DRAWN AEROBIC ONLY 10CC  Final   Culture   Final           BLOOD CULTURE RECEIVED NO GROWTH TO DATE CULTURE WILL BE HELD FOR 5 DAYS BEFORE ISSUING A FINAL NEGATIVE REPORT Performed at [facility]    Report Status PENDING  Incomplete  Blood culture (routine x 2)     Status: None  (Preliminary result)   Collection Time: 07/10/14  2:59 PM  Result Value Ref Range Status   Specimen Description BLOOD LEFT WRIST  Final   Special Requests BOTTLES DRAWN AEROBIC ONLY 3CC  Final   Culture   Final           BLOOD CULTURE RECEIVED NO GROWTH TO DATE CULTURE WILL BE HELD FOR 5 DAYS BEFORE ISSUING A FINAL NEGATIVE REPORT Performed at [facility]    Report Status PENDING  Incomplete  Urine culture     Status: None   Collection Time: 07/10/14  4:10 PM  Result Value Ref Range Status   Specimen Description URINE, RANDOM  Final   Special Requests NONE  Final   Colony Count   Final    >=100,000 COLONIES/ML Performed at [facility]    Culture   Final    ESCHERICHIA COLI Note: Confirmed Extended Spectrum Beta-Lactamase Producer (ESBL) Note: CRITICAL RESULT CALLED TO, READ BACK BY AND VERIFIED WITH: BETTY JONES AT 5:40 A.M. ON 07/13/2014 WARRB Performed at [facility]    Report Status 07/13/2014 FINAL  Final   Organism ID, Bacteria ESCHERICHIA COLI  Final      Susceptibility   Escherichia coli - MIC*    AMPICILLIN >=32 RESISTANT Resistant     CEFAZOLIN >=64 RESISTANT Resistant     CEFTRIAXONE >=64 RESISTANT Resistant     CIPROFLOXACIN >=4 RESISTANT Resistant     GENTAMICIN <=1 SENSITIVE Sensitive     LEVOFLOXACIN >=8 RESISTANT Resistant     NITROFURANTOIN 32 SENSITIVE Sensitive     TOBRAMYCIN <=1 SENSITIVE Sensitive     TRIMETH/SULFA <=20 SENSITIVE Sensitive     IMIPENEM <=0.25 SENSITIVE Sensitive     PIP/TAZO <=4 SENSITIVE Sensitive     * ESCHERICHIA COLI  MRSA PCR Screening     Status: None   Collection Time: 07/10/14  4:10 PM  Result Value Ref Range Status   MRSA by PCR NEGATIVE NEGATIVE Final    Comment:        The GeneXpert MRSA Assay (FDA approved for NASAL specimens only), is one component of a comprehensive MRSA colonization surveillance program. It is not intended to diagnose MRSA infection nor to guide or monitor treatment  for MRSA infections.   Clostridium Difficile by PCR     Status: None   Collection Time: 07/12/14  9:18 AM  Result Value Ref Range Status   C difficile by pcr NEGATIVE NEGATIVE Final     Studies: Ct Abdomen Pelvis Wo Contrast  07/14/2014   CLINICAL DATA:  Urinary tract infection.  Renal failure.  EXAM: CT ABDOMEN AND PELVIS WITHOUT CONTRAST  TECHNIQUE: Multidetector CT imaging of the abdomen and pelvis was performed following the standard protocol without IV contrast.  COMPARISON:  10/08/2006  FINDINGS: Lower chest: Small left pleural effusion is identified with overlying atelectasis/ consolidation. Mild pleural thickening is identified overlying a small right effusion.  Hepatobiliary: Calcified granulomas identified within the liver and spleen. There is a stone within the gallbladder measuring 7 mm, image 33/series 2.  Pancreas: Neck  Spleen: Splenic granulomas noted.  Adrenals/Urinary Tract: Normal appearance of the adrenal glands. Bilateral hydronephrosis and hydroureter is again noted. There is thickening involving the wall of the right renal collecting system and right ureter. Scarring and cortical volume loss involving the right kidney is noted compatible with chronic pyelonephritis. Gas is identified within the left renal pelvis and left ureter. There is also thickening of the wall of the left renal collecting system and ureter. Urinary bladder is partially collapsed around a suprapubic catheter.  Stomach/Bowel: Moderate distension of the urinary bladder. The small bowel loops have a normal caliber without evidence for bowel obstruction. The colon has a normal caliber without evidence for obstruction.  Vascular/Lymphatic: Calcified atherosclerotic disease involves the abdominal aorta. No aneurysm. Enlarged retroperitoneal lymph nodes identified. Index periaortic node measures 1 cm, image 28/series 2. This is compared with 1.2 cm previously. Index periaortic lymph node measures 9 mm, image 35/series  2. Previously this measured the same.  Reproductive: Prostate gland enlargement is noted.  Other: No free  fluid or fluid collections within the abdomen or pelvis.  Musculoskeletal: There is a large destructive lytic lesion involving the right iliac bone and gluteal musculature. This measures 7.3 x 4.8 cm lytic lesion involving the right pubic symphysis measures 3.4 by 4.3 cm. Destructive bone lesion involving the T11 vertebra measures 3.1 x 2.8 cm. There is tumor extending into the canal and has mass effect upon the cord, image 6 of series 2.  IMPRESSION: 1. Findings are consistent with the clinical history of chronic pyelonephritis and bilateral hydronephrosis. 2. Multi focal lytic bone metastasis. There is a lytic lesion involving the T11 vertebra which appears to extend into the canal and exhibits mass effect upon the spinal cord. 3. Metastatic adenopathy noted within the upper abdomen. 4. Aortic atherosclerosis. 5. Gallstone 6. Small bilateral pleural effusions left greater than right. These results will be called to the ordering clinician or representative by the Radiologist Assistant, and communication documented in the PACS or zVision Dashboard.   Electronically Signed   By: Signa Kell M.D.   On: 07/14/2014 12:01   Ct Chest Wo Contrast  07/14/2014   CLINICAL DATA:  Shortness of breath. Metastatic colon cancer and acute renal failure.  EXAM: CT CHEST WITHOUT CONTRAST  TECHNIQUE: Multidetector CT imaging of the chest was performed following the standard protocol without IV contrast.  COMPARISON:  None.  FINDINGS: THORACIC INLET/BODY WALL:  No acute abnormality.  MEDIASTINUM:  Normal heart size. No pericardial effusion. No acute vascular abnormality. Calcified mediastinal lymph nodes compatible with remote granulomatous disease. No noncalcified adenopathy.  LUNG WINDOWS:  Volume loss and opacity in the left lower lobe with small pleural effusion. No associated pneumonia. No edema or pneumothorax. The  lungs are hyperinflated and there is centrilobular emphysema. There is a 6 mm sub solid nodule in the left upper lobe on image 14. Smaller nodule present in the left upper lobe on image 18. These could be incidental or related to early pulmonary metastatic spread. Calcification in the left posterior costophrenic sulcus is likely a pulmonary granuloma, present by chest x-ray over multiple years.  UPPER ABDOMEN:  Known hydronephrosis bilaterally, with gas in the left urinary collecting system. Granulomatous changes present in the spleen and liver. Cholelithiasis. These findings were reported on CT from earlier the same day.  OSSEOUS:  Multiple lytic metastatic lesions, mainly noted in the spine and ribs. The most notable lesion is in the T11 vertebra, involving the right body and pedicle with growth into the spinal canal and probable mass effect on the thecal sac. This was reported on the previous study.  IMPRESSION: 1. Small left pleural effusion with mild atelectasis. 2. Emphysema. 3. Multi focal osseous metastatic disease with T11 tumor infiltrating the spinal canal and deforming/involving the thecal sac. 4. Two subcentimeter pulmonary nodules.   Electronically Signed   By: Marnee Spring M.D.   On: 07/14/2014 16:41    Scheduled Meds: . darbepoetin (ARANESP) injection - NON-DIALYSIS  100 mcg Subcutaneous Q Wed-1800  . dexamethasone  4 mg Oral Q12H  . feeding supplement (NEPRO CARB STEADY)  237 mL Oral TID BM  . heparin  5,000 Units Subcutaneous 3 times per day  . [START ON 07/18/2014] heparin  5,000 Units Subcutaneous 3 times per day   Continuous Infusions:    Active Problems:   Renal failure   Metabolic acidosis   UTI (lower urinary tract infection)   Sepsis   Lytic bone lesions on xray    Time spent: 25 min  Verneita Griffes, MD [facility] [phone]

## 2014-07-16 LAB — COMPREHENSIVE METABOLIC PANEL
ALBUMIN: 2 g/dL — AB (ref 3.5–5.0)
ALT: 32 U/L (ref 17–63)
AST: 47 U/L — ABNORMAL HIGH (ref 15–41)
Alkaline Phosphatase: 64 U/L (ref 38–126)
Anion gap: 8 (ref 5–15)
BUN: 55 mg/dL — ABNORMAL HIGH (ref 6–20)
CHLORIDE: 100 mmol/L — AB (ref 101–111)
CO2: 25 mmol/L (ref 22–32)
CREATININE: 3.14 mg/dL — AB (ref 0.61–1.24)
Calcium: 10.2 mg/dL (ref 8.9–10.3)
GFR calc Af Amer: 21 mL/min — ABNORMAL LOW (ref >60)
GFR calc non Af Amer: 18 mL/min — ABNORMAL LOW (ref >60)
Glucose, Bld: 117 mg/dL — ABNORMAL HIGH (ref 70–99)
Potassium: 4.1 mmol/L (ref 3.5–5.1)
Sodium: 133 mmol/L — ABNORMAL LOW (ref 135–145)
Total Bilirubin: 0.3 mg/dL (ref 0.3–1.2)
Total Protein: 6.8 g/dL (ref 6.5–8.1)

## 2014-07-16 LAB — CBC WITH DIFFERENTIAL/PLATELET
BASOS ABS: 0 10*3/uL (ref 0.0–0.1)
BASOS PCT: 0 % (ref 0–1)
EOS ABS: 0 10*3/uL (ref 0.0–0.7)
Eosinophils Relative: 0 % (ref 0–5)
HEMATOCRIT: 26.8 % — AB (ref 39.0–52.0)
HEMOGLOBIN: 8.3 g/dL — AB (ref 13.0–17.0)
LYMPHS PCT: 9 % — AB (ref 12–46)
Lymphs Abs: 1.2 10*3/uL (ref 0.7–4.0)
MCH: 21.8 pg — ABNORMAL LOW (ref 26.0–34.0)
MCHC: 31 g/dL (ref 30.0–36.0)
MCV: 70.5 fL — ABNORMAL LOW (ref 78.0–100.0)
Monocytes Absolute: 0.7 10*3/uL (ref 0.1–1.0)
Monocytes Relative: 5 % (ref 3–12)
Neutro Abs: 11.6 10*3/uL — ABNORMAL HIGH (ref 1.7–7.7)
Neutrophils Relative %: 86 % — ABNORMAL HIGH (ref 43–77)
Platelets: 352 10*3/uL (ref 150–400)
RBC: 3.8 MIL/uL — ABNORMAL LOW (ref 4.22–5.81)
RDW: 14.9 % (ref 11.5–15.5)
WBC: 13.5 10*3/uL — ABNORMAL HIGH (ref 4.0–10.5)

## 2014-07-16 LAB — CULTURE, BLOOD (ROUTINE X 2)
Culture: NO GROWTH
Culture: NO GROWTH

## 2014-07-16 LAB — PROTIME-INR
INR: 0.95 (ref 0.00–1.49)
PROTHROMBIN TIME: 12.7 s (ref 11.6–15.2)

## 2014-07-16 LAB — KAPPA/LAMBDA LIGHT CHAINS
Kappa free light chain: 163.91 mg/L — ABNORMAL HIGH (ref 3.30–19.40)
Kappa, lambda light chain ratio: 1.6 (ref 0.26–1.65)
Lambda free light chains: 102.54 mg/L — ABNORMAL HIGH (ref 5.71–26.30)

## 2014-07-16 MED ORDER — ZOLPIDEM TARTRATE 5 MG PO TABS
5.0000 mg | ORAL_TABLET | Freq: Every evening | ORAL | Status: DC | PRN
  Administered 2014-07-16 – 2014-07-19 (×3): 5 mg via ORAL
  Filled 2014-07-16 (×3): qty 1

## 2014-07-16 NOTE — Progress Notes (Signed)
[facility] PROGRESS NOTE  [name] KAJ:[id number] DOB: [birth year] DOA: 07/10/2014 PCP: Dorcas Mcmurray, MD   74 y/o ? 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 Cath  Admit 07/10/14 c weakness, Leg pain and found to be hypoglycemic with  frank PUS draining from suprapubic catheter For worsening kidney injury, Nephrology consulted It was felt that Renal failure was 2/2 to catheter clogging and as such, it was felt that the ESBL in the urine might be a contaminant therefore antibiotics were narrowed to Bactrim DS Because of boney pain in his R hip with a bruise, He had a hip xray 4/28 This suggested Metastatic lesion to that area-CT scan confirmed large Ishial met + pubic Met + Vertebral met at t11 with cord compression Oncology consulted- Rad-Onc consulted Patient started on decadron IR will be able to do BM biopsy on 07/17/14   Assessment/Plan:  Metastatic Cancer unknown primary-DDx Myleoma vs Prostate CT chest shows ? Lung mets  MRI thoracic couldn't be completed as patient anxious-re-schedule at [facility] with sedation 07/17/14 Note had 3 benign Colon polyps on rpt colo 7/302008-never followed up Dr Amedeo Plenty of GI PSA 0.45 Kappa/Lambda are elevated as 163/102 respectively Bone M biopsy scheduled 5/2 continue decadron and taper eventually as per Oncology-his Hip pain is significantly improved See above  Sepsis due to UTI  From ESBL [facility] Patient presented with UTI, had pus draining from the suprapubic catheter. UA and culture obtained. Patient started on cefepime-->Carbapenem 4/27 Because this was considered to be a potential contaminant with catheter clogging, we changed patient over to Fosphomycin x 1 Suprapubic catheter was changed in the ED. Leukocytosis likely also related to steroid use  Acute kidney injury on CKD stage IV  Patient presented with acute kidney injury on CK D.    At the time of admission BUN/creatinine was 100/5.88--->63/3.84-->55/3.14 Appreciate input from nephrology who have signed off Improving trend  ?periph neuropathy -unlikely given large boney mets-d/c gabapentin 0/35  Metabolic acidosis Patient presented with metabolic acidosis likely from RTA/acute kidney injury Patient started on bicarbonate infusion CO2 is 22-->35-->33  to Saline 75 cc/hr  to [place] 4/30 Allow full diet Nephrology following  Hypercalcemia of malignancy Patient presented  with calcium 13.5, with albumin of 2.8, the corrected calcium is 14.5.  currently improved  Hyperkalemia + iatrogenic hypokalemia Patient came with potassium of 5.9, was given 1 dose of Kayexalate 30 g by mouth 1. This morning the potassium is 2.9 Likely 2/2 to ion shifting from Bicarb  resolved  Anemia AoFe deficiency Consider Hemoccults and OP management Hemoglobin continues to drop below 8 Rpt CBC 5/1 and transfuse if Blood count below 7.0  DVT prophylaxis Heparin  Code Status: Full code Family Communication: No family at bedside Disposition Plan: inpatient pedning completion of work-up   Consultants:  Nephrology  Procedures:   suprapubic catheter replaced in the ED  Antibiotics:  Cefepime 07/10/14  HPI/Subjective:  Happy he is getting a regular diet concerned about being NPO Many questions about plan of care and all questions addressed with patient   Objective: Filed Vitals:   07/16/14 1515  BP: 116/52  Pulse: 79  Temp: 98.1 F (36.7 C)  Resp: 18    Intake/Output Summary (Last 24 hours) at 07/16/14 1651 Last data filed at 07/16/14 1445  Gross per 24 hour  Intake    480 ml  Output   1375 ml  Net   -  895 ml   Filed Weights   07/10/14 1331 07/12/14 0500  Weight: 52.164 kg (115 lb) 55 kg (121 lb 4.1 oz)    Exam:   General:  Appears in no acute distress, frail and cachectic  Cardiovascular:  S1-S2 is regular  Respiratory:  clear to auscultation  bilaterally  Abdomen:  soft, nontender, no organomegaly, suprapubic catheter in place   Data Reviewed: Basic Metabolic Panel:  Recent Labs Lab 07/10/14 1425 07/11/14 0400 07/12/14 0345 07/13/14 0530 07/14/14 0540 07/16/14 0521  NA  --  139 141 136 136 133*  K  --  4.9 2.9* 3.6 3.6 4.1  CL  --  109 96 93* 98 100*  CO2  --  22 35* 33* 30 25  GLUCOSE  --  85 120* 76 74 117*  BUN  --  84* 73* 66* 63* 55*  CREATININE  --  4.95* 4.62* 4.01* 3.84* 3.14*  CALCIUM  --  10.9* 9.9 9.4 9.4 10.2  PHOS 3.8  --  2.7 1.6*  --   --    Liver Function Tests:  Recent Labs Lab 07/10/14 1257 07/11/14 0400 07/12/14 0345 07/13/14 0530 07/16/14 0521  AST 18 33  --   --  47*  ALT 13 16  --   --  32  ALKPHOS 69 53  --   --  64  BILITOT 0.7 0.4  --   --  0.3  PROT 8.9* 6.5  --   --  6.8  ALBUMIN 2.8* 2.0* 1.8* 1.7* 2.0*   No results for input(s): LIPASE, AMYLASE in the last 168 hours. No results for input(s): AMMONIA in the last 168 hours. CBC:  Recent Labs Lab 07/10/14 1257 07/11/14 0400 07/13/14 0530 07/14/14 0540 07/16/14 0521  WBC 12.6* 9.0 9.2 8.9 13.5*  NEUTROABS 10.7*  --  6.2 5.6 11.6*  HGB 10.2* 8.0* 7.2* 7.8* 8.3*  HCT 33.6* 26.5* 24.7* 26.0* 26.8*  MCV 72.4* 71.4* 71.4* 71.4* 70.5*  PLT 461* 362 339 342 352   Cardiac Enzymes: No results for input(s): CKTOTAL, CKMB, CKMBINDEX, TROPONINI in the last 168 hours. BNP (last 3 results) No results for input(s): BNP in the last 8760 hours.  ProBNP (last 3 results)  Recent Labs  10/06/13 2143  PROBNP 1216.0*    CBG:  Recent Labs Lab 07/11/14 0638 07/11/14 0735 07/11/14 1003 07/11/14 1229 07/11/14 1704  GLUCAP 104* 95 98 113* 113*    Recent Results (from the past 240 hour(s))  Blood culture (routine x 2)     Status: None   Collection Time: 07/10/14 12:58 PM  Result Value Ref Range Status   Specimen Description BLOOD LEFT ARM  Final   Special Requests BOTTLES DRAWN AEROBIC ONLY 10CC  Final   Culture    Final    NO GROWTH 5 DAYS Performed at [facility]    Report Status 07/16/2014 FINAL  Final  Blood culture (routine x 2)     Status: None   Collection Time: 07/10/14  2:59 PM  Result Value Ref Range Status   Specimen Description BLOOD LEFT WRIST  Final   Special Requests BOTTLES DRAWN AEROBIC ONLY 3CC  Final   Culture   Final    NO GROWTH 5 DAYS Performed at [facility]    Report Status 07/16/2014 FINAL  Final  Urine culture     Status: None   Collection Time: 07/10/14  4:10 PM  Result Value Ref Range Status   Specimen Description URINE, RANDOM  Final   Special Requests NONE  Final   Colony Count   Final    >=100,000 COLONIES/ML Performed at [facility]    Culture   Final    ESCHERICHIA COLI Note: Confirmed Extended Spectrum Beta-Lactamase Producer (ESBL) Note: CRITICAL RESULT CALLED TO, READ BACK BY AND VERIFIED WITH: BETTY JONES AT 5:40 A.M. ON 07/13/2014 WARRB Performed at [facility]    Report Status 07/13/2014 FINAL  Final   Organism ID, Bacteria ESCHERICHIA COLI  Final      Susceptibility   Escherichia coli - MIC*    AMPICILLIN >=32 RESISTANT Resistant     CEFAZOLIN >=64 RESISTANT Resistant     CEFTRIAXONE >=64 RESISTANT Resistant     CIPROFLOXACIN >=4 RESISTANT Resistant     GENTAMICIN <=1 SENSITIVE Sensitive     LEVOFLOXACIN >=8 RESISTANT Resistant     NITROFURANTOIN 32 SENSITIVE Sensitive     TOBRAMYCIN <=1 SENSITIVE Sensitive     TRIMETH/SULFA <=20 SENSITIVE Sensitive     IMIPENEM <=0.25 SENSITIVE Sensitive     PIP/TAZO <=4 SENSITIVE Sensitive     * ESCHERICHIA COLI  MRSA PCR Screening     Status: None   Collection Time: 07/10/14  4:10 PM  Result Value Ref Range Status   MRSA by PCR NEGATIVE NEGATIVE Final    Comment:        The GeneXpert MRSA Assay (FDA approved for NASAL specimens only), is one component of a comprehensive MRSA colonization surveillance program. It is not intended to diagnose MRSA infection nor to  guide or monitor treatment for MRSA infections.   Clostridium Difficile by PCR     Status: None   Collection Time: 07/12/14  9:18 AM  Result Value Ref Range Status   C difficile by pcr NEGATIVE NEGATIVE Final     Studies: Mr Lumbar Spine Wo Contrast  07/15/2014   CLINICAL DATA:  Colon carcinoma treated with hemicolectomy. Recent discovery of multiple lytic bone metastasis.  EXAM: MRI LUMBAR SPINE WITHOUT CONTRAST  TECHNIQUE: Multiplanar, multisequence MR imaging of the lumbar spine was performed. No intravenous contrast was administered.  COMPARISON:  CT 07/14/2014  FINDINGS: The scan covers the region from mid T10 through mid S3. The lower portion of the T10 vertebral body shows a metastasis within the posterior central vertebral body, possibly with early epidural extension to the left of midline.  At T11, there is more extensive metastatic disease within the vertebral body extending into the right pedicle. There is some extraosseous extension, encroaching upon the epidural space but not compressing the cord, which is surrounded by ample subarachnoid fluid. Some tumor encroachment upon the intervertebral foramen on the right at T10-11 and on the right at T11-12 is noted.  No lesions seen at T12, L1, L2, L3 or L4.  At L5, there is metastasis within the right transverse process. No compromise of the neural spaces. No metastasis seen in the upper sacrum. There is a large lesion of the right iliac bone, not completely imaged, measuring up to 7.3 cm in diameter, with extraosseous extension superficially.  There are changes of mild degenerative disc disease with insignificant disc bulges at L3-4, L4-5 and L5-S1. No neural compression. Chronic endplate marrow changes are present in the lower lumbar spine.  IMPRESSION: Metastatic disease affecting the right side of the T11 vertebral body extending into the right pedicle. Early epidural extension of tumor, without cord compression. Early encroachment upon the  intervertebral foramen on the right at T10-11 and T11-12.  Metastatic disease  seen within the inferior aspect of the T10 vertebral body. That level was not completely image to. Metastatic disease within the right transverse process of L5. Large metastatic lesion within the right iliac bone.   Electronically Signed   By: Nelson Chimes M.D.   On: 07/15/2014 17:55    Scheduled Meds: . darbepoetin (ARANESP) injection - NON-DIALYSIS  100 mcg Subcutaneous Q Wed-1800  . dexamethasone  4 mg Oral Q12H  . feeding supplement (NEPRO CARB STEADY)  237 mL Oral TID BM  . heparin  5,000 Units Subcutaneous 3 times per day  . [START ON 07/18/2014] heparin  5,000 Units Subcutaneous 3 times per day   Continuous Infusions:    Active Problems:   Renal failure   Metabolic acidosis   UTI (lower urinary tract infection)   Sepsis   Lytic bone lesions on xray    Time spent: 25 min   Verneita Griffes, MD [facility] [phone]

## 2014-07-16 NOTE — Progress Notes (Signed)
Writer called [facility] to setup sedation MRI -- unable to do on Sunday. Please call [phone] in morning of 07/17/14 to setup appointment for tomorrow.  Pt will be NPO after midnight tonight.

## 2014-07-17 ENCOUNTER — Inpatient Hospital Stay (HOSPITAL_COMMUNITY): Payer: Medicare Other

## 2014-07-17 ENCOUNTER — Encounter (HOSPITAL_COMMUNITY): Payer: Self-pay | Admitting: Radiology

## 2014-07-17 LAB — COMPREHENSIVE METABOLIC PANEL
ALT: 53 U/L (ref 17–63)
AST: 58 U/L — ABNORMAL HIGH (ref 15–41)
Albumin: 2.1 g/dL — ABNORMAL LOW (ref 3.5–5.0)
Alkaline Phosphatase: 59 U/L (ref 38–126)
Anion gap: 7 (ref 5–15)
BUN: 55 mg/dL — AB (ref 6–20)
CALCIUM: 10.4 mg/dL — AB (ref 8.9–10.3)
CO2: 25 mmol/L (ref 22–32)
Chloride: 100 mmol/L — ABNORMAL LOW (ref 101–111)
Creatinine, Ser: 3.08 mg/dL — ABNORMAL HIGH (ref 0.61–1.24)
GFR, EST AFRICAN AMERICAN: 21 mL/min — AB (ref >60)
GFR, EST NON AFRICAN AMERICAN: 18 mL/min — AB (ref >60)
GLUCOSE: 109 mg/dL — AB (ref 70–99)
Potassium: 4.6 mmol/L (ref 3.5–5.1)
Sodium: 132 mmol/L — ABNORMAL LOW (ref 135–145)
TOTAL PROTEIN: 6.7 g/dL (ref 6.5–8.1)
Total Bilirubin: 0.3 mg/dL (ref 0.3–1.2)

## 2014-07-17 LAB — CBC WITH DIFFERENTIAL/PLATELET
BASOS ABS: 0 10*3/uL (ref 0.0–0.1)
Basophils Relative: 0 % (ref 0–1)
EOS ABS: 0 10*3/uL (ref 0.0–0.7)
Eosinophils Relative: 0 % (ref 0–5)
HEMATOCRIT: 27.4 % — AB (ref 39.0–52.0)
Hemoglobin: 7.9 g/dL — ABNORMAL LOW (ref 13.0–17.0)
Lymphocytes Relative: 10 % — ABNORMAL LOW (ref 12–46)
Lymphs Abs: 1.1 10*3/uL (ref 0.7–4.0)
MCH: 20.7 pg — ABNORMAL LOW (ref 26.0–34.0)
MCHC: 28.8 g/dL — ABNORMAL LOW (ref 30.0–36.0)
MCV: 71.7 fL — ABNORMAL LOW (ref 78.0–100.0)
MONO ABS: 0.4 10*3/uL (ref 0.1–1.0)
MONOS PCT: 4 % (ref 3–12)
NEUTROS ABS: 9.1 10*3/uL — AB (ref 1.7–7.7)
Neutrophils Relative %: 86 % — ABNORMAL HIGH (ref 43–77)
Platelets: 364 10*3/uL (ref 150–400)
RBC: 3.82 MIL/uL — ABNORMAL LOW (ref 4.22–5.81)
RDW: 15.2 % (ref 11.5–15.5)
WBC: 10.6 10*3/uL — ABNORMAL HIGH (ref 4.0–10.5)

## 2014-07-17 LAB — PROTEIN ELECTROPHORESIS, SERUM
A/G Ratio: 0.5 — ABNORMAL LOW (ref 0.7–2.0)
ALBUMIN ELP: 2 g/dL — AB (ref 3.2–5.6)
Alpha-1-Globulin: 0.3 g/dL (ref 0.1–0.4)
Alpha-2-Globulin: 0.8 g/dL (ref 0.4–1.2)
Beta Globulin: 0.9 g/dL (ref 0.6–1.3)
Gamma Globulin: 1.7 g/dL — ABNORMAL HIGH (ref 0.5–1.6)
Globulin, Total: 3.7 g/dL (ref 2.0–4.5)
TOTAL PROTEIN ELP: 5.7 g/dL — AB (ref 6.0–8.5)

## 2014-07-17 LAB — PROTIME-INR
INR: 0.97 (ref 0.00–1.49)
Prothrombin Time: 12.9 seconds (ref 11.6–15.2)

## 2014-07-17 MED ORDER — FENTANYL CITRATE (PF) 100 MCG/2ML IJ SOLN
INTRAMUSCULAR | Status: AC
  Filled 2014-07-17: qty 4

## 2014-07-17 MED ORDER — MIDAZOLAM HCL 2 MG/2ML IJ SOLN
INTRAMUSCULAR | Status: AC | PRN
  Administered 2014-07-17: 0.5 mg via INTRAVENOUS

## 2014-07-17 MED ORDER — MIDAZOLAM HCL 2 MG/2ML IJ SOLN
INTRAMUSCULAR | Status: AC
  Filled 2014-07-17: qty 6

## 2014-07-17 MED ORDER — FENTANYL CITRATE (PF) 100 MCG/2ML IJ SOLN
INTRAMUSCULAR | Status: AC | PRN
  Administered 2014-07-17: 25 ug via INTRAVENOUS

## 2014-07-17 NOTE — Progress Notes (Signed)
Attempted to call [facility] to set up MRI, placed on hold- no answer, will try again

## 2014-07-17 NOTE — Progress Notes (Signed)
PT Cancellation Note  Patient Details Name: [name] MRN: [id number] DOB: 1940-04-03   Cancelled Treatment:    Reason Eval/Treat Not Completed: Other (comment) (pt having iliac bx)   [name] 07/17/2014, 2:04 PM

## 2014-07-17 NOTE — Progress Notes (Signed)
[facility] PROGRESS NOTE  [name] HUT:[id number] DOB: 12/25/1940 DOA: 07/10/2014 PCP: Dorcas Mcmurray, MD   74 y/o ? 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 Cath  Admit 07/10/14 c weakness, Leg pain and found to be hypoglycemic with  frank PUS draining from suprapubic catheter For worsening kidney injury, Nephrology consulted It was felt that Renal failure was 2/2 to catheter clogging and as such, it was felt that the ESBL in the urine might be a contaminant therefore antibiotics were narrowed to Bactrim DS Because of boney pain in his R hip with a bruise, He had a hip xray 4/28 This suggested Metastatic lesion to that area-CT scan confirmed large Ishial met + pubic Met + Vertebral met at t11 with cord compression Oncology consulted Rad-Onc consulted Patient started on decadron which was d/c on 07/17/14 IR performed BM biopsy on 07/17/14 Work-up in process and pathology is pending   Assessment/Plan:  Metastatic Cancer unknown primary-DDx Myleoma vs Prostate CT chest shows ? Lung mets  MRI thoracic couldn't be completed as patient anxious-re-schedule at [facility] with sedation 07/18/14 Note had 3 benign Colon polyps on rpt colo 7/302008-never followed up Dr Amedeo Plenty of GI PSA 0.45 Kappa/Lambda are elevated as 163/102 respectively Bone M biopsy performed 5/2 with result pending Oncology d/c his decadron 5/2-his Hip pain is significantly improved  Sepsis due to UTI  From ESBL [facility] Patient presented with UTI, had pus draining from the suprapubic catheter. UA and culture obtained. Patient started on cefepime-->Carbapenem 4/27 Because this was considered to be a potential contaminant with catheter clogging, we changed patient over to Fosphomycin x 1 Suprapubic catheter was changed in the ED. Leukocytosis likely also related to steroid use  Acute kidney injury on CKD stage IV  Patient  presented with acute kidney injury on CKD.  At the time of admission BUN/creatinine was 100/5.88--->63/3.84-->55/3.14-- Appreciate input from nephrology who have signed off Improving trend  ?periph neuropathy -unlikely given large boney mets-d/c gabapentin 6/56  Metabolic acidosis Patient presented with metabolic acidosis likely from RTA/acute kidney injury Patient started on bicarbonate infusion CO2 is 22-->35-->33  to Saline 75 cc/hr  to Terry 4/30 Allow full diet Nephrology following  Hypercalcemia of malignancy Patient presented  with calcium 13.5, with albumin of 2.8, the corrected calcium is 14.5.  Current calcium level is 10.4-Corrected=11.9  Hyperkalemia + iatrogenic hypokalemia Patient came with potassium of 5.9, was given 1 dose of Kayexalate 30 g by mouth 1. This morning the potassium is 2.9 Likely 2/2 to ion shifting from Bicarb resolved  Anemia AoFe deficiency Consider Hemoccults and OP management Hemoglobin continues to drop below 8 Rpt CBC 5/1 and transfuse if Blood count below 7.0  DVT prophylaxis Heparin  Code Status: Full code Family Communication: No family at bedside Disposition Plan: inpatient pedning completion of work-up   Consultants:  Nephrology  oncology  Procedures:   suprapubic catheter replaced in the ED  Antibiotics:  Cefepime 07/10/14  HPI/Subjective:  Just back from Biopsy Hungry and wants to eat Wants his feet clean No cp,n,v,sob No blurred or double vision  Objective: Filed Vitals:   07/17/14 1022  BP: 127/76  Pulse: 85  Temp:   Resp: 17    Intake/Output Summary (Last 24 hours) at 07/17/14 1054 Last data filed at 07/17/14 0520  Gross per 24 hour  Intake    240 ml  Output   1150 ml  Net   -910 ml   Filed Weights   07/10/14 1331 07/12/14 0500  Weight: 52.164 kg (115 lb) 55 kg (121 lb 4.1 oz)    Exam:   General:  Appears in no acute distress, frail and cachectic  Cardiovascular:  S1-S2 is  regular  Respiratory:  clear to auscultation bilaterally  Abdomen:  soft, nontender, no organomegaly, suprapubic catheter in place   Data Reviewed: Basic Metabolic Panel:  Recent Labs Lab 07/10/14 1425  07/12/14 0345 07/13/14 0530 07/14/14 0540 07/16/14 0521 07/17/14 0520  NA  --   < > 141 136 136 133* 132*  K  --   < > 2.9* 3.6 3.6 4.1 4.6  CL  --   < > 96 93* 98 100* 100*  CO2  --   < > 35* 33* $Remov'30 25 25  'UenIZh$ GLUCOSE  --   < > 120* 76 74 117* 109*  BUN  --   < > 73* 66* 63* 55* 55*  CREATININE  --   < > 4.62* 4.01* 3.84* 3.14* 3.08*  CALCIUM  --   < > 9.9 9.4 9.4 10.2 10.4*  PHOS 3.8  --  2.7 1.6*  --   --   --   < > = values in this interval not displayed. Liver Function Tests:  Recent Labs Lab 07/10/14 1257 07/11/14 0400 07/12/14 0345 07/13/14 0530 07/16/14 0521 07/17/14 0520  AST 18 33  --   --  47* 58*  ALT 13 16  --   --  32 53  ALKPHOS 69 53  --   --  64 59  BILITOT 0.7 0.4  --   --  0.3 0.3  PROT 8.9* 6.5  --   --  6.8 6.7  ALBUMIN 2.8* 2.0* 1.8* 1.7* 2.0* 2.1*   No results for input(s): LIPASE, AMYLASE in the last 168 hours. No results for input(s): AMMONIA in the last 168 hours. CBC:  Recent Labs Lab 07/10/14 1257 07/11/14 0400 07/13/14 0530 07/14/14 0540 07/16/14 0521 07/17/14 0520  WBC 12.6* 9.0 9.2 8.9 13.5* 10.6*  NEUTROABS 10.7*  --  6.2 5.6 11.6* 9.1*  HGB 10.2* 8.0* 7.2* 7.8* 8.3* 7.9*  HCT 33.6* 26.5* 24.7* 26.0* 26.8* 27.4*  MCV 72.4* 71.4* 71.4* 71.4* 70.5* 71.7*  PLT 461* 362 339 342 352 364   Cardiac Enzymes: No results for input(s): CKTOTAL, CKMB, CKMBINDEX, TROPONINI in the last 168 hours. BNP (last 3 results) No results for input(s): BNP in the last 8760 hours.  ProBNP (last 3 results)  Recent Labs  10/06/13 2143  PROBNP 1216.0*    CBG:  Recent Labs Lab 07/11/14 0638 07/11/14 0735 07/11/14 1003 07/11/14 1229 07/11/14 1704  GLUCAP 104* 95 98 113* 113*    Recent Results (from the past 240 hour(s))  Blood  culture (routine x 2)     Status: None   Collection Time: 07/10/14 12:58 PM  Result Value Ref Range Status   Specimen Description BLOOD LEFT ARM  Final   Special Requests BOTTLES DRAWN AEROBIC ONLY 10CC  Final   Culture   Final    NO GROWTH 5 DAYS Performed at [facility]    Report Status 07/16/2014 FINAL  Final  Blood culture (routine x 2)     Status: None   Collection Time: 07/10/14  2:59 PM  Result Value Ref Range Status   Specimen Description BLOOD LEFT WRIST  Final   Special Requests BOTTLES DRAWN AEROBIC ONLY 3CC  Final  Culture   Final    NO GROWTH 5 DAYS Performed at [facility]    Report Status 07/16/2014 FINAL  Final  Urine culture     Status: None   Collection Time: 07/10/14  4:10 PM  Result Value Ref Range Status   Specimen Description URINE, RANDOM  Final   Special Requests NONE  Final   Colony Count   Final    >=100,000 COLONIES/ML Performed at [facility]    Culture   Final    ESCHERICHIA COLI Note: Confirmed Extended Spectrum Beta-Lactamase Producer (ESBL) Note: CRITICAL RESULT CALLED TO, READ BACK BY AND VERIFIED WITH: BETTY JONES AT 5:40 A.M. ON 07/13/2014 WARRB Performed at [facility]    Report Status 07/13/2014 FINAL  Final   Organism ID, Bacteria ESCHERICHIA COLI  Final      Susceptibility   Escherichia coli - MIC*    AMPICILLIN >=32 RESISTANT Resistant     CEFAZOLIN >=64 RESISTANT Resistant     CEFTRIAXONE >=64 RESISTANT Resistant     CIPROFLOXACIN >=4 RESISTANT Resistant     GENTAMICIN <=1 SENSITIVE Sensitive     LEVOFLOXACIN >=8 RESISTANT Resistant     NITROFURANTOIN 32 SENSITIVE Sensitive     TOBRAMYCIN <=1 SENSITIVE Sensitive     TRIMETH/SULFA <=20 SENSITIVE Sensitive     IMIPENEM <=0.25 SENSITIVE Sensitive     PIP/TAZO <=4 SENSITIVE Sensitive     * ESCHERICHIA COLI  MRSA PCR Screening     Status: None   Collection Time: 07/10/14  4:10 PM  Result Value Ref Range Status   MRSA by PCR NEGATIVE NEGATIVE  Final    Comment:        The GeneXpert MRSA Assay (FDA approved for NASAL specimens only), is one component of a comprehensive MRSA colonization surveillance program. It is not intended to diagnose MRSA infection nor to guide or monitor treatment for MRSA infections.   Clostridium Difficile by PCR     Status: None   Collection Time: 07/12/14  9:18 AM  Result Value Ref Range Status   C difficile by pcr NEGATIVE NEGATIVE Final     Studies: Mr Lumbar Spine Wo Contrast  07/15/2014   CLINICAL DATA:  Colon carcinoma treated with hemicolectomy. Recent discovery of multiple lytic bone metastasis.  EXAM: MRI LUMBAR SPINE WITHOUT CONTRAST  TECHNIQUE: Multiplanar, multisequence MR imaging of the lumbar spine was performed. No intravenous contrast was administered.  COMPARISON:  CT 07/14/2014  FINDINGS: The scan covers the region from mid T10 through mid S3. The lower portion of the T10 vertebral body shows a metastasis within the posterior central vertebral body, possibly with early epidural extension to the left of midline.  At T11, there is more extensive metastatic disease within the vertebral body extending into the right pedicle. There is some extraosseous extension, encroaching upon the epidural space but not compressing the cord, which is surrounded by ample subarachnoid fluid. Some tumor encroachment upon the intervertebral foramen on the right at T10-11 and on the right at T11-12 is noted.  No lesions seen at T12, L1, L2, L3 or L4.  At L5, there is metastasis within the right transverse process. No compromise of the neural spaces. No metastasis seen in the upper sacrum. There is a large lesion of the right iliac bone, not completely imaged, measuring up to 7.3 cm in diameter, with extraosseous extension superficially.  There are changes of mild degenerative disc disease with insignificant disc bulges at L3-4, L4-5 and L5-S1. No neural  compression. Chronic endplate marrow changes are present in the  lower lumbar spine.  IMPRESSION: Metastatic disease affecting the right side of the T11 vertebral body extending into the right pedicle. Early epidural extension of tumor, without cord compression. Early encroachment upon the intervertebral foramen on the right at T10-11 and T11-12.  Metastatic disease seen within the inferior aspect of the T10 vertebral body. That level was not completely image to. Metastatic disease within the right transverse process of L5. Large metastatic lesion within the right iliac bone.   Electronically Signed   By: Nelson Chimes M.D.   On: 07/15/2014 17:55    Scheduled Meds: . darbepoetin (ARANESP) injection - NON-DIALYSIS  100 mcg Subcutaneous Q Wed-1800  . feeding supplement (NEPRO CARB STEADY)  237 mL Oral TID BM  . [START ON 07/18/2014] heparin  5,000 Units Subcutaneous 3 times per day   Continuous Infusions:    Active Problems:   Renal failure   Metabolic acidosis   UTI (lower urinary tract infection)   Sepsis   Lytic bone lesions on xray    Time spent: 25 min   Verneita Griffes, MD [facility] [phone]

## 2014-07-17 NOTE — Plan of Care (Signed)
Problem: Phase II Progression Outcomes Goal: Obtain order to discontinue catheter if appropriate Outcome: Not Applicable Date Met:  70/34/03 Pt has suprapubic cath

## 2014-07-17 NOTE — Progress Notes (Signed)
[facility] to call back with time

## 2014-07-17 NOTE — Plan of Care (Signed)
Problem: Phase III Progression Outcomes Goal: Voiding independently Outcome: Completed/Met Date Met:  07/17/14 Pt has suprapubic cath

## 2014-07-17 NOTE — Procedures (Signed)
CT core bx R iliac mass No complication No blood loss. See complete dictation in [facility].

## 2014-07-17 NOTE — Progress Notes (Signed)
IP PROGRESS NOTE  Subjective:   He reports the right "hip "discomfort is significantly improved.  Objective: Vital signs in last 24 hours: Blood pressure 115/60, pulse 73, temperature 97.6 F (36.4 C), temperature source Oral, resp. rate 20, height $RemoveBe'6\' 5"'vZwDZrnNZ$  (1.956 m), weight 121 lb 4.1 oz (55 kg), SpO2 100 %.  Intake/Output from previous day: 05/01 0701 - 05/02 0700 In: 480 [P.O.:480] Out: 1150 [Urine:1150]  Physical Exam:  Alert. Soft tissue fullness at the right iliac appears diminished in size Adequate strength with plantar flexion at the foot bilaterally   Lab Results:  Recent Labs  07/16/14 0521 07/17/14 0520  WBC 13.5* 10.6*  HGB 8.3* 7.9*  HCT 26.8* 27.4*  PLT 352 364    BMET  Recent Labs  07/16/14 0521 07/17/14 0520  NA 133* 132*  K 4.1 4.6  CL 100* 100*  CO2 25 25  GLUCOSE 117* 109*  BUN 55* 55*  CREATININE 3.14* 3.08*  CALCIUM 10.2 10.4*    Studies/Results: Mr Lumbar Spine Wo Contrast  07/15/2014   CLINICAL DATA:  Colon carcinoma treated with hemicolectomy. Recent discovery of multiple lytic bone metastasis.  EXAM: MRI LUMBAR SPINE WITHOUT CONTRAST  TECHNIQUE: Multiplanar, multisequence MR imaging of the lumbar spine was performed. No intravenous contrast was administered.  COMPARISON:  CT 07/14/2014  FINDINGS: The scan covers the region from mid T10 through mid S3. The lower portion of the T10 vertebral body shows a metastasis within the posterior central vertebral body, possibly with early epidural extension to the left of midline.  At T11, there is more extensive metastatic disease within the vertebral body extending into the right pedicle. There is some extraosseous extension, encroaching upon the epidural space but not compressing the cord, which is surrounded by ample subarachnoid fluid. Some tumor encroachment upon the intervertebral foramen on the right at T10-11 and on the right at T11-12 is noted.  No lesions seen at T12, L1, L2, L3 or L4.  At L5,  there is metastasis within the right transverse process. No compromise of the neural spaces. No metastasis seen in the upper sacrum. There is a large lesion of the right iliac bone, not completely imaged, measuring up to 7.3 cm in diameter, with extraosseous extension superficially.  There are changes of mild degenerative disc disease with insignificant disc bulges at L3-4, L4-5 and L5-S1. No neural compression. Chronic endplate marrow changes are present in the lower lumbar spine.  IMPRESSION: Metastatic disease affecting the right side of the T11 vertebral body extending into the right pedicle. Early epidural extension of tumor, without cord compression. Early encroachment upon the intervertebral foramen on the right at T10-11 and T11-12.  Metastatic disease seen within the inferior aspect of the T10 vertebral body. That level was not completely image to. Metastatic disease within the right transverse process of L5. Large metastatic lesion within the right iliac bone.   Electronically Signed   By: Nelson Chimes M.D.   On: 07/15/2014 17:55    Medications: I have reviewed the patient's current medications.  Assessment/Plan:  1. 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 Pain secondary to destructive lesion at the right iliac-improved with Decadron  3. Escherichia coli urinary tract infection/bacteremia  4. Chronic renal failure  5. Chronic microcytic anemia  6. History of stage I colon cancer December 2006  7. COPD  8. Urethral obstruction, status post placement of a suprapubic catheter  9. Hypercalcemia-improved  Mr. Hyson reports significant improvement in pain since being placed  on Decadron. 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 He is scheduled for a biopsy of the right iliac mass today. The differential diagnosis includes multiple myeloma, lymphoma, and metastatic carcinoma. He does not have cord compression on the lumbar MRI.  Recommendations: 1.  Discontinue Decadron 2. Proceed with biopsy of right iliac mass today 3. I will follow-up on the pathology and make treatment recommendations    LOS: 7 days   Adysen Raphael  07/17/2014, 8:31 AM

## 2014-07-18 ENCOUNTER — Encounter (HOSPITAL_COMMUNITY): Payer: Self-pay | Admitting: Anesthesiology

## 2014-07-18 ENCOUNTER — Encounter (HOSPITAL_COMMUNITY)
Admission: EM | Disposition: A | Discharge status: Hospice | Payer: Self-pay | Source: Home / Self Care | Attending: Family Medicine

## 2014-07-18 ENCOUNTER — Inpatient Hospital Stay (HOSPITAL_COMMUNITY): Payer: Medicare Other | Admitting: Anesthesiology

## 2014-07-18 ENCOUNTER — Ambulatory Visit (HOSPITAL_COMMUNITY)
Admission: RE | Admit: 2014-07-18 | Discharge: 2014-07-18 | Disposition: A | Discharge status: Home / Self Care | Payer: Medicare Other | Source: Ambulatory Visit | Attending: Family Medicine | Admitting: Family Medicine

## 2014-07-18 ENCOUNTER — Other Ambulatory Visit (HOSPITAL_COMMUNITY): Payer: Medicare Other

## 2014-07-18 DIAGNOSIS — C3410 Malignant neoplasm of upper lobe, unspecified bronchus or lung: Secondary | ICD-10-CM

## 2014-07-18 DIAGNOSIS — J90 Pleural effusion, not elsewhere classified: Secondary | ICD-10-CM

## 2014-07-18 DIAGNOSIS — C799 Secondary malignant neoplasm of unspecified site: Secondary | ICD-10-CM

## 2014-07-18 DIAGNOSIS — R52 Pain, unspecified: Secondary | ICD-10-CM

## 2014-07-18 DIAGNOSIS — Z85038 Personal history of other malignant neoplasm of large intestine: Secondary | ICD-10-CM

## 2014-07-18 DIAGNOSIS — C7931 Secondary malignant neoplasm of brain: Secondary | ICD-10-CM

## 2014-07-18 DIAGNOSIS — E860 Dehydration: Secondary | ICD-10-CM

## 2014-07-18 DIAGNOSIS — C189 Malignant neoplasm of colon, unspecified: Secondary | ICD-10-CM

## 2014-07-18 DIAGNOSIS — B9620 Unspecified Escherichia coli [E. coli] as the cause of diseases classified elsewhere: Secondary | ICD-10-CM

## 2014-07-18 HISTORY — PX: RADIOLOGY WITH ANESTHESIA: SHX6223

## 2014-07-18 LAB — CBC WITH DIFFERENTIAL/PLATELET
BASOS PCT: 0 % (ref 0–1)
Basophils Absolute: 0 10*3/uL (ref 0.0–0.1)
Eosinophils Absolute: 0 10*3/uL (ref 0.0–0.7)
Eosinophils Relative: 0 % (ref 0–5)
HCT: 27.5 % — ABNORMAL LOW (ref 39.0–52.0)
HEMOGLOBIN: 8.5 g/dL — AB (ref 13.0–17.0)
LYMPHS ABS: 2.1 10*3/uL (ref 0.7–4.0)
LYMPHS PCT: 19 % (ref 12–46)
MCH: 22.1 pg — ABNORMAL LOW (ref 26.0–34.0)
MCHC: 30.9 g/dL (ref 30.0–36.0)
MCV: 71.6 fL — ABNORMAL LOW (ref 78.0–100.0)
Monocytes Absolute: 1.6 10*3/uL — ABNORMAL HIGH (ref 0.1–1.0)
Monocytes Relative: 14 % — ABNORMAL HIGH (ref 3–12)
NEUTROS PCT: 67 % (ref 43–77)
Neutro Abs: 7.5 10*3/uL (ref 1.7–7.7)
Platelets: 362 10*3/uL (ref 150–400)
RBC: 3.84 MIL/uL — ABNORMAL LOW (ref 4.22–5.81)
RDW: 15.1 % (ref 11.5–15.5)
WBC: 11.2 10*3/uL — ABNORMAL HIGH (ref 4.0–10.5)

## 2014-07-18 SURGERY — RADIOLOGY WITH ANESTHESIA
Anesthesia: General

## 2014-07-18 MED ORDER — HEPARIN SODIUM (PORCINE) 5000 UNIT/ML IJ SOLN
5000.0000 [IU] | Freq: Three times a day (TID) | INTRAMUSCULAR | Status: DC
  Administered 2014-07-18 – 2014-07-20 (×6): 5000 [IU] via SUBCUTANEOUS
  Filled 2014-07-18 (×8): qty 1

## 2014-07-18 MED ORDER — HYDROCODONE-ACETAMINOPHEN 5-325 MG PO TABS: 1.0000 | ORAL_TABLET | ORAL | Status: DC | PRN

## 2014-07-18 MED ORDER — SODIUM CHLORIDE 0.9 % IV SOLN
60.0000 mg | Freq: Once | INTRAVENOUS | Status: AC
  Administered 2014-07-18: 60 mg via INTRAVENOUS
  Filled 2014-07-18: qty 20
  Filled 2014-07-18: qty 6.67

## 2014-07-18 MED ORDER — FENTANYL CITRATE (PF) 100 MCG/2ML IJ SOLN: 25.0000 ug | INTRAMUSCULAR | Status: DC | PRN

## 2014-07-18 MED ORDER — LACTATED RINGERS IV SOLN
INTRAVENOUS | Status: DC
  Administered 2014-07-18 (×2): via INTRAVENOUS

## 2014-07-18 MED ORDER — DEXAMETHASONE 4 MG PO TABS
4.0000 mg | ORAL_TABLET | Freq: Two times a day (BID) | ORAL | Status: DC
  Administered 2014-07-18 – 2014-07-20 (×4): 4 mg via ORAL
  Filled 2014-07-18 (×5): qty 1

## 2014-07-18 NOTE — Anesthesia Preprocedure Evaluation (Addendum)
Anesthesia Evaluation  Patient identified by MRN, date of birth, ID band Patient awake    Reviewed: Allergy & Precautions, NPO status , Patient's Chart, lab work & pertinent test results, reviewed documented beta blocker date and time   History of Anesthesia Complications Negative for: history of anesthetic complications  Airway Mallampati: II  TM Distance: >3 FB Neck ROM: Full    Dental  (+) Edentulous Upper, Edentulous Lower, Dental Advisory Given   Pulmonary shortness of breath, COPDCurrent Smoker,    + decreased breath sounds      Cardiovascular hypertension, - angina- CHF Rhythm:Regular     Neuro/Psych negative neurological ROS  negative psych ROS   GI/Hepatic Neg liver ROS, GERD-  Medicated and Controlled,  Endo/Other    Renal/GU Renal InsufficiencyRenal disease     Musculoskeletal   Abdominal   Peds  Hematology  (+) anemia ,   Anesthesia Other Findings MDRO Contact precautions maintained  Reproductive/Obstetrics                            Anesthesia Physical Anesthesia Plan  ASA: III  Anesthesia Plan: General   Post-op Pain Management:    Induction: Intravenous  Airway Management Planned: LMA  Additional Equipment: None  Intra-op Plan:   Post-operative Plan: Extubation in OR  Informed Consent: I have reviewed the patients History and Physical, chart, labs and discussed the procedure including the risks, benefits and alternatives for the proposed anesthesia with the patient or authorized representative who has indicated his/her understanding and acceptance.     Plan Discussed with: CRNA and Surgeon  Anesthesia Plan Comments:         Anesthesia Quick Evaluation

## 2014-07-18 NOTE — Progress Notes (Signed)
  Radiation Oncology         [phone] ________________________________  Name: [name]  MRN: [id number]  Date: 07/10/2014  DOB: [birth year]  Chart Note:  Recent events noted.  CT-biopsy report reviewed.  MRI reviewed.  Dr. Ashok Cordia preliminary pathology info was reviewed.  The patient may benefit from palliative radiotherapy to his right iliac metastasis and T-spine lesions.  Will work with rad-onc team to coordinate [place] on 07/19/14.  Depending on histology, may consider focal stereotactic treatment in abbreviated course.  ________________________________  Sheral Apley. Tammi Klippel, M.D.

## 2014-07-18 NOTE — Progress Notes (Addendum)
[facility] PROGRESS NOTE  [name] ZOX:[id number] DOB: 1940/12/01 DOA: 07/10/2014 PCP: No primary care provider on file.   74 y/o ? 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 Cath  Admit 07/10/14 c weakness, Leg pain and found to be hypoglycemic with  frank PUS draining from suprapubic catheter For worsening kidney injury, Nephrology consulted It was felt that Renal failure was 2/2 to catheter clogging and as such, it was felt that the ESBL in the urine might be a contaminant therefore antibiotics were narrowed to Bactrim DS Because of boney pain in his R hip with a bruise, He had a hip xray 4/28 This suggested Metastatic lesion to that area-CT scan confirmed large Ishial met + pubic Met + Vertebral met at t11 with cord compression Oncology consulted Rad-Onc consulted Patient started on decadron  IR performed BM biopsy on 07/17/14-preliminary result is Carcinoma   Assessment/Plan:  Metastatic Cancer unknown primary-DDx Myleoma vs Prostate CT chest shows ? Lung mets  MRI 5/3 thoracic spine confirms lung Met Note had 3 benign Colon polyps on rpt colo 7/302008-never followed up Dr Amedeo Plenty of GI PSA 0.45 Kappa/Lambda are elevated as 163/102 respectively Bone M biopsy performed 5/2 with final result pending Oncology deciding about decadron--might re-start Dr. Benay Spice will d/c Dr. Tammi Klippel of [place] regarding need for XRT to help c pain in R hip  Sepsis due to UTI  From ES[facility] Patient presented with UTI, had pus draining from the suprapubic catheter. UA and culture obtained. Patient started on cefepime-->Carbapenem 4/27 Because this was considered to be a potential contaminant with catheter clogging, we changed patient over to Fosphomycin x 1 Suprapubic catheter was changed in the ED. Leukocytosis likely also related to steroid use  Acute kidney injury on CKD stage IV    Patient presented with acute kidney injury on CKD.  At the time of admission BUN/creatinine was 100/5.88--->63/3.84-->55/3.14-- Appreciate input from nephrology who have signed off Improving trend  ?periph neuropathy -unlikely given large boney mets-d/c gabapentin 8/11  Metabolic acidosis Patient presented with metabolic acidosis likely from RTA/acute kidney injury Patient started on bicarbonate infusion CO2 is 22-->35-->33  to Saline 75 cc/hr  to [place] 4/30 Allow full diet regular diet at patient request Nephrology following  Hypercalcemia of malignancy Patient presented  with calcium 13.5, with albumin of 2.8, the corrected calcium is 14.5.  Current calcium level is 10.4-Corrected=11.9 Oncology deciding about Pamidronate  Hyperkalemia + iatrogenic hypokalemia Patient came with potassium of 5.9, was given 1 dose of Kayexalate 30 g by mouth 1.  Likely 2/2 to ion shifting from Bicarb resolved  Anemia +,leukocytosis AoFe deficiency + Malignancy Leukocytosis unlikely infectious related-could be 2/2 to steroids Consider Hemoccults and OP management Hemoglobin continues to drop below 8 Rpt CBC 5/1 and transfuse if Blood count below 7.0  DVT prophylaxis Heparin  Code Status: Full code Family Communication: No family at bedside Disposition Plan: inpatient pedning completion of work-up   Consultants:  Nephrology  oncology  Procedures:   suprapubic catheter replaced in the ED  Antibiotics:  Cefepime 07/10/14  HPI/Subjective:  In mod-severe pain Dilaudid helped only marginally Worried ++  Wants to know when he will have relief   Objective: Filed Vitals:   07/18/14 1535  BP: 132/70  Pulse: 80  Temp: 97.6 F (36.4 C)  Resp: 16    Intake/Output Summary (Last 24 hours) at 07/18/14 1810 Last data filed  at 07/18/14 1200  Gross per 24 hour  Intake    120 ml  Output   1850 ml  Net  -1730 ml   Filed Weights   07/10/14 1331 07/12/14 0500  Weight: 52.164 kg  (115 lb) 55 kg (121 lb 4.1 oz)    Exam:   General:  tearful and in pain-frail and cachectic  Cardiovascular:  S1-S2 is regular  Respiratory:  clear to auscultation bilaterally  Abdomen:  soft, nontender, no organomegaly, suprapubic catheter in place  Patient unable to Lift RLE   Data Reviewed: Basic Metabolic Panel:  Recent Labs Lab 07/12/14 0345 07/13/14 0530 07/14/14 0540 07/16/14 0521 07/17/14 0520  NA 141 136 136 133* 132*  K 2.9* 3.6 3.6 4.1 4.6  CL 96 93* 98 100* 100*  CO2 35* 33* _0 GLUCOSE 120* 76 74 117* 109*  BUN 73* 66* 63* 55* 55*  CREATININE 4.62* 4.01* 3.84* 3.14* 3.08*  CALCIUM 9.9 9.4 9.4 10.2 10.4*  PHOS 2.7 1.6*  --   --   --    Liver Function Tests:  Recent Labs Lab 07/12/14 0345 07/13/14 0530 07/16/14 0521 07/17/14 0520  AST  --   --  47* 58*  ALT  --   --  32 53  ALKPHOS  --   --  64 59  BILITOT  --   --  0.3 0.3  PROT  --   --  6.8 6.7  ALBUMIN 1.8* 1.7* 2.0* 2.1*   No results for input(s): LIPASE, AMYLASE in the last 168 hours. No results for input(s): AMMONIA in the last 168 hours. CBC:  Recent Labs Lab 07/13/14 0530 07/14/14 0540 07/16/14 0521 07/17/14 0520 07/18/14 0523  WBC 9.2 8.9 13.5* 10.6* 11.2*  NEUTROABS 6.2 5.6 11.6* 9.1* 7.5  HGB 7.2* 7.8* 8.3* 7.9* 8.5*  HCT 24.7* 26.0* 26.8* 27.4* 27.5*  MCV 71.4* 71.4* 70.5* 71.7* 71.6*  PLT 339 342 352 364 362   Cardiac Enzymes: No results for input(s): CKTOTAL, CKMB, CKMBINDEX, TROPONINI in the last 168 hours. BNP (last 3 results) No results for input(s): BNP in the last 8760 hours.  ProBNP (last 3 results)  Recent Labs  10/06/13 2143  PROBNP 1216.0*    CBG: No results for input(s): GLUCAP in the last 168 hours.  Recent Results (from the past 240 hour(s))  Blood culture (routine x 2)     Status: None   Collection Time: 07/10/14 12:58 PM  Result Value Ref Range Status   Specimen Description BLOOD LEFT ARM  Final   Special Requests BOTTLES DRAWN  AEROBIC ONLY 10CC  Final   Culture   Final    NO GROWTH 5 DAYS Performed at [facility]    Report Status 07/16/2014 FINAL  Final  Blood culture (routine x 2)     Status: None   Collection Time: 07/10/14  2:59 PM  Result Value Ref Range Status   Specimen Description BLOOD LEFT WRIST  Final   Special Requests BOTTLES DRAWN AEROBIC ONLY 3CC  Final   Culture   Final    NO GROWTH 5 DAYS Performed at [facility]    Report Status 07/16/2014 FINAL  Final  Urine culture     Status: None   Collection Time: 07/10/14  4:10 PM  Result Value Ref Range Status   Specimen Description URINE, RANDOM  Final   Special Requests NONE  Final   Colony Count   Final    >=100,000 COLONIES/ML Performed  at [facility]   Final    ESCHERICHIA COLI Note: Confirmed Extended Spectrum Beta-Lactamase Producer (ESBL) Note: CRITICAL RESULT CALLED TO, READ BACK BY AND VERIFIED WITH: BETTY JONES AT 5:40 A.M. ON 07/13/2014 WARRB Performed at [facility]    Report Status 07/13/2014 FINAL  Final   Organism ID, Bacteria ESCHERICHIA COLI  Final      Susceptibility   Escherichia coli - MIC*    AMPICILLIN >=32 RESISTANT Resistant     CEFAZOLIN >=64 RESISTANT Resistant     CEFTRIAXONE >=64 RESISTANT Resistant     CIPROFLOXACIN >=4 RESISTANT Resistant     GENTAMICIN <=1 SENSITIVE Sensitive     LEVOFLOXACIN >=8 RESISTANT Resistant     NITROFURANTOIN 32 SENSITIVE Sensitive     TOBRAMYCIN <=1 SENSITIVE Sensitive     TRIMETH/SULFA <=20 SENSITIVE Sensitive     IMIPENEM <=0.25 SENSITIVE Sensitive     PIP/TAZO <=4 SENSITIVE Sensitive     * ESCHERICHIA COLI  MRSA PCR Screening     Status: None   Collection Time: 07/10/14  4:10 PM  Result Value Ref Range Status   MRSA by PCR NEGATIVE NEGATIVE Final    Comment:        The GeneXpert MRSA Assay (FDA approved for NASAL specimens only), is one component of a comprehensive MRSA colonization surveillance program. It is  not intended to diagnose MRSA infection nor to guide or monitor treatment for MRSA infections.   Clostridium Difficile by PCR     Status: None   Collection Time: 07/12/14  9:18 AM  Result Value Ref Range Status   C difficile by pcr NEGATIVE NEGATIVE Final     Studies: Mr Thoracic Spine Wo Contrast  07/18/2014   CLINICAL DATA:  History of colonic adenocarcinoma. Status post hemicolectomy in 2006.  EXAM: MRI THORACIC SPINE WITHOUT CONTRAST  TECHNIQUE: Multiplanar, multisequence MR imaging of the thoracic spine was performed. No intravenous contrast was administered.  COMPARISON:  CT chest, abdomen and pelvis 07/14/2014.  FINDINGS: Multiple foci of abnormal marrow signal are identified consistent with metastatic disease. Large deposits are seen posteriorly in the T10 centrally and to the left with extension to the left pedicle and in T11 eccentric to the right where there is involvement of the right pedicle. The lesion in T11 is larger. Multiple lesions in spinous processes are also identified, most conspicuous in T4 and T7. The thoracic cord demonstrates normal size, shape and signal. Small left pleural effusion is identified. A subpleural nodule on the left proximally T5-6 measures 1.2 cm. Atrophic appearing left kidney with signal abnormality correlates with chronic pyelonephritis seen on prior exams.  T10-11: Tumor deposit posteriorly is identified extending into the left pedicle. There is a small amount of epidural tumor in the left paracentral position indenting the ventral thecal sac. There is no central canal stenosis or cord signal abnormality.  At T11, there is epidural tumor centrally and to the right indenting the ventral thecal sac without causing central canal stenosis or cord signal abnormality. The foramina appear open at both levels.  IMPRESSION: Multifocal osseous metastatic disease. Epidural tumor is seen at T10 and T11 as detailed above. Changes are more prominent at T11 but there is no  cord signal abnormality or resultant central canal stenosis.  Small right pleural effusion. Subpleural nodule on the right at approximately T5-6 is likely a metastatic deposit.   Electronically Signed   By: Inge Rise M.D.   On: 07/18/2014 13:23  Ct Biopsy  07/17/2014   CLINICAL DATA:  Multiple lytic bone lesions.  No known primary.  EXAM: CT GUIDED CORE BIOPSY OF RIGHT ILIAC BONE LESION  ANESTHESIA/SEDATION: Intravenous Fentanyl and Versed were administered as conscious sedation during continuous cardiorespiratory monitoring by the radiology RN, with a total moderate sedation time of 4 minutes.  PROCEDURE: The procedure risks, benefits, and alternatives were explained to the patient. Questions regarding the procedure were encouraged and answered. The patient understands and consents to the procedure.  Patient placed prone. Limited axial scans through the pelvis were obtained and the lytic right iliac lesion was identified. An appropriate skin entry site was determined and marked.  The operative field was prepped with Betadinein a sterile fashion, and a sterile drape was applied covering the operative field. A sterile gown and sterile gloves were used for the procedure. Local anesthesia was provided with 1% Lidocaine.  Under CT fluoroscopic guidance, a 17 gauge trocar needle was advanced to the margin of the lesion. Once needle tip position was confirmed, coaxial 18-gauge core biopsy samples were obtained, submitted in saline to surgical pathology. The guide needle was removed.  Postprocedure scans show no hemorrhage or other apparent complication. The patient tolerated the procedure well.  COMPLICATIONS: None immediate  FINDINGS: Limited imaging confirms a large lytic lesion in the right iliac bone at the level of the SI joint, with an exophytic soft tissue component. Core biopsy samples were obtained as above.  IMPRESSION: 1. Technically successful CT-guided deep bone core biopsy of lytic right iliac  lesion.   Electronically Signed   By: Lucrezia Europe M.D.   On: 07/17/2014 11:05    Scheduled Meds: . darbepoetin (ARANESP) injection - NON-DIALYSIS  100 mcg Subcutaneous Q Wed-1800  . feeding supplement (NEPRO CARB STEADY)  237 mL Oral TID BM  . heparin  5,000 Units Subcutaneous 3 times per day  . pamidronate  60 mg Intravenous Once   Continuous Infusions: . lactated ringers 20 mL/hr at 07/18/14 0709    Active Problems:   Renal failure   Metabolic acidosis   UTI (lower urinary tract infection)   Sepsis   Lytic bone lesions on xray    Time spent: 25 min   Verneita Griffes, MD [facility] (P) [phone]

## 2014-07-18 NOTE — Progress Notes (Signed)
OT Cancellation Note  Patient Details Name: [name] MRN: [id number] DOB: Mar 22, 1940   Cancelled Treatment:    Reason Eval/Treat Not Completed: Patient at procedure or test/ unavailable. Pt in surgery per chart for MRI under anthesia.  Almon Register [phone] 07/18/2014, 7:30 AM

## 2014-07-18 NOTE — Progress Notes (Signed)
Received report from Moore, RN at [facility] MRI,  Pt arrived unit, alert and oriented, able to communicate needs. Will continue with current plan of care.

## 2014-07-18 NOTE — Progress Notes (Signed)
IP PROGRESS NOTE  Subjective:    No new complaints. He reports the right "hip "discomfort is significantly improved. He feels "groggy" this afternoon after his biopsy. Denies any respiratory or cardiac complaints.   Objective: Vital signs in last 24 hours: Blood pressure 125/66, pulse 78, temperature 97.7 F (36.5 C), temperature source Oral, resp. rate 18, height '6\' 5"'$  (1.956 m), weight 121 lb 4.1 oz (55 kg), SpO2 98 %.  Intake/Output from previous day: 05/02 0701 - 05/03 0700 In: 240 [P.O.:240] Out: 1350 [Urine:1350]  Physical Exam:  Alert. Generalized weakness of the right leg, though he can move the leg and foot. Pain at the right iliac with movement of the right leg.   Lab Results:  Recent Labs  07/17/14 0520 07/18/14 0523  WBC 10.6* 11.2*  HGB 7.9* 8.5*  HCT 27.4* 27.5*  PLT 364 362    BMET  Recent Labs  07/16/14 0521 07/17/14 0520  NA 133* 132*  K 4.1 4.6  CL 100* 100*  CO2 25 25  GLUCOSE 117* 109*  BUN 55* 55*  CREATININE 3.14* 3.08*  CALCIUM 10.2 10.4*    Studies/Results: Mr Thoracic Spine Wo Contrast  07/18/2014   CLINICAL DATA:  History of colonic adenocarcinoma. Status post hemicolectomy in 2006.  EXAM: MRI THORACIC SPINE WITHOUT CONTRAST  TECHNIQUE: Multiplanar, multisequence MR imaging of the thoracic spine was performed. No intravenous contrast was administered.  COMPARISON:  CT chest, abdomen and pelvis 07/14/2014.  FINDINGS: Multiple foci of abnormal marrow signal are identified consistent with metastatic disease. Large deposits are seen posteriorly in the T10 centrally and to the left with extension to the left pedicle and in T11 eccentric to the right where there is involvement of the right pedicle. The lesion in T11 is larger. Multiple lesions in spinous processes are also identified, most conspicuous in T4 and T7. The thoracic cord demonstrates normal size, shape and signal. Small left pleural effusion is identified. A subpleural nodule on the  left proximally T5-6 measures 1.2 cm. Atrophic appearing left kidney with signal abnormality correlates with chronic pyelonephritis seen on prior exams.  T10-11: Tumor deposit posteriorly is identified extending into the left pedicle. There is a small amount of epidural tumor in the left paracentral position indenting the ventral thecal sac. There is no central canal stenosis or cord signal abnormality.  At T11, there is epidural tumor centrally and to the right indenting the ventral thecal sac without causing central canal stenosis or cord signal abnormality. The foramina appear open at both levels.  IMPRESSION: Multifocal osseous metastatic disease. Epidural tumor is seen at T10 and T11 as detailed above. Changes are more prominent at T11 but there is no cord signal abnormality or resultant central canal stenosis.  Small right pleural effusion. Subpleural nodule on the right at approximately T5-6 is likely a metastatic deposit.   Electronically Signed   By: Inge Rise M.D.   On: 07/18/2014 13:23   Ct Biopsy  07/17/2014   CLINICAL DATA:  Multiple lytic bone lesions.  No known primary.  EXAM: CT GUIDED CORE BIOPSY OF RIGHT ILIAC BONE LESION  ANESTHESIA/SEDATION: Intravenous Fentanyl and Versed were administered as conscious sedation during continuous cardiorespiratory monitoring by the radiology RN, with a total moderate sedation time of 4 minutes.  PROCEDURE: The procedure risks, benefits, and alternatives were explained to the patient. Questions regarding the procedure were encouraged and answered. The patient understands and consents to the procedure.  Patient placed prone. Limited axial scans through the pelvis were  obtained and the lytic right iliac lesion was identified. An appropriate skin entry site was determined and marked.  The operative field was prepped with Betadinein a sterile fashion, and a sterile drape was applied covering the operative field. A sterile gown and sterile gloves were used for  the procedure. Local anesthesia was provided with 1% Lidocaine.  Under CT fluoroscopic guidance, a 17 gauge trocar needle was advanced to the margin of the lesion. Once needle tip position was confirmed, coaxial 18-gauge core biopsy samples were obtained, submitted in saline to surgical pathology. The guide needle was removed.  Postprocedure scans show no hemorrhage or other apparent complication. The patient tolerated the procedure well.  COMPLICATIONS: None immediate  FINDINGS: Limited imaging confirms a large lytic lesion in the right iliac bone at the level of the SI joint, with an exophytic soft tissue component. Core biopsy samples were obtained as above.  IMPRESSION: 1. Technically successful CT-guided deep bone core biopsy of lytic right iliac lesion.   Electronically Signed   By: Lucrezia Europe M.D.   On: 07/17/2014 11:05    Medications: I have reviewed the patient's current medications.  Assessment/Plan:  1. 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 Pain secondary to destructive lesion at the right iliac-improved with Decadron, now discontinued as of 5/2  3. Escherichia coli urinary tract infection/bacteremia  4. Chronic renal failure  5. Chronic microcytic anemia  6. History of stage I colon cancer December 2006  7. COPD  8. Urethral obstruction, status post placement of a suprapubic catheter  9. Hypercalcemia-pamidronate on 07/18/2014  Mr. Aultman reports pain at the right iliac. He has difficulty moving the right leg secondary to pain and weakness.  Recommendations: 1. Await  biopsy results. Will follow-up on the pathology and make treatment recommendations 2. Aredia has been order at 60 mg IV  x1 by Pharmacy protocol   LOS: 8 days   [facility]  07/18/2014, 3:38 PM  I reviewed the slides from the right iliac biopsy with Dr. Tresa Moore. The histology is consistent with metastatic carcinoma and not a hematologic malignancy. Immunohistochemical stains are  pending.  He had significant pain relief when placed on Decadron. I will resume Decadron therapy and ask Dr. Tammi Klippel to consider palliative radiation to the right iliac lesion. He may also consider treating the T10 and T11 lesions.

## 2014-07-18 NOTE — Progress Notes (Signed)
Spoke with care Link concerning Mr [name]  06:30 pick up for procedure at  [facility].  Confirmed.

## 2014-07-19 ENCOUNTER — Ambulatory Visit
Admit: 2014-07-19 | Discharge: 2014-07-19 | Disposition: A | Discharge status: Home / Self Care | Payer: Medicare Other | Attending: Radiation Oncology | Admitting: Radiation Oncology

## 2014-07-19 ENCOUNTER — Encounter (HOSPITAL_COMMUNITY): Payer: Self-pay | Admitting: Radiology

## 2014-07-19 DIAGNOSIS — C7949 Secondary malignant neoplasm of other parts of nervous system: Secondary | ICD-10-CM

## 2014-07-19 LAB — CBC WITH DIFFERENTIAL/PLATELET
Basophils Absolute: 0 10*3/uL (ref 0.0–0.1)
Basophils Relative: 0 % (ref 0–1)
Eosinophils Absolute: 0 10*3/uL (ref 0.0–0.7)
Eosinophils Relative: 0 % (ref 0–5)
HEMATOCRIT: 30.7 % — AB (ref 39.0–52.0)
HEMOGLOBIN: 9 g/dL — AB (ref 13.0–17.0)
LYMPHS PCT: 8 % — AB (ref 12–46)
Lymphs Abs: 0.9 10*3/uL (ref 0.7–4.0)
MCH: 21.1 pg — ABNORMAL LOW (ref 26.0–34.0)
MCHC: 29.3 g/dL — ABNORMAL LOW (ref 30.0–36.0)
MCV: 72.1 fL — ABNORMAL LOW (ref 78.0–100.0)
MONOS PCT: 2 % — AB (ref 3–12)
Monocytes Absolute: 0.2 10*3/uL (ref 0.1–1.0)
Neutro Abs: 9.7 10*3/uL — ABNORMAL HIGH (ref 1.7–7.7)
Neutrophils Relative %: 90 % — ABNORMAL HIGH (ref 43–77)
PLATELETS: 389 10*3/uL (ref 150–400)
RBC: 4.26 MIL/uL (ref 4.22–5.81)
RDW: 15.6 % — ABNORMAL HIGH (ref 11.5–15.5)
WBC: 10.8 10*3/uL — ABNORMAL HIGH (ref 4.0–10.5)

## 2014-07-19 LAB — COMPREHENSIVE METABOLIC PANEL
ALT: 50 U/L (ref 17–63)
AST: 29 U/L (ref 15–41)
Albumin: 2.5 g/dL — ABNORMAL LOW (ref 3.5–5.0)
Alkaline Phosphatase: 61 U/L (ref 38–126)
Anion gap: 8 (ref 5–15)
BUN: 42 mg/dL — ABNORMAL HIGH (ref 6–20)
CALCIUM: 10.8 mg/dL — AB (ref 8.9–10.3)
CO2: 24 mmol/L (ref 22–32)
Chloride: 103 mmol/L (ref 101–111)
Creatinine, Ser: 2.27 mg/dL — ABNORMAL HIGH (ref 0.61–1.24)
GFR calc Af Amer: 31 mL/min — ABNORMAL LOW (ref >60)
GFR calc non Af Amer: 27 mL/min — ABNORMAL LOW (ref >60)
Glucose, Bld: 80 mg/dL (ref 70–99)
Potassium: 5 mmol/L (ref 3.5–5.1)
SODIUM: 135 mmol/L (ref 135–145)
Total Bilirubin: 0.6 mg/dL (ref 0.3–1.2)
Total Protein: 7.4 g/dL (ref 6.5–8.1)

## 2014-07-19 MED ORDER — TROLAMINE SALICYLATE 10 % EX CREA
TOPICAL_CREAM | Freq: Two times a day (BID) | CUTANEOUS | Status: DC | PRN
  Filled 2014-07-19: qty 85

## 2014-07-19 NOTE — Progress Notes (Signed)
  Radiation Oncology         ([phone]) [phone] ________________________________  Name: Adyen Bifulco MRN: [id number]  Date: 07/19/2014  DOB: 16-Nov-1940  SIMULATION AND TREATMENT PLANNING NOTE    ICD-9-CM ICD-10-CM   1. 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  Identity was confirmed.  All relevant records and images related to the planned course of therapy were reviewed.  The patient freely provided informed written consent to proceed with treatment after reviewing the details related to the planned course of therapy. The consent form was witnessed and verified by the simulation staff. Intravenous access was established for contrast administration. Then, the patient was set-up in a stable reproducible supine position for radiation therapy.  A vaclock body bag was used for precise immobilization.  CT images were obtained.  Surface markings were placed.  The CT images were loaded into the planning software and fused with the patient's targeting MRI scan.  Then the target and avoidance structures were contoured.  Treatment planning then occurred.  The radiation prescription was entered and confirmed.  I have requested 3D planning  I have requested a DVH of the following structures: spinal cord, target volumes, lungs, bowel.  PLAN:  To be performed by Dr. Tammi Klippel [facility] and SBRT)  -----------------------------------  Eppie Gibson, MD

## 2014-07-19 NOTE — Progress Notes (Signed)
Patient Demographics  [name], is a 74 y.o. male, DOB - 11/13/1940, LKJ:[id number]  Admit date - 07/10/2014   Admitting Physician Oswald Hillock, MD  Outpatient Primary MD for the patient is No primary care provider on file.  LOS - 9   Chief Complaint  Patient presents with  . Weakness  . Urinary Incontinence    Leaking indwelling catheter      Summary  74 y/o ? 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  It was felt that Renal failure was 2/2 to catheter clogging and as such, it was felt that the ESBL in the urine might be a contaminant therefore antibiotics were narrowed to Bactrim DS, Because of bony pain in his R hip with a bruise, He had a hip xray 4/28. This suggested Metastatic lesion to that area-CT scan confirmed large Ishial met + pubic Met + Vertebral met at t11 with cord compression. Oncology consulted, Rad-Onc consulted. Patient started on decadron . IR performed BM biopsy on 07/17/14-preliminary result is Carcinoma    Subjective:   [employer] today has, No headache, No chest pain, No abdominal pain - No Nausea, No new weakness tingling or numbness, No Cough - SOB.    Assessment & Plan    1. Suspected prostate cancer with metastatic lesion to T-spine, right iliac bone and possibly lung. Oncology following, pathology results pending from biopsy. Currently on Decadron. An supportive care. Has suprapubic catheter. Overall prognosis appears poor. We will defer further management to oncology. He appears  cachectic and extremely frail, call palliative care as well for long-term goals of care.   2. 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 Currently on Decadron, received pamidronate on 07/18/2014. Repeat calcium levels and monitor closely. Further management per oncology.   3. Chronic suprapubic Foley catheter with UTI caused by it versus chronic colonization - rated with fosfomycin and appropriate antibiotics. Now off treatment. Catheter was replaced in ER.   4. Peripheral neuropathy. On Neurontin.   5. 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 Baseline creat close to 2.5, this was due to dehydration, after hydration stable. Renal has signed off.   6. Chronic acidosis due to acute renal failure and sepsis. Resolved after supportive care. Initially required bicarbonate infusion and IV fluids.   7. Anemia of chronic disease. Stable outpatient workup with PCP and oncologist.     Code Status: Full  Family Communication: None present  Disposition Plan:  To be decided   Procedures   Suprapubic catheter replacement in ER upon admission    Consults   nephrology, oncology, radiation oncology   Medications  Scheduled Meds: . darbepoetin (ARANESP) injection - NON-DIALYSIS  100 mcg Subcutaneous Q Wed-1800  . dexamethasone  4 mg Oral Q12H  . feeding supplement (NEPRO CARB STEADY)  237 mL Oral TID BM  . heparin  5,000 Units Subcutaneous 3 times per day   Continuous Infusions: . lactated ringers 20 mL/hr at 07/18/14 0709   PRN Meds:.acetaminophen **OR** acetaminophen, HYDROcodone-acetaminophen,  HYDROmorphone (DILAUDID) injection, MUSCLE RUB, ondansetron **OR** ondansetron (ZOFRAN) IV, zolpidem  DVT Prophylaxis  Lovenox - Heparin     Lab Results  Component Value Date   PLT 389 07/19/2014    Antibiotics   Anti-infectives    Start     Dose/Rate Route Frequency Ordered Stop   07/13/14 1800  imipenem-cilastatin (PRIMAXIN) 250 mg in sodium chloride 0.9 % 100 mL IVPB  Status:   Discontinued     250 mg 200 mL/hr over 30 Minutes Intravenous Every 12 hours 07/13/14 0616 07/13/14 1528   07/13/14 0630  imipenem-cilastatin (PRIMAXIN) 250 mg in sodium chloride 0.9 % 100 mL IVPB     250 mg 200 mL/hr over 30 Minutes Intravenous  Once 07/13/14 0616 07/13/14 0701   07/11/14 1600  ceFEPIme (MAXIPIME) 500 mg in dextrose 5 % 50 mL IVPB  Status:  Discontinued     500 mg 100 mL/hr over 30 Minutes Intravenous Every 24 hours 07/10/14 1448 07/13/14 0553   07/10/14 1315  ceFEPIme (MAXIPIME) 2 g in dextrose 5 % 50 mL IVPB     2 g 100 mL/hr over 30 Minutes Intravenous  Once 07/10/14 1300 07/10/14 1610          Objective:   Filed Vitals:   07/18/14 1505 07/18/14 1535 07/18/14 2040 07/19/14 0553  BP: 130/72 132/70 138/56 129/69  Pulse: 82 80 88 92  Temp: 97.4 F (36.3 C) 97.6 F (36.4 C) 98.1 F (36.7 C) 97.9 F (36.6 C)  TempSrc: Oral  Oral Oral  Resp: _0 Height:      Weight:      SpO2: 97% 97% 100% 98%    Wt Readings from Last 3 Encounters:  07/12/14 55 kg (121 lb 4.1 oz)  10/10/13 56.9 kg (125 lb 7.1 oz)  03/29/08 73.936 kg (163 lb)     Intake/Output Summary (Last 24 hours) at 07/19/14 1150 Last data filed at 07/19/14 1116  Gross per 24 hour  Intake    600 ml  Output   1200 ml  Net   -600 ml     Physical Exam  Awake Alert, Oriented X 3, No new F.N deficits, Normal affect [place].AT,PERRAL Supple Neck,No JVD, No cervical lymphadenopathy appriciated.  Symmetrical Chest wall movement, Good air movement bilaterally, CTAB RRR,No Gallops,Rubs or new Murmurs, No Parasternal Heave +ve B.Sounds, Abd Soft, No tenderness, No organomegaly appriciated, No rebound - guarding or rigidity. No Cyanosis, Clubbing or edema, No new Rash or bruise      Data Review   Micro Results Recent Results (from the past 240 hour(s))  Blood culture (routine x 2)     Status: None   Collection Time: 07/10/14 12:58 PM  Result Value Ref Range Status   Specimen Description  BLOOD LEFT ARM  Final   Special Requests BOTTLES DRAWN AEROBIC ONLY 10CC  Final   Culture   Final    NO GROWTH 5 DAYS Performed at [facility]    Report Status 07/16/2014 FINAL  Final  Blood culture (routine x 2)     Status: None   Collection Time: 07/10/14  2:59 PM  Result Value Ref Range Status   Specimen Description BLOOD LEFT WRIST  Final   Special Requests BOTTLES DRAWN AEROBIC ONLY 3CC  Final   Culture   Final    NO GROWTH 5 DAYS Performed at [facility]    Report Status 07/16/2014 FINAL  Final  Urine culture     Status:  None   Collection Time: 07/10/14  4:10 PM  Result Value Ref Range Status   Specimen Description URINE, RANDOM  Final   Special Requests NONE  Final   Colony Count   Final    >=100,000 COLONIES/ML Performed at [facility]    Culture   Final    ESCHERICHIA COLI Note: Confirmed Extended Spectrum Beta-Lactamase Producer (ESBL) Note: CRITICAL RESULT CALLED TO, READ BACK BY AND VERIFIED WITH: BETTY JONES AT 5:40 A.M. ON 07/13/2014 WARRB Performed at [facility]    Report Status 07/13/2014 FINAL  Final   Organism ID, Bacteria ESCHERICHIA COLI  Final      Susceptibility   Escherichia coli - MIC*    AMPICILLIN >=32 RESISTANT Resistant     CEFAZOLIN >=64 RESISTANT Resistant     CEFTRIAXONE >=64 RESISTANT Resistant     CIPROFLOXACIN >=4 RESISTANT Resistant     GENTAMICIN <=1 SENSITIVE Sensitive     LEVOFLOXACIN >=8 RESISTANT Resistant     NITROFURANTOIN 32 SENSITIVE Sensitive     TOBRAMYCIN <=1 SENSITIVE Sensitive     TRIMETH/SULFA <=20 SENSITIVE Sensitive     IMIPENEM <=0.25 SENSITIVE Sensitive     PIP/TAZO <=4 SENSITIVE Sensitive     * ESCHERICHIA COLI  MRSA PCR Screening     Status: None   Collection Time: 07/10/14  4:10 PM  Result Value Ref Range Status   MRSA by PCR NEGATIVE NEGATIVE Final    Comment:        The GeneXpert MRSA Assay (FDA approved for NASAL specimens only), is one component of a comprehensive  MRSA colonization surveillance program. It is not intended to diagnose MRSA infection nor to guide or monitor treatment for MRSA infections.   Clostridium Difficile by PCR     Status: None   Collection Time: 07/12/14  9:18 AM  Result Value Ref Range Status   C difficile by pcr NEGATIVE NEGATIVE Final    Radiology Reports Ct Abdomen Pelvis Wo Contrast  07/14/2014   CLINICAL DATA:  Urinary tract infection.  Renal failure.  EXAM: CT ABDOMEN AND PELVIS WITHOUT CONTRAST  TECHNIQUE: Multidetector CT imaging of the abdomen and pelvis was performed following the standard protocol without IV contrast.  COMPARISON:  10/08/2006  FINDINGS: Lower chest: Small left pleural effusion is identified with overlying atelectasis/ consolidation. Mild pleural thickening is identified overlying a small right effusion.  Hepatobiliary: Calcified granulomas identified within the liver and spleen. There is a stone within the gallbladder measuring 7 mm, image 33/series 2.  Pancreas: Neck  Spleen: Splenic granulomas noted.  Adrenals/Urinary Tract: Normal appearance of the adrenal glands. Bilateral hydronephrosis and hydroureter is again noted. There is thickening involving the wall of the right renal collecting system and right ureter. Scarring and cortical volume loss involving the right kidney is noted compatible with chronic pyelonephritis. Gas is identified within the left renal pelvis and left ureter. There is also thickening of the wall of the left renal collecting system and ureter. Urinary bladder is partially collapsed around a suprapubic catheter.  Stomach/Bowel: Moderate distension of the urinary bladder. The small bowel loops have a normal caliber without evidence for bowel obstruction. The colon has a normal caliber without evidence for obstruction.  Vascular/Lymphatic: Calcified atherosclerotic disease involves the abdominal aorta. No aneurysm. Enlarged retroperitoneal lymph nodes identified. Index periaortic node  measures 1 cm, image 28/series 2. This is compared with 1.2 cm previously. Index periaortic lymph node measures 9 mm, image 35/series 2. Previously this measured the same.  Reproductive: Prostate gland enlargement is noted.  Other: No free fluid or fluid collections within the abdomen or pelvis.  Musculoskeletal: There is a large destructive lytic lesion involving the right iliac bone and gluteal musculature. This measures 7.3 x 4.8 cm lytic lesion involving the right pubic symphysis measures 3.4 by 4.3 cm. Destructive bone lesion involving the T11 vertebra measures 3.1 x 2.8 cm. There is tumor extending into the canal and has mass effect upon the cord, image 6 of series 2.  IMPRESSION: 1. Findings are consistent with the clinical history of chronic pyelonephritis and bilateral hydronephrosis. 2. Multi focal lytic bone metastasis. There is a lytic lesion involving the T11 vertebra which appears to extend into the canal and exhibits mass effect upon the spinal cord. 3. Metastatic adenopathy noted within the upper abdomen. 4. Aortic atherosclerosis. 5. Gallstone 6. Small bilateral pleural effusions left greater than right. These results will be called to the ordering clinician or representative by the Radiologist Assistant, and communication documented in the PACS or zVision Dashboard.   Electronically Signed   By: Kerby Moors M.D.   On: 07/14/2014 12:01   Ct Chest Wo Contrast  07/14/2014   CLINICAL DATA:  Shortness of breath. Metastatic colon cancer and acute renal failure.  EXAM: CT CHEST WITHOUT CONTRAST  TECHNIQUE: Multidetector CT imaging of the chest was performed following the standard protocol without IV contrast.  COMPARISON:  None.  FINDINGS: THORACIC INLET/BODY WALL:  No acute abnormality.  MEDIASTINUM:  Normal heart size. No pericardial effusion. No acute vascular abnormality. Calcified mediastinal lymph nodes compatible with remote granulomatous disease. No noncalcified adenopathy.  LUNG WINDOWS:   Volume loss and opacity in the left lower lobe with small pleural effusion. No associated pneumonia. No edema or pneumothorax. The lungs are hyperinflated and there is centrilobular emphysema. There is a 6 mm sub solid nodule in the left upper lobe on image 14. Smaller nodule present in the left upper lobe on image 18. These could be incidental or related to early pulmonary metastatic spread. Calcification in the left posterior costophrenic sulcus is likely a pulmonary granuloma, present by chest x-ray over multiple years.  UPPER ABDOMEN:  Known hydronephrosis bilaterally, with gas in the left urinary collecting system. Granulomatous changes present in the spleen and liver. Cholelithiasis. These findings were reported on CT from earlier the same day.  OSSEOUS:  Multiple lytic metastatic lesions, mainly noted in the spine and ribs. The most notable lesion is in the T11 vertebra, involving the right body and pedicle with growth into the spinal canal and probable mass effect on the thecal sac. This was reported on the previous study.  IMPRESSION: 1. Small left pleural effusion with mild atelectasis. 2. Emphysema. 3. Multi focal osseous metastatic disease with T11 tumor infiltrating the spinal canal and deforming/involving the thecal sac. 4. Two subcentimeter pulmonary nodules.   Electronically Signed   By: Monte Fantasia M.D.   On: 07/14/2014 16:41   Mr Thoracic Spine Wo Contrast  07/18/2014   CLINICAL DATA:  History of colonic adenocarcinoma. Status post hemicolectomy in 2006.  EXAM: MRI THORACIC SPINE WITHOUT CONTRAST  TECHNIQUE: Multiplanar, multisequence MR imaging of the thoracic spine was performed. No intravenous contrast was administered.  COMPARISON:  CT chest, abdomen and pelvis 07/14/2014.  FINDINGS: Multiple foci of abnormal marrow signal are identified consistent with metastatic disease. Large deposits are seen posteriorly in the T10 centrally and to the left with extension to the left pedicle and in  T11 eccentric to the  right where there is involvement of the right pedicle. The lesion in T11 is larger. Multiple lesions in spinous processes are also identified, most conspicuous in T4 and T7. The thoracic cord demonstrates normal size, shape and signal. Small left pleural effusion is identified. A subpleural nodule on the left proximally T5-6 measures 1.2 cm. Atrophic appearing left kidney with signal abnormality correlates with chronic pyelonephritis seen on prior exams.  T10-11: Tumor deposit posteriorly is identified extending into the left pedicle. There is a small amount of epidural tumor in the left paracentral position indenting the ventral thecal sac. There is no central canal stenosis or cord signal abnormality.  At T11, there is epidural tumor centrally and to the right indenting the ventral thecal sac without causing central canal stenosis or cord signal abnormality. The foramina appear open at both levels.  IMPRESSION: Multifocal osseous metastatic disease. Epidural tumor is seen at T10 and T11 as detailed above. Changes are more prominent at T11 but there is no cord signal abnormality or resultant central canal stenosis.  Small right pleural effusion. Subpleural nodule on the right at approximately T5-6 is likely a metastatic deposit.   Electronically Signed   By: Inge Rise M.D.   On: 07/18/2014 13:23   Mr Lumbar Spine Wo Contrast  07/15/2014   CLINICAL DATA:  Colon carcinoma treated with hemicolectomy. Recent discovery of multiple lytic bone metastasis.  EXAM: MRI LUMBAR SPINE WITHOUT CONTRAST  TECHNIQUE: Multiplanar, multisequence MR imaging of the lumbar spine was performed. No intravenous contrast was administered.  COMPARISON:  CT 07/14/2014  FINDINGS: The scan covers the region from mid T10 through mid S3. The lower portion of the T10 vertebral body shows a metastasis within the posterior central vertebral body, possibly with early epidural extension to the left of midline.  At T11,  there is more extensive metastatic disease within the vertebral body extending into the right pedicle. There is some extraosseous extension, encroaching upon the epidural space but not compressing the cord, which is surrounded by ample subarachnoid fluid. Some tumor encroachment upon the intervertebral foramen on the right at T10-11 and on the right at T11-12 is noted.  No lesions seen at T12, L1, L2, L3 or L4.  At L5, there is metastasis within the right transverse process. No compromise of the neural spaces. No metastasis seen in the upper sacrum. There is a large lesion of the right iliac bone, not completely imaged, measuring up to 7.3 cm in diameter, with extraosseous extension superficially.  There are changes of mild degenerative disc disease with insignificant disc bulges at L3-4, L4-5 and L5-S1. No neural compression. Chronic endplate marrow changes are present in the lower lumbar spine.  IMPRESSION: Metastatic disease affecting the right side of the T11 vertebral body extending into the right pedicle. Early epidural extension of tumor, without cord compression. Early encroachment upon the intervertebral foramen on the right at T10-11 and T11-12.  Metastatic disease seen within the inferior aspect of the T10 vertebral body. That level was not completely image to. Metastatic disease within the right transverse process of L5. Large metastatic lesion within the right iliac bone.   Electronically Signed   By: Nelson Chimes M.D.   On: 07/15/2014 17:55   Ct Biopsy  07/17/2014   CLINICAL DATA:  Multiple lytic bone lesions.  No known primary.  EXAM: CT GUIDED CORE BIOPSY OF RIGHT ILIAC BONE LESION  ANESTHESIA/SEDATION: Intravenous Fentanyl and Versed were administered as conscious sedation during continuous cardiorespiratory monitoring by the radiology RN,  with a total moderate sedation time of 4 minutes.  PROCEDURE: The procedure risks, benefits, and alternatives were explained to the patient. Questions regarding  the procedure were encouraged and answered. The patient understands and consents to the procedure.  Patient placed prone. Limited axial scans through the pelvis were obtained and the lytic right iliac lesion was identified. An appropriate skin entry site was determined and marked.  The operative field was prepped with Betadinein a sterile fashion, and a sterile drape was applied covering the operative field. A sterile gown and sterile gloves were used for the procedure. Local anesthesia was provided with 1% Lidocaine.  Under CT fluoroscopic guidance, a 17 gauge trocar needle was advanced to the margin of the lesion. Once needle tip position was confirmed, coaxial 18-gauge core biopsy samples were obtained, submitted in saline to surgical pathology. The guide needle was removed.  Postprocedure scans show no hemorrhage or other apparent complication. The patient tolerated the procedure well.  COMPLICATIONS: None immediate  FINDINGS: Limited imaging confirms a large lytic lesion in the right iliac bone at the level of the SI joint, with an exophytic soft tissue component. Core biopsy samples were obtained as above.  IMPRESSION: 1. Technically successful CT-guided deep bone core biopsy of lytic right iliac lesion.   Electronically Signed   By: Lucrezia Europe M.D.   On: 07/17/2014 11:05   Dg Chest Port 1 View  07/10/2014   CLINICAL DATA:  Weakness.  Urinary incontinence  EXAM: PORTABLE CHEST - 1 VIEW  COMPARISON:  10/06/2013  FINDINGS: Hyperinflation and possible emphysematous change. There are bilateral nipple shadows which are symmetric. There is no edema, consolidation, effusion, or pneumothorax. Normal heart size and aortic contours.  IMPRESSION: COPD without acute superimposed disease.   Electronically Signed   By: Monte Fantasia M.D.   On: 07/10/2014 14:02   Dg Hip Unilat With Pelvis 2-3 Views Right  07/12/2014   CLINICAL DATA:  Right lateral hip pain x2 weeks, no no known injury  EXAM: RIGHT HIP (WITH PELVIS)  2-3 VIEWS  COMPARISON:  None.  FINDINGS: No fracture or dislocation is seen.  Mild degenerative changes of the bilateral hips.  Visualized bony pelvis appears intact.  IMPRESSION: No fracture or dislocation is seen.  Mild degenerative changes of the bilateral hips.   Electronically Signed   By: Julian Hy M.D.   On: 07/12/2014 11:42   Dg Hips Bilat With Pelvis 3-4 Views  07/13/2014   CLINICAL DATA:  Right hip pain.  No known injury.  EXAM: BILATERAL HIP (WITH PELVIS) 3-4 VIEWS  COMPARISON:  07/12/2014  FINDINGS: There is a destructive lesion in the lateral aspect of the right pubic body. There is suggestion of a lytic lesion in the right ilium adjacent to the inferior aspect of the right sacroiliac joint. On the lateral view of the right hip there is a 1 cm lucent area in the proximal right femoral shaft which may be a lytic lesion. There is slight lucency at the superior lateral aspect of right acetabulum which could represent a lytic lesion.  IMPRESSION: Lytic lesions of the right pubic body and right ilium. This could represent metastatic disease. 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   Electronically Signed   By: Lorriane Shire M.D.   On: 07/13/2014 15:53     CBC  Recent Labs Lab 07/14/14 0540 07/16/14 0521 07/17/14 0520 07/18/14 0523 07/19/14 0535  WBC 8.9 13.5* 10.6* 11.2* 10.8*  HGB 7.8* 8.3*  7.9* 8.5* 9.0*  HCT 26.0* 26.8* 27.4* 27.5* 30.7*  PLT 342 352 364 362 389  MCV 71.4* 70.5* 71.7* 71.6* 72.1*  MCH 21.4* 21.8* 20.7* 22.1* 21.1*  MCHC 30.0 31.0 28.8* 30.9 29.3*  RDW 14.9 14.9 15.2 15.1 15.6*  LYMPHSABS 2.2 1.2 1.1 2.1 0.9  MONOABS 0.9 0.7 0.4 1.6* 0.2  EOSABS 0.2 0.0 0.0 0.0 0.0  BASOSABS 0.0 0.0 0.0 0.0 0.0    Chemistries   Recent Labs Lab 07/13/14 0530 07/14/14 0540 07/16/14 0521 07/17/14 0520 07/19/14 0839  NA 136 136 133* 132* 135  K 3.6 3.6 4.1 4.6 5.0  CL 93* 98 100* 100* 103  CO2 33* _0 GLUCOSE 76 74 117* 109* 80   BUN 66* 63* 55* 55* 42*  CREATININE 4.01* 3.84* 3.14* 3.08* 2.27*  CALCIUM 9.4 9.4 10.2 10.4* 10.8*  AST  --   --  47* 58* 29  ALT  --   --  32 53 50  ALKPHOS  --   --  64 59 61  BILITOT  --   --  0.3 0.3 0.6   ------------------------------------------------------------------------------------------------------------------ estimated creatinine clearance is 21.9 mL/min (by C-G formula based on Cr of 2.27). ------------------------------------------------------------------------------------------------------------------ No results for input(s): HGBA1C in the last 72 hours. ------------------------------------------------------------------------------------------------------------------ No results for input(s): CHOL, HDL, LDLCALC, TRIG, CHOLHDL, LDLDIRECT in the last 72 hours. ------------------------------------------------------------------------------------------------------------------ No results for input(s): TSH, T4TOTAL, T3FREE, THYROIDAB in the last 72 hours.  Invalid input(s): FREET3 ------------------------------------------------------------------------------------------------------------------ No results for input(s): VITAMINB12, FOLATE, FERRITIN, TIBC, IRON, RETICCTPCT in the last 72 hours.  Coagulation profile  Recent Labs Lab 07/16/14 0521 07/17/14 0520  INR 0.95 0.97    No results for input(s): DDIMER in the last 72 hours.  Cardiac Enzymes No results for input(s): CKMB, TROPONINI, MYOGLOBIN in the last 168 hours.  Invalid input(s): CK ------------------------------------------------------------------------------------------------------------------ Invalid input(s): POCBNP     Time Spent in minutes   35   Mazelle Huebert K M.D on 07/19/2014 at 11:50 AM  Between 7am to 7pm - Pager - [phone]  After 7pm go to [email or web address] - password [facility]  [facility]   Office  [phone]

## 2014-07-19 NOTE — Evaluation (Signed)
Physical Therapy Evaluation Patient Details Name: [name] MRN: [id number] DOB: 1941/01/14 Today's Date: 07/19/2014   History of Present Illness  74 yo male admitted with sepsis 2* infected suprapubic catheter, lesions at R ilium and T-11 with cord compression. Hx of COPD, chronic suprapubic catheter. Pt lives in a boarding house.   Pt had illiac biopsy 5/3  Clinical Impression  Pt admitted with above diagnosis. Pt currently with functional limitations due to the deficits listed below (see PT Problem List). Min assist to stand from elevated bed. Pt ambulated 14' with RW and min/guard assist. Distance limited by HR up to 150 with walking. Pt is having frequent loose BMs and was assisted with pericare. SNF recommended. Pt will benefit from skilled PT to increase their independence and safety with mobility to allow discharge to the venue listed below.       Follow Up Recommendations SNF    Equipment Recommendations  None recommended by PT    Recommendations for Other Services       Precautions / Restrictions Precautions Precautions: Fall Restrictions Weight Bearing Restrictions: No      Mobility  Bed Mobility Overal bed mobility: Needs Assistance Bed Mobility: Supine to Sit     Supine to sit: Mod assist Sit to supine: Mod assist   General bed mobility comments: NT- up on EOB  Transfers Overall transfer level: Needs assistance Equipment used: Rolling walker (2 wheeled) Transfers: Sit to/from Stand Sit to Stand: From elevated surface;Min assist         General transfer comment: bed elevated, min A to rise, increased time  Ambulation/Gait Ambulation/Gait assistance: Min guard Ambulation Distance (Feet): 14 Feet Assistive device: Rolling walker (2 wheeled) Gait Pattern/deviations: Step-to pattern;Decreased step length - left;Decreased step length - right;Trunk flexed;Narrow base of support Gait velocity: decreased   General Gait Details: increased time, distance  limited due to HR up to 150 with walking  Stairs            Wheelchair Mobility    Modified Rankin (Stroke Patients Only)       Balance     Sitting balance-Leahy Scale: Good       Standing balance-Leahy Scale: Poor                               Pertinent Vitals/Pain Pain Score: 9  Faces Pain Scale: Hurts even more Pain Location: R hip Pain Descriptors / Indicators: Sore Pain Intervention(s): Repositioned;Limited activity within patient's tolerance;Monitored during session;Premedicated before session    Home Living Family/patient expects to be discharged to:: Skilled nursing facility                 Additional Comments: boarding house    Prior Function Level of Independence: Independent with assistive device(s)         Comments: using walker PTA     Hand Dominance        Extremity/Trunk Assessment   Upper Extremity Assessment: Generalized weakness           Lower Extremity Assessment: RLE deficits/detail;LLE deficits/detail RLE Deficits / Details: pt not willing to attempt knee extension PROM or AAOM in sitting due to R hip pain LLE Deficits / Details: L knee extension 4/5  Cervical / Trunk Assessment: Normal  Communication   Communication: No difficulties  Cognition Arousal/Alertness: Awake/alert Behavior During Therapy: WFL for tasks assessed/performed Overall Cognitive Status: Within Functional Limits for tasks assessed  General Comments      Exercises        Assessment/Plan    PT Assessment Patient needs continued PT services  PT Diagnosis Difficulty walking;Acute pain;Generalized weakness   PT Problem List Decreased strength;Decreased activity tolerance;Decreased balance;Decreased mobility;Decreased knowledge of use of DME;Pain  PT Treatment Interventions DME instruction;Gait training;Functional mobility training;Therapeutic activities;Therapeutic exercise;Patient/family  education;Balance training   PT Goals (Current goals can be found in the Care Plan section) Acute Rehab PT Goals Patient Stated Goal: to go to rehab PT Goal Formulation: With patient Time For Goal Achievement: 08/02/14 Potential to Achieve Goals: Fair    Frequency Min 3X/week   Barriers to discharge Decreased caregiver support      Co-evaluation               End of Session Equipment Utilized During Treatment: Gait belt Activity Tolerance: Patient limited by fatigue;Treatment limited secondary to medical complications (Comment) (increased HR) Patient left: in chair;with call bell/phone within reach Nurse Communication: Mobility status         Time: 1610-9604 PT Time Calculation (min) (ACUTE ONLY): 27 min   Charges:   PT Evaluation $Initial PT Evaluation Tier I: 1 Procedure PT Treatments $Gait Training: 8-22 mins   PT G Codes:        Philomena Doheny 07/19/2014, 1:36 PM [phone]

## 2014-07-19 NOTE — Progress Notes (Signed)
  Radiation Oncology         [phone] ________________________________  Name: [name]  MRN: [id number]  Date: 07/10/2014  DOB: [birth year]  SIMULATION AND TREATMENT PLANNING NOTE    ICD-10-CM  15. 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 Isidore Moos .   The CT images following set up were loaded into the planning software.  The CT study set was fused with the patient's recent MRI for target delineation. Then the target and avoidance structures were contoured.  the targets included metastatic deposits at T10 and T11 within the thoracic spine for stereotactic radiosurgery as well as a larger right iliac metastasis requiring fractionated stereotactic radiation. Treatment planning then occurred.  The radiation prescription was entered and confirmed.    I have requested : 3D Simulation  I have requested a DVH of the following structures: Left kidney, right kidney, spinal cord, esophagus, stomach, small bowel, bladder, right hip, left hip, and target volumes.Marland Kitchen   SPECIAL TREATMENT PROCEDURE:  The planned course of therapy using radiation constitutes a special treatment procedure. Special care is required in the management of this patient for the following reasons. This treatment constitutes a Special Treatment Procedure for the following reason: [ High dose per fraction requiring special monitoring for increased toxicities of treatment including daily imaging..  The special nature of the planned course of radiotherapy will require increased physician supervision and oversight to ensure patient's safety with optimal treatment outcomes.  PLAN:  The patient will receive 18 Gy in one fraction to his T10 and T11 metastases using stereotactic radiosurgery in collaboration with Dr. Vertell Limber from  neurosurgery. He will also receive 40 gray in 5 fractions using stereotactic body radiotherapy to his right iliac bone metastasis for palliation of pain..  ________________________________  Sheral Apley Tammi Klippel, M.D.

## 2014-07-19 NOTE — Evaluation (Signed)
Occupational Therapy Evaluation Patient Details Name: Ebony Rickel MRN: [id number] DOB: 1940/03/31 Today's Date: 07/19/2014    History of Present Illness 74 yo male admitted with sepsis. Hx of COPD, chronic suprapubic catheter. Pt lives in a boarding house. Pt had illiac biopsy 5/3   Clinical Impression   Pt admitted with sepsis. Pt currently with functional limitations due to the deficits listed below (see OT Problem List).  Pt will benefit from skilled OT to increase their safety and independence with ADL and functional mobility for ADL to facilitate discharge to venue listed below.      Follow Up Recommendations  SNF    Equipment Recommendations  None recommended by OT    Recommendations for Other Services       Precautions / Restrictions Precautions Precautions: Fall Restrictions Weight Bearing Restrictions: No      Mobility Bed Mobility Overal bed mobility: Needs Assistance Bed Mobility: Supine to Sit     Supine to sit: Mod assist Sit to supine: Mod assist   General bed mobility comments: increased time and assist with R LE due to pain  Transfers Overall transfer level: Needs assistance Equipment used: Rolling walker (2 wheeled);1 person hand held assist Transfers: Sit to/from Stand Sit to Stand: Max assist                   ADL Overall ADL's : Needs assistance/impaired Eating/Feeding: Set up;Bed level   Grooming: Sitting;Minimal assistance;Cueing for safety   Upper Body Bathing: Minimal assitance;Sitting   Lower Body Bathing: Maximal assistance;Sit to/from stand   Upper Body Dressing : Minimal assistance;Sitting   Lower Body Dressing: Maximal assistance;Sit to/from stand       Toileting- Water quality scientist and Hygiene: Sit to/from stand;Total assistance         General ADL Comments: pt did agree to sit EOB and stand but wanted to get back in bed for breakfast               Pertinent Vitals/Pain Pain Score: 9  Pain Location:  l hip Pain Descriptors / Indicators: Sore Pain Intervention(s): Monitored during session;Repositioned     Hand Dominance     Extremity/Trunk Assessment Upper Extremity Assessment Upper Extremity Assessment: Generalized weakness           Communication Communication Communication: No difficulties   Cognition Arousal/Alertness: Awake/alert Behavior During Therapy: WFL for tasks assessed/performed Overall Cognitive Status: Within Functional Limits for tasks assessed                     General Comments   pt aware he will need rehab            Home Living Family/patient expects to be discharged to:: Skilled nursing facility                                 Additional Comments: boarding house      Prior Functioning/Environment          Comments: using walker PTA    OT Diagnosis: Generalized weakness;Acute pain   OT Problem List: Decreased strength;Decreased activity tolerance;Impaired balance (sitting and/or standing)   OT Treatment/Interventions: Self-care/ADL training;DME and/or AE instruction;Patient/family education    OT Goals(Current goals can be found in the care plan section) Acute Rehab OT Goals Patient Stated Goal: i wanna go to rehab OT Goal Formulation: With patient Time For Goal Achievement: 08/02/14 Potential to Achieve Goals: Good  OT Frequency:  Min 2X/week   Barriers to D/C: Decreased caregiver support             End of Session Nurse Communication: Mobility status  Activity Tolerance: No increased pain Patient left: in bed   Time: 1000-1025 OT Time Calculation (min): 25 min Charges:  OT General Charges $OT Visit: 1 Procedure OT Evaluation $Initial OT Evaluation Tier I: 1 Procedure OT Treatments $Self Care/Home Management : 8-22 mins G-Codes:    Payton Mccallum D Jul 30, 2014, 10:53 AM

## 2014-07-19 NOTE — Progress Notes (Signed)
PT Cancellation Note  Patient Details Name: [name] MRN: [id number] DOB: 10/11/1940   Cancelled Treatment:    Reason Eval/Treat Not Completed: Other (comment) (pt stated he's not in a good state of mind right now, he said he found out this morning he might have cancer and would like PT to attempt later today. Will follow. )   Philomena Doheny 07/19/2014, 11:35 AM [phone]

## 2014-07-19 NOTE — Progress Notes (Signed)
CSW attempted to meet with Pt to choose bed. Pt was provided with bed offer list on 05/03. He has not chosen a bed at this time, but CSW encouraged him to look over the list to make a choice today.  Pt will need radiation, unsure of frequency at the outpatient level.  Peri Maris, LCSWA 07/19/2014 4:04 PM [phone]

## 2014-07-20 ENCOUNTER — Ambulatory Visit: Admission: RE | Admit: 2014-07-20 | Payer: Medicare Other | Source: Ambulatory Visit | Admitting: Radiation Oncology

## 2014-07-20 ENCOUNTER — Ambulatory Visit: Payer: Medicare Other | Admitting: Radiation Oncology

## 2014-07-20 DIAGNOSIS — R531 Weakness: Secondary | ICD-10-CM

## 2014-07-20 DIAGNOSIS — N211 Calculus in urethra: Secondary | ICD-10-CM

## 2014-07-20 DIAGNOSIS — Z7189 Other specified counseling: Secondary | ICD-10-CM

## 2014-07-20 DIAGNOSIS — Z515 Encounter for palliative care: Secondary | ICD-10-CM

## 2014-07-20 LAB — CBC WITH DIFFERENTIAL/PLATELET
Basophils Absolute: 0 10*3/uL (ref 0.0–0.1)
Basophils Relative: 0 % (ref 0–1)
EOS ABS: 0 10*3/uL (ref 0.0–0.7)
EOS PCT: 0 % (ref 0–5)
HCT: 28.1 % — ABNORMAL LOW (ref 39.0–52.0)
Hemoglobin: 8.5 g/dL — ABNORMAL LOW (ref 13.0–17.0)
Lymphocytes Relative: 8 % — ABNORMAL LOW (ref 12–46)
Lymphs Abs: 0.9 10*3/uL (ref 0.7–4.0)
MCH: 21.7 pg — AB (ref 26.0–34.0)
MCHC: 30.2 g/dL (ref 30.0–36.0)
MCV: 71.7 fL — ABNORMAL LOW (ref 78.0–100.0)
MONO ABS: 0.7 10*3/uL (ref 0.1–1.0)
Monocytes Relative: 6 % (ref 3–12)
NEUTROS PCT: 86 % — AB (ref 43–77)
Neutro Abs: 9.7 10*3/uL — ABNORMAL HIGH (ref 1.7–7.7)
PLATELETS: 333 10*3/uL (ref 150–400)
RBC: 3.92 MIL/uL — AB (ref 4.22–5.81)
RDW: 16.2 % — ABNORMAL HIGH (ref 11.5–15.5)
WBC: 11.3 10*3/uL — ABNORMAL HIGH (ref 4.0–10.5)

## 2014-07-20 LAB — COMPREHENSIVE METABOLIC PANEL
ALT: 37 U/L (ref 17–63)
ANION GAP: 9 (ref 5–15)
AST: 18 U/L (ref 15–41)
Albumin: 2.4 g/dL — ABNORMAL LOW (ref 3.5–5.0)
Alkaline Phosphatase: 57 U/L (ref 38–126)
BILIRUBIN TOTAL: 0.5 mg/dL (ref 0.3–1.2)
BUN: 43 mg/dL — AB (ref 6–20)
CHLORIDE: 102 mmol/L (ref 101–111)
CO2: 22 mmol/L (ref 22–32)
Calcium: 10.5 mg/dL — ABNORMAL HIGH (ref 8.9–10.3)
Creatinine, Ser: 2.18 mg/dL — ABNORMAL HIGH (ref 0.61–1.24)
GFR calc Af Amer: 32 mL/min — ABNORMAL LOW (ref >60)
GFR, EST NON AFRICAN AMERICAN: 28 mL/min — AB (ref >60)
Glucose, Bld: 116 mg/dL — ABNORMAL HIGH (ref 70–99)
Potassium: 4.8 mmol/L (ref 3.5–5.1)
Sodium: 133 mmol/L — ABNORMAL LOW (ref 135–145)
Total Protein: 6.8 g/dL (ref 6.5–8.1)

## 2014-07-20 LAB — CALCIUM, IONIZED: Calcium, Ionized, Serum: 6.3 mg/dL — ABNORMAL HIGH (ref 4.5–5.6)

## 2014-07-20 MED ORDER — MORPHINE SULFATE (CONCENTRATE) 10 MG/0.5ML PO SOLN: 10.0000 mg | ORAL | Status: DC | PRN

## 2014-07-20 MED ORDER — LORAZEPAM 2 MG/ML PO CONC: 1.0000 mg | Freq: Four times a day (QID) | ORAL | Status: AC | PRN

## 2014-07-20 MED ORDER — DEXAMETHASONE 4 MG PO TABS: 4.0000 mg | ORAL_TABLET | Freq: Two times a day (BID) | ORAL | Status: AC

## 2014-07-20 MED ORDER — NEPRO/CARBSTEADY PO LIQD: 237.0000 mL | Freq: Three times a day (TID) | ORAL | Status: AC

## 2014-07-20 NOTE — Discharge Summary (Signed)
[name], is a 74 y.o. male  DOB 1940-04-25  MRN [id number].  Admission date:  07/10/2014  Admitting Physician  Oswald Hillock, MD  Discharge Date:  07/20/2014   Primary MD  No primary care provider on file.  Recommendations for primary care physician for things to follow:   CBC BMP and ionized calcium in a week. Outpatient follow-up with oncologist Dr. Benay Spice. Goal of care now comfort as he refused further radiation treatments.  Involve hospice at [facility].   Admission Diagnosis  Hypercalcemia [E83.52] Dehydration [E86.0] Hyperkalemia [E87.5] Hypoglycemia [E16.2] UTI (lower urinary tract infection) [N39.0] Acute on chronic kidney failure [N17.9, N18.9] Sepsis, due to unspecified organism [A41.9]   Discharge Diagnosis  Hypercalcemia [E83.52] Dehydration [E86.0] Hyperkalemia [E87.5] Hypoglycemia [E16.2] UTI (lower urinary tract infection) [N39.0] Acute on chronic kidney failure [N17.9, N18.9] Sepsis, due to unspecified organism [A41.9]    Active Problems:   Renal failure   Metabolic acidosis   UTI (lower urinary tract infection)   Sepsis   Lytic bone lesions on xray      Past Medical History  Diagnosis Date  . COPD (chronic obstructive pulmonary disease)     Past Surgical History  Procedure Laterality Date  . Abdominal surgery    . Radiology with anesthesia N/A 07/18/2014    Procedure: MRI - T-Spine without Contrast;  Surgeon: Medication Radiologist, MD;  Location: [place];  Service: Radiology;  Laterality: N/A;       History of present illness and  Hospital Course:     Kindly see H&P for history of present illness and admission details, please review complete Labs, Consult reports and Test reports for all details in brief  HPI  from the history and physical done on the day of admission  74  y/o ? 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  It was felt that Renal failure was 2/2 to catheter clogging and as such, it was felt that the ESBL in the urine might be a contaminant therefore antibiotics were narrowed to Bactrim DS, Because of bony pain in his R hip with a bruise, He had a hip xray 4/28. This suggested Metastatic lesion to that area-CT scan confirmed large Ishial met + pubic Met + Vertebral met at t11 with cord compression. Oncology consulted, Rad-Onc consulted. Patient started on decadron . IR performed BM biopsy on 07/17/14-preliminary result show squamous cell carcinoma with unknown primary  Hospital Course    1. 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 Oncology following, pathology results system with squamous cell cancer. Currently on Decadron. He was seen by radiation oncology and received one radiation treatment, thereafter patient is adamant that he will not pursue any further radiation treatments as they're making  him go crazy and sick. He appears severely cachectic with extremely poor overall prognosis. I have involved palliative care. With his refusal for further radiation treatments: Care form now on should be comfort directed. He will sewn decided on DO NOT RESUSCITATE status after talking to palliative care. He will be discharged to SNF with palliative care on board. Can follow with oncologist Dr. Betsy Coder one time post discharge.    2. 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 Currently on Decadron, received pamidronate on 07/18/2014. Repeat calcium levels in a week, may follow with his primary oncologist to  Betsy Coder and nephrologist Dr. Posey Pronto in 1-2 weeks.   3. Chronic suprapubic Foley catheter with UTI caused by it versus chronic colonization -as finished treatment with antibiotics. Now off treatment. Catheter was replaced in ER. Has a suprapubic catheter for the last 7 years.   4. Peripheral neuropathy. On Neurontin.   5. 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 Baseline creat close to 2.5, this was due to dehydration, after hydration stable. Renal has signed off.   6. Chronic acidosis due to acute renal failure and sepsis. Resolved after supportive care. Initially required bicarbonate infusion and IV fluids.   7. Anemia of chronic disease. No acute issue.    Discharge Condition: Guarded   Follow UP  Follow-up Information    Follow up with Ulla Potash., MD On 07/26/2014.   Specialty:  Nephrology   Why:  Appointment at 1 PM. You will need labs done one week before this-our office will call you with the details   Contact information:   [address] [address] [phone]       Follow up with Betsy Coder, MD. Schedule an appointment as soon as possible for a visit in 1 week.   Specialty:  Oncology   Contact information:   [place] [address] [phone]         Discharge Instructions  and  Discharge Medications      Discharge Instructions    Discharge instructions    Complete by:  As directed   Follow with Primary MD in 7 days , goal of care comfort  Get CBC, CMP, 2 view Chest X ray checked  By SNF MD next visit.    Activity: As tolerated with Full fall precautions use walker/cane & assistance as needed   Disposition SNF   Diet: Renal  For Heart failure patients - Check your Weight same time everyday, if you gain over 2 pounds, or you develop in leg swelling, experience more shortness of breath or chest pain, call your Primary MD immediately. Follow Cardiac Low Salt Diet and 1.5 lit/day fluid restriction.   On your  next visit with your primary care physician please Get Medicines reviewed and adjusted.   Please request your Prim.MD to go over all Hospital Tests and Procedure/Radiological results at the follow up, please get all Hospital records sent to your Prim MD by signing hospital release before you go home.   If you experience worsening of your admission symptoms, develop shortness of breath, life threatening emergency, suicidal or homicidal thoughts you must seek medical attention immediately by calling 911 or calling your MD immediately  if symptoms less severe.  You Must read complete instructions/literature along with all the possible adverse reactions/side effects for all the Medicines you take and that have been prescribed to you. Take any new Medicines after you have completely understood and accpet all the possible adverse reactions/side effects.  Do not drive, operating heavy machinery, perform activities at heights, swimming or participation in water activities or provide baby sitting services if your were admitted for syncope or siezures until you have seen by Primary MD or a Neurologist and advised to do so again.  Do not drive when taking Pain medications.    Do not take more than prescribed Pain, Sleep and Anxiety Medications  Special Instructions: If you have smoked or chewed Tobacco  in the last 2 yrs please stop smoking, stop any regular Alcohol  and or any Recreational drug use.  Wear Seat belts while driving.   Please note  You were cared for by a hospitalist during your hospital stay. If you have any questions about your discharge medications or the care you received while you were in the hospital after you are discharged, you can call the unit and asked to speak with the hospitalist on call if the hospitalist that took care of you is not available. Once you are discharged, your primary care physician will handle any further medical issues. Please note that NO REFILLS for any  discharge medications will be authorized once you are discharged, as it is imperative that you return to your primary care physician (or establish a relationship with a primary care physician if you do not have one) for your aftercare needs so that they can reassess your need for medications and monitor your lab values.     Increase activity slowly    Complete by:  As directed             Medication List    TAKE these medications        dexamethasone 4 MG tablet  Commonly known as:  DECADRON  Take 1 tablet (4 mg total) by mouth every 12 (twelve) hours.     feeding supplement (NEPRO CARB STEADY) Liqd  Take 237 mLs by mouth 3 (three) times daily between meals.     ferrous sulfate 325 (65 FE) MG tablet  Take 1 tablet (325 mg total) by mouth daily with breakfast.     LORazepam 2 MG/ML concentrated solution  Commonly known as:  ATIVAN  Take 0.5 mLs (1 mg total) by mouth every 6 (six) hours as needed for anxiety.     morphine CONCENTRATE 10 MG/0.5ML Soln concentrated solution  Take 0.5 mLs (10 mg total) by mouth every 3 (three) hours as needed for moderate pain or severe pain.     omeprazole 20 MG capsule  Commonly known as:  PRILOSEC  Take 20 mg by mouth daily.     sodium bicarbonate 650 MG tablet  Take 1 tablet by mouth 2 (two) times daily.     trolamine salicylate 10 % cream  Commonly known as:  ASPERCREME  Apply 1 application topically as needed for muscle pain.          Diet and Activity recommendation: See Discharge Instructions above   Consults obtained - nephrology, oncology, radiation oncology, Pall care   Major procedures and Radiology Reports - PLEASE review detailed and final reports for all details, in brief -    Suprapubic catheter replacement in the ER upon admission   Ct Abdomen Pelvis Wo Contrast  07/14/2014   CLINICAL DATA:  Urinary tract infection.  Renal failure.  EXAM: CT ABDOMEN AND PELVIS WITHOUT CONTRAST  TECHNIQUE: Multidetector CT imaging  of the abdomen and pelvis was performed following the standard protocol without IV contrast.  COMPARISON:  10/08/2006  FINDINGS: Lower chest: Small left  pleural effusion is identified with overlying atelectasis/ consolidation. Mild pleural thickening is identified overlying a small right effusion.  Hepatobiliary: Calcified granulomas identified within the liver and spleen. There is a stone within the gallbladder measuring 7 mm, image 33/series 2.  Pancreas: Neck  Spleen: Splenic granulomas noted.  Adrenals/Urinary Tract: Normal appearance of the adrenal glands. Bilateral hydronephrosis and hydroureter is again noted. There is thickening involving the wall of the right renal collecting system and right ureter. Scarring and cortical volume loss involving the right kidney is noted compatible with chronic pyelonephritis. Gas is identified within the left renal pelvis and left ureter. There is also thickening of the wall of the left renal collecting system and ureter. Urinary bladder is partially collapsed around a suprapubic catheter.  Stomach/Bowel: Moderate distension of the urinary bladder. The small bowel loops have a normal caliber without evidence for bowel obstruction. The colon has a normal caliber without evidence for obstruction.  Vascular/Lymphatic: Calcified atherosclerotic disease involves the abdominal aorta. No aneurysm. Enlarged retroperitoneal lymph nodes identified. Index periaortic node measures 1 cm, image 28/series 2. This is compared with 1.2 cm previously. Index periaortic lymph node measures 9 mm, image 35/series 2. Previously this measured the same.  Reproductive: Prostate gland enlargement is noted.  Other: No free fluid or fluid collections within the abdomen or pelvis.  Musculoskeletal: There is a large destructive lytic lesion involving the right iliac bone and gluteal musculature. This measures 7.3 x 4.8 cm lytic lesion involving the right pubic symphysis measures 3.4 by 4.3 cm. Destructive  bone lesion involving the T11 vertebra measures 3.1 x 2.8 cm. There is tumor extending into the canal and has mass effect upon the cord, image 6 of series 2.  IMPRESSION: 1. Findings are consistent with the clinical history of chronic pyelonephritis and bilateral hydronephrosis. 2. Multi focal lytic bone metastasis. There is a lytic lesion involving the T11 vertebra which appears to extend into the canal and exhibits mass effect upon the spinal cord. 3. Metastatic adenopathy noted within the upper abdomen. 4. Aortic atherosclerosis. 5. Gallstone 6. Small bilateral pleural effusions left greater than right. These results will be called to the ordering clinician or representative by the Radiologist Assistant, and communication documented in the PACS or zVision Dashboard.   Electronically Signed   By: Kerby Moors M.D.   On: 07/14/2014 12:01   Ct Chest Wo Contrast  07/14/2014   CLINICAL DATA:  Shortness of breath. Metastatic colon cancer and acute renal failure.  EXAM: CT CHEST WITHOUT CONTRAST  TECHNIQUE: Multidetector CT imaging of the chest was performed following the standard protocol without IV contrast.  COMPARISON:  None.  FINDINGS: THORACIC INLET/BODY WALL:  No acute abnormality.  MEDIASTINUM:  Normal heart size. No pericardial effusion. No acute vascular abnormality. Calcified mediastinal lymph nodes compatible with remote granulomatous disease. No noncalcified adenopathy.  LUNG WINDOWS:  Volume loss and opacity in the left lower lobe with small pleural effusion. No associated pneumonia. No edema or pneumothorax. The lungs are hyperinflated and there is centrilobular emphysema. There is a 6 mm sub solid nodule in the left upper lobe on image 14. Smaller nodule present in the left upper lobe on image 18. These could be incidental or related to early pulmonary metastatic spread. Calcification in the left posterior costophrenic sulcus is likely a pulmonary granuloma, present by chest x-ray over multiple  years.  UPPER ABDOMEN:  Known hydronephrosis bilaterally, with gas in the left urinary collecting system. Granulomatous changes present in the spleen and  liver. Cholelithiasis. These findings were reported on CT from earlier the same day.  OSSEOUS:  Multiple lytic metastatic lesions, mainly noted in the spine and ribs. The most notable lesion is in the T11 vertebra, involving the right body and pedicle with growth into the spinal canal and probable mass effect on the thecal sac. This was reported on the previous study.  IMPRESSION: 1. Small left pleural effusion with mild atelectasis. 2. Emphysema. 3. Multi focal osseous metastatic disease with T11 tumor infiltrating the spinal canal and deforming/involving the thecal sac. 4. Two subcentimeter pulmonary nodules.   Electronically Signed   By: Monte Fantasia M.D.   On: 07/14/2014 16:41   Mr Thoracic Spine Wo Contrast  07/18/2014   CLINICAL DATA:  History of colonic adenocarcinoma. Status post hemicolectomy in 2006.  EXAM: MRI THORACIC SPINE WITHOUT CONTRAST  TECHNIQUE: Multiplanar, multisequence MR imaging of the thoracic spine was performed. No intravenous contrast was administered.  COMPARISON:  CT chest, abdomen and pelvis 07/14/2014.  FINDINGS: Multiple foci of abnormal marrow signal are identified consistent with metastatic disease. Large deposits are seen posteriorly in the T10 centrally and to the left with extension to the left pedicle and in T11 eccentric to the right where there is involvement of the right pedicle. The lesion in T11 is larger. Multiple lesions in spinous processes are also identified, most conspicuous in T4 and T7. The thoracic cord demonstrates normal size, shape and signal. Small left pleural effusion is identified. A subpleural nodule on the left proximally T5-6 measures 1.2 cm. Atrophic appearing left kidney with signal abnormality correlates with chronic pyelonephritis seen on prior exams.  T10-11: Tumor deposit posteriorly is  identified extending into the left pedicle. There is a small amount of epidural tumor in the left paracentral position indenting the ventral thecal sac. There is no central canal stenosis or cord signal abnormality.  At T11, there is epidural tumor centrally and to the right indenting the ventral thecal sac without causing central canal stenosis or cord signal abnormality. The foramina appear open at both levels.  IMPRESSION: Multifocal osseous metastatic disease. Epidural tumor is seen at T10 and T11 as detailed above. Changes are more prominent at T11 but there is no cord signal abnormality or resultant central canal stenosis.  Small right pleural effusion. Subpleural nodule on the right at approximately T5-6 is likely a metastatic deposit.   Electronically Signed   By: Inge Rise M.D.   On: 07/18/2014 13:23   Mr Lumbar Spine Wo Contrast  07/15/2014   CLINICAL DATA:  Colon carcinoma treated with hemicolectomy. Recent discovery of multiple lytic bone metastasis.  EXAM: MRI LUMBAR SPINE WITHOUT CONTRAST  TECHNIQUE: Multiplanar, multisequence MR imaging of the lumbar spine was performed. No intravenous contrast was administered.  COMPARISON:  CT 07/14/2014  FINDINGS: The scan covers the region from mid T10 through mid S3. The lower portion of the T10 vertebral body shows a metastasis within the posterior central vertebral body, possibly with early epidural extension to the left of midline.  At T11, there is more extensive metastatic disease within the vertebral body extending into the right pedicle. There is some extraosseous extension, encroaching upon the epidural space but not compressing the cord, which is surrounded by ample subarachnoid fluid. Some tumor encroachment upon the intervertebral foramen on the right at T10-11 and on the right at T11-12 is noted.  No lesions seen at T12, L1, L2, L3 or L4.  At L5, there is metastasis within the right transverse process. No  compromise of the neural spaces. No  metastasis seen in the upper sacrum. There is a large lesion of the right iliac bone, not completely imaged, measuring up to 7.3 cm in diameter, with extraosseous extension superficially.  There are changes of mild degenerative disc disease with insignificant disc bulges at L3-4, L4-5 and L5-S1. No neural compression. Chronic endplate marrow changes are present in the lower lumbar spine.  IMPRESSION: Metastatic disease affecting the right side of the T11 vertebral body extending into the right pedicle. Early epidural extension of tumor, without cord compression. Early encroachment upon the intervertebral foramen on the right at T10-11 and T11-12.  Metastatic disease seen within the inferior aspect of the T10 vertebral body. That level was not completely image to. Metastatic disease within the right transverse process of L5. Large metastatic lesion within the right iliac bone.   Electronically Signed   By: Nelson Chimes M.D.   On: 07/15/2014 17:55   Ct Biopsy  07/17/2014   CLINICAL DATA:  Multiple lytic bone lesions.  No known primary.  EXAM: CT GUIDED CORE BIOPSY OF RIGHT ILIAC BONE LESION  ANESTHESIA/SEDATION: Intravenous Fentanyl and Versed were administered as conscious sedation during continuous cardiorespiratory monitoring by the radiology RN, with a total moderate sedation time of 4 minutes.  PROCEDURE: The procedure risks, benefits, and alternatives were explained to the patient. Questions regarding the procedure were encouraged and answered. The patient understands and consents to the procedure.  Patient placed prone. Limited axial scans through the pelvis were obtained and the lytic right iliac lesion was identified. An appropriate skin entry site was determined and marked.  The operative field was prepped with Betadinein a sterile fashion, and a sterile drape was applied covering the operative field. A sterile gown and sterile gloves were used for the procedure. Local anesthesia was provided with 1%  Lidocaine.  Under CT fluoroscopic guidance, a 17 gauge trocar needle was advanced to the margin of the lesion. Once needle tip position was confirmed, coaxial 18-gauge core biopsy samples were obtained, submitted in saline to surgical pathology. The guide needle was removed.  Postprocedure scans show no hemorrhage or other apparent complication. The patient tolerated the procedure well.  COMPLICATIONS: None immediate  FINDINGS: Limited imaging confirms a large lytic lesion in the right iliac bone at the level of the SI joint, with an exophytic soft tissue component. Core biopsy samples were obtained as above.  IMPRESSION: 1. Technically successful CT-guided deep bone core biopsy of lytic right iliac lesion.   Electronically Signed   By: Lucrezia Europe M.D.   On: 07/17/2014 11:05   Dg Chest Port 1 View  07/10/2014   CLINICAL DATA:  Weakness.  Urinary incontinence  EXAM: PORTABLE CHEST - 1 VIEW  COMPARISON:  10/06/2013  FINDINGS: Hyperinflation and possible emphysematous change. There are bilateral nipple shadows which are symmetric. There is no edema, consolidation, effusion, or pneumothorax. Normal heart size and aortic contours.  IMPRESSION: COPD without acute superimposed disease.   Electronically Signed   By: Monte Fantasia M.D.   On: 07/10/2014 14:02   Dg Hip Unilat With Pelvis 2-3 Views Right  07/12/2014   CLINICAL DATA:  Right lateral hip pain x2 weeks, no no known injury  EXAM: RIGHT HIP (WITH PELVIS) 2-3 VIEWS  COMPARISON:  None.  FINDINGS: No fracture or dislocation is seen.  Mild degenerative changes of the bilateral hips.  Visualized bony pelvis appears intact.  IMPRESSION: No fracture or dislocation is seen.  Mild degenerative changes of the  bilateral hips.   Electronically Signed   By: Julian Hy M.D.   On: 07/12/2014 11:42   Dg Hips Bilat With Pelvis 3-4 Views  07/13/2014   CLINICAL DATA:  Right hip pain.  No known injury.  EXAM: BILATERAL HIP (WITH PELVIS) 3-4 VIEWS  COMPARISON:   07/12/2014  FINDINGS: There is a destructive lesion in the lateral aspect of the right pubic body. There is suggestion of a lytic lesion in the right ilium adjacent to the inferior aspect of the right sacroiliac joint. On the lateral view of the right hip there is a 1 cm lucent area in the proximal right femoral shaft which may be a lytic lesion. There is slight lucency at the superior lateral aspect of right acetabulum which could represent a lytic lesion.  IMPRESSION: Lytic lesions of the right pubic body and right ilium. This could represent metastatic disease. 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   Electronically Signed   By: Lorriane Shire M.D.   On: 07/13/2014 15:53    Micro Results      Recent Results (from the past 240 hour(s))  Blood culture (routine x 2)     Status: None   Collection Time: 07/10/14 12:58 PM  Result Value Ref Range Status   Specimen Description BLOOD LEFT ARM  Final   Special Requests BOTTLES DRAWN AEROBIC ONLY 10CC  Final   Culture   Final    NO GROWTH 5 DAYS Performed at [facility]    Report Status 07/16/2014 FINAL  Final  Blood culture (routine x 2)     Status: None   Collection Time: 07/10/14  2:59 PM  Result Value Ref Range Status   Specimen Description BLOOD LEFT WRIST  Final   Special Requests BOTTLES DRAWN AEROBIC ONLY 3CC  Final   Culture   Final    NO GROWTH 5 DAYS Performed at [facility]    Report Status 07/16/2014 FINAL  Final  Urine culture     Status: None   Collection Time: 07/10/14  4:10 PM  Result Value Ref Range Status   Specimen Description URINE, RANDOM  Final   Special Requests NONE  Final   Colony Count   Final    >=100,000 COLONIES/ML Performed at [facility]    Culture   Final    ESCHERICHIA COLI Note: Confirmed Extended Spectrum Beta-Lactamase Producer (ESBL) Note: CRITICAL RESULT CALLED TO, READ BACK BY AND VERIFIED WITH: BETTY JONES AT 5:40 A.M. ON 07/13/2014  WARRB Performed at [facility]    Report Status 07/13/2014 FINAL  Final   Organism ID, Bacteria ESCHERICHIA COLI  Final      Susceptibility   Escherichia coli - MIC*    AMPICILLIN >=32 RESISTANT Resistant     CEFAZOLIN >=64 RESISTANT Resistant     CEFTRIAXONE >=64 RESISTANT Resistant     CIPROFLOXACIN >=4 RESISTANT Resistant     GENTAMICIN <=1 SENSITIVE Sensitive     LEVOFLOXACIN >=8 RESISTANT Resistant     NITROFURANTOIN 32 SENSITIVE Sensitive     TOBRAMYCIN <=1 SENSITIVE Sensitive     TRIMETH/SULFA <=20 SENSITIVE Sensitive     IMIPENEM <=0.25 SENSITIVE Sensitive     PIP/TAZO <=4 SENSITIVE Sensitive     * ESCHERICHIA COLI  MRSA PCR Screening     Status: None   Collection Time: 07/10/14  4:10 PM  Result Value Ref Range Status   MRSA by PCR NEGATIVE NEGATIVE Final  Comment:        The GeneXpert MRSA Assay (FDA approved for NASAL specimens only), is one component of a comprehensive MRSA colonization surveillance program. It is not intended to diagnose MRSA infection nor to guide or monitor treatment for MRSA infections.   Clostridium Difficile by PCR     Status: None   Collection Time: 07/12/14  9:18 AM  Result Value Ref Range Status   C difficile by pcr NEGATIVE NEGATIVE Final       Today   Subjective:   [name] today has no headache,no chest abdominal pain,no new weakness tingling or numbness, feels much better wants to go home today.   Objective:   Blood pressure 129/69, pulse 88, temperature 98.6 F (37 C), temperature source Oral, resp. rate 18, height _0  (1.956 m), weight 55 kg (121 lb 4.1 oz), SpO2 100 %.   Intake/Output Summary (Last 24 hours) at 07/20/14 0914 Last data filed at 07/20/14 0645  Gross per 24 hour  Intake    240 ml  Output   1500 ml  Net  -1260 ml    Exam Awake Alert, Oriented x 3, No new F.N deficits, Normal affect, severely cachectic African-American male [place].AT,PERRAL Supple Neck,No JVD, No cervical  lymphadenopathy appriciated.  Symmetrical Chest wall movement, Good air movement bilaterally, CTAB RRR,No Gallops,Rubs or new Murmurs, No Parasternal Heave +ve B.Sounds, Abd Soft, Non tender, No organomegaly appriciated, No rebound -guarding or rigidity. Suprapubic catheter in place No Cyanosis, Clubbing or edema, No new Rash or bruise  Data Review   CBC w Diff: Lab Results  Component Value Date   WBC 11.3* 07/20/2014   HGB 8.5* 07/20/2014   HCT 28.1* 07/20/2014   PLT 333 07/20/2014   LYMPHOPCT 8* 07/20/2014   MONOPCT 6 07/20/2014   EOSPCT 0 07/20/2014   BASOPCT 0 07/20/2014    CMP: Lab Results  Component Value Date   NA 133* 07/20/2014   K 4.8 07/20/2014   CL 102 07/20/2014   CO2 22 07/20/2014   BUN 43* 07/20/2014   CREATININE 2.18* 07/20/2014   PROT 6.8 07/20/2014   ALBUMIN 2.4* 07/20/2014   BILITOT 0.5 07/20/2014   ALKPHOS 57 07/20/2014   AST 18 07/20/2014   ALT 37 07/20/2014  .   Total Time in preparing paper work, data evaluation and todays exam - 35 minutes  Thurnell Lose M.D on 07/20/2014 at 9:14 AM  [facility]   Office  [phone]

## 2014-07-20 NOTE — Progress Notes (Signed)
Pt. escorted to awaiting vehicle for transport to [facility] via wheelchair in stable condition. Pt. Belongings retrieved from security and given to pt. D/c instructions with packet including prescriptions given to patient to be given to [facility]. [facility] notified of patient's d/c.

## 2014-07-20 NOTE — Care Management Note (Signed)
Case Management Note  Patient Details  Name: [name] MRN: [id number] Date of Birth: 12-21-1940  Subjective/Objective:    74 yo male admitted with sepsis                Action/Plan:   Expected Discharge Date:   (unknown)               Expected Discharge Plan:  Skilled Nursing Facility  In-House Referral:  Clinical Social Work  Discharge planning Services  CM Consult  Post Acute Care Choice:  NA Choice offered to:  NA  DME Arranged:    DME Agency:     HH Arranged:    [place] Agency:     Status of Service:  Completed, signed off  Medicare Important Message Given:  Yes Date Medicare IM Given:  07/20/14 Medicare IM give by:  07/20/14 Date Additional Medicare IM Given:    Additional Medicare Important Message give by:     If discussed at [place] of Stay Meetings, dates discussed:    Additional Comments:  Scot Dock, RN 07/20/2014, 9:30 AM

## 2014-07-20 NOTE — Discharge Instructions (Signed)
Follow with Primary MD in 7 days , goal of care comfort  Get CBC, CMP, 2 view Chest X ray checked  By SNF MD next visit.    Activity: As tolerated with Full fall precautions use walker/cane & assistance as needed   Disposition SNF   Diet: Renal  For Heart failure patients - Check your Weight same time everyday, if you gain over 2 pounds, or you develop in leg swelling, experience more shortness of breath or chest pain, call your Primary MD immediately. Follow Cardiac Low Salt Diet and 1.5 lit/day fluid restriction.   On your next visit with your primary care physician please Get Medicines reviewed and adjusted.   Please request your Prim.MD to go over all Hospital Tests and Procedure/Radiological results at the follow up, please get all Hospital records sent to your Prim MD by signing hospital release before you go home.   If you experience worsening of your admission symptoms, develop shortness of breath, life threatening emergency, suicidal or homicidal thoughts you must seek medical attention immediately by calling 911 or calling your MD immediately  if symptoms less severe.  You Must read complete instructions/literature along with all the possible adverse reactions/side effects for all the Medicines you take and that have been prescribed to you. Take any new Medicines after you have completely understood and accpet all the possible adverse reactions/side effects.   Do not drive, operating heavy machinery, perform activities at heights, swimming or participation in water activities or provide baby sitting services if your were admitted for syncope or siezures until you have seen by Primary MD or a Neurologist and advised to do so again.  Do not drive when taking Pain medications.    Do not take more than prescribed Pain, Sleep and Anxiety Medications  Special Instructions: If you have smoked or chewed Tobacco  in the last 2 yrs please stop smoking, stop any regular Alcohol  and  or any Recreational drug use.  Wear Seat belts while driving.   Please note  You were cared for by a hospitalist during your hospital stay. If you have any questions about your discharge medications or the care you received while you were in the hospital after you are discharged, you can call the unit and asked to speak with the hospitalist on call if the hospitalist that took care of you is not available. Once you are discharged, your primary care physician will handle any further medical issues. Please note that NO REFILLS for any discharge medications will be authorized once you are discharged, as it is imperative that you return to your primary care physician (or establish a relationship with a primary care physician if you do not have one) for your aftercare needs so that they can reassess your need for medications and monitor your lab values.

## 2014-07-20 NOTE — Progress Notes (Signed)
CSW assisting with d/c planning. Pt has chosen [name] for placement. D/C summary has been faxed to SNF for review. Luiz Iron Director, is aware MD has recommended Hospice / Palliative care services at [facility]. Pt is requesting to have a friend provide transportation. Scripts have been included in D/C packet. [facility] will provide d/c packet to pt prior to d/c.  Werner Lean LCSW [phone]

## 2014-07-20 NOTE — Progress Notes (Signed)
IP PROGRESS NOTE  Subjective:   He reports improvement in the right hip discomfort. He underwent radiation treatment planning yesterday. [name] plans to return to the "boarding house ".  Objective: Vital signs in last 24 hours: Blood pressure 129/69, pulse 88, temperature 98.6 F (37 C), temperature source Oral, resp. rate 18, height '6\' 5"'$  (1.956 m), weight 121 lb 4.1 oz (55 kg), SpO2 100 %.  Intake/Output from previous day: 05/04 0701 - 05/05 0700 In: 240 [P.O.:240] Out: 1500 [Urine:1500]  Physical Exam: HEENT: Oropharynx without visible mass Neurologic: Better movement of the right leg   Lab Results:  Recent Labs  07/19/14 0535 07/20/14 0525  WBC 10.8* 11.3*  HGB 9.0* 8.5*  HCT 30.7* 28.1*  PLT 389 333    BMET  Recent Labs  07/19/14 0839 07/20/14 0525  NA 135 133*  K 5.0 4.8  CL 103 102  CO2 24 22  GLUCOSE 80 116*  BUN 42* 43*  CREATININE 2.27* 2.18*  CALCIUM 10.8* 10.5*    Studies/Results: No results found.  Medications: I have reviewed the patient's current medications.  Assessment/Plan:  1. Multiple lytic bone lesions with a soft-tissue component, status post a CT-guided biopsy of a right iliac mass 07/17/2014 confirming metastatic squamous cell carcinoma.  2. Pain secondary to destructive lesion at the right iliac-improved with Decadron  3. Escherichia coli urinary tract infection/bacteremia  4. Chronic renal failure  5. Chronic microcytic anemia  6. History of stage I colon cancer December 2006  7. COPD  8. Urethral obstruction, status post placement of a suprapubic catheter  9. Hypercalcemia-pamidronate given on 07/18/2014  [name] reports improvement in the right iliac pain. No new complaint. The biopsy confirmed metastatic squamous cell carcinoma. There is no apparent primary tumor site. The differential diagnosis includes head and neck and an upper gastrointestinal primary. It is also possible he has a primary tumor involving  the GU system. I discussed the prognosis and treatment options with him. No therapy will be curative. [name] has a poor performance status at present. I doubt he will be a candidate for systemic chemotherapy given this and his chronic renal failure. I discussed the case with Dr. Tammi Klippel. The plan is to proceed with palliative radiation to the thoracic spine and right iliac. I will arrange for outpatient follow-up at the [facility].  We will consider additional staging with an ENT referral, urology referral, and PET scan, If his performance status improves following radiation.  Recommendations: 1. Proceed with palliative radiation to the thoracic spine and right iliac 2. Continue Decadron, taper per Dr. Tammi Klippel 3. Outpatient follow-up at the [facility] in approximately 2 weeks   LOS: 10 days   [place]  07/20/2014, 11:45 AM

## 2014-07-20 NOTE — Progress Notes (Signed)
Pt refused Radiation treatment today.

## 2014-07-20 NOTE — Progress Notes (Signed)
Report given to [place] at [facility].

## 2014-07-20 NOTE — Consult Note (Signed)
Consultation Note Date: 07/20/2014   Patient Name: [name]  DOB: 01-23-1941  MRN: [id number]  Age / Sex: 74 y.o., male   PCP: No primary care provider on file. Referring Physician: Thurnell Lose, MD  Reason for Consultation: Establishing goals of care  Palliative Care Assessment and Plan Summary of Established Goals of Care and Medical Treatment Preferences    Palliative Care Discussion Held Today:  This NP Wadie Lessen reviewed medical records, received report from team, assessed the patient and then meet at the patient's bedside  to discuss diagnosis, prognosis, GOC, EOL wishes disposition and options.   A brief discussion was had today regarding advanced directives/pateitn is being dc today.  Concepts specific to code status was had.  The difference between a aggressive medical intervention path and a palliative comfort care path for this patient at this time was discussed.  Values and goals of care important to patient  were attempted to be elicited.  Concept of Hospice and Palliative Care were discussed  Questions and concerns addressed.      Contacts/Participants in Discussion: Primary Decision Maker: self      HCPOA: no   Code Status/Advance Care Planning:  DNR/DNI-documented today  Symptom Management:  Weakness: treat the treatable/PT as tolerated/ dc to SNF for rehab  Refuses any further radiation treatment   Psycho-social/Spiritual:   Support System: limited/ neighbors only  Desire for further Chaplaincy support:no  Prognosis: < 6 months  Discharge Planning:  [place] for rehab with Palliative care service follow-up       Chief Complaint/History of Present Illness: [name] 74 y/o ? 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 Oncology following, pathology results system with squamous cell cancer. Currently on Decadron. He was seen by radiation oncology and received one radiation treatment, thereafter patient is adamant that he will not pursue any further radiation treatments as they're making him go crazy and sick. He appears severely cachectic with extremely poor overall prognosis. With his refusal for further radiation treatments: He will be discharged to SNF with palliative care on board. Can follow with oncologist Dr. Betsy Coder one time post discharge.    2. 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 Currently on Decadron, received pamidronate on 07/18/2014. Repeat calcium levels in a week, may follow with his primary oncologist to Betsy Coder and nephrologist Dr. Posey Pronto in 1-2 weeks.   3. Chronic suprapubic Foley catheter with UTI caused by it versus chronic colonization -as finished treatment with antibiotics. Now off treatment. Catheter was replaced in ER. Has a suprapubic catheter for the last 7 years.   4. Peripheral neuropathy. On Neurontin.   5. 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 Baseline creat close to 2.5, this was due to dehydration, after hydration stable. Renal has signed off.   6. Chronic acidosis due to acute renal failure and sepsis. Resolved after supportive care. Initially required bicarbonate infusion and IV fluids.   7. Anemia of chronic disease. No acute issue.       Primary Diagnoses  Present on Admission:  . Sepsis . Renal failure . Metabolic acidosis . UTI (lower urinary tract  infection)  Palliative Review of Systems: -weakness, fatigue, weight loss, dyspnea I have reviewed the medical record, interviewed the patient and family, and examined the patient. The following aspects are  pertinent.  Past Medical History  Diagnosis Date  . COPD (chronic obstructive pulmonary disease)    History   Social History  . Marital Status: Single    Spouse Name: N/A  . Number of Children: N/A  . Years of Education: N/A   Social History Main Topics  . Smoking status: Current Every Day Smoker  . Smokeless tobacco: Not on file  . Alcohol Use: Yes  . Drug Use: Not on file  . Sexual Activity: Not on file   Other Topics Concern  . None   Social History Narrative   History reviewed. No pertinent family history. Scheduled Meds: . darbepoetin (ARANESP) injection - NON-DIALYSIS  100 mcg Subcutaneous Q Wed-1800  . dexamethasone  4 mg Oral Q12H  . feeding supplement (NEPRO CARB STEADY)  237 mL Oral TID BM  . heparin  5,000 Units Subcutaneous 3 times per day   Continuous Infusions: . lactated ringers 20 mL/hr at 07/18/14 0709   PRN Meds:.acetaminophen **OR** acetaminophen, HYDROcodone-acetaminophen, HYDROmorphone (DILAUDID) injection, MUSCLE RUB, ondansetron **OR** ondansetron (ZOFRAN) IV, zolpidem Medications Prior to Admission:  Prior to Admission medications   Medication Sig Start Date End Date Taking? Authorizing Provider  ferrous sulfate 325 (65 FE) MG tablet Take 1 tablet (325 mg total) by mouth daily with breakfast. 10/10/13  Yes Kinnie Feil, MD  omeprazole (PRILOSEC) 20 MG capsule Take 20 mg by mouth daily.   Yes Historical Provider, MD  sodium bicarbonate 650 MG tablet Take 1 tablet by mouth 2 (two) times daily. 05/24/14  Yes Historical Provider, MD  trolamine salicylate (ASPERCREME) 10 % cream Apply 1 application topically as needed for muscle pain.   Yes Historical Provider, MD  dexamethasone (DECADRON) 4 MG tablet Take 1 tablet (4 mg total) by mouth every 12 (twelve) hours. 07/20/14   Thurnell Lose, MD  LORazepam (ATIVAN) 2 MG/ML concentrated solution Take 0.5 mLs (1 mg total) by mouth every 6 (six) hours as needed for anxiety. 07/20/14   Thurnell Lose, MD   Morphine Sulfate (MORPHINE CONCENTRATE) 10 MG/0.5ML SOLN concentrated solution Take 0.5 mLs (10 mg total) by mouth every 3 (three) hours as needed for moderate pain or severe pain. 07/20/14   Thurnell Lose, MD  Nutritional Supplements (FEEDING SUPPLEMENT, NEPRO CARB STEADY,) LIQD Take 237 mLs by mouth 3 (three) times daily between meals. 07/20/14   Thurnell Lose, MD   No Known Allergies CBC:    Component Value Date/Time   WBC 11.3* 07/20/2014 0525   HGB 8.5* 07/20/2014 0525   HCT 28.1* 07/20/2014 0525   PLT 333 07/20/2014 0525   MCV 71.7* 07/20/2014 0525   NEUTROABS 9.7* 07/20/2014 0525   LYMPHSABS 0.9 07/20/2014 0525   MONOABS 0.7 07/20/2014 0525   EOSABS 0.0 07/20/2014 0525   BASOSABS 0.0 07/20/2014 0525   Comprehensive Metabolic Panel:    Component Value Date/Time   NA 133* 07/20/2014 0525   K 4.8 07/20/2014 0525   CL 102 07/20/2014 0525   CO2 22 07/20/2014 0525   BUN 43* 07/20/2014 0525   CREATININE 2.18* 07/20/2014 0525   GLUCOSE 116* 07/20/2014 0525   CALCIUM 10.5* 07/20/2014 0525   AST 18 07/20/2014 0525   ALT 37 07/20/2014 0525   ALKPHOS 57 07/20/2014 0525   BILITOT 0.5 07/20/2014 0525   PROT 6.8  07/20/2014 0525   ALBUMIN 2.4* 07/20/2014 0525    Physical Exam: Vital Signs: BP 132/71 mmHg  Pulse 85  Temp(Src) 98.4 F (36.9 C) (Oral)  Resp 20  Ht '6\' 5"'$  (1.956 m)  Wt 55 kg (121 lb 4.1 oz)  BMI 14.38 kg/m2  SpO2 99% SpO2: SpO2: 99 % O2 Device: O2 Device: Not Delivered O2 Flow Rate: O2 Flow Rate (L/min): 2 L/min Intake/output summary:  Intake/Output Summary (Last 24 hours) at 07/20/14 1320 Last data filed at 07/20/14 0645  Gross per 24 hour  Intake      0 ml  Output   1500 ml  Net  -1500 ml   LBM:   Baseline Weight: Weight: 52.164 kg (115 lb) Most recent weight: Weight: 55 kg (121 lb 4.1 oz)  Exam Findings:  General: frail cachectic, alert and oriented X3  HEENT:moist buccal membranes, no exudate noted CVS: RRR Resp: CTA Abd: soft NT +BS           Palliative Performance Scale: 30 % at best              Additional Data Reviewed: Recent Labs     07/19/14  0535  07/19/14  0839  07/20/14  0525  WBC  10.8*   --   11.3*  HGB  9.0*   --   8.5*  PLT  389   --   333  NA   --   135  133*  BUN   --   42*  43*  CREATININE   --   2.27*  2.18*     Time In: 1100 Time Out: 1200 Time Total:  60 min  Greater than 50%  of this time was spent counseling and coordinating care related to the above assessment and plan.  Signed by: Wadie Lessen, NP  [place]ox Royalty, NP  07/20/2014, 1:20 PM  Please contact Palliative Medicine Team phone at [phone] for questions and concerns

## 2014-07-21 ENCOUNTER — Other Ambulatory Visit: Payer: Self-pay | Admitting: *Deleted

## 2014-07-21 DIAGNOSIS — C7949 Secondary malignant neoplasm of other parts of nervous system: Secondary | ICD-10-CM

## 2014-07-21 NOTE — Clinical Social Work Placement (Signed)
   CLINICAL SOCIAL WORK PLACEMENT  NOTE  Date:  07/21/2014  Patient Details  Name: [name] MRN: [id number] Date of Birth: 09-28-1940  Clinical Social Work is seeking post-discharge placement for this patient at the [place] level of care (*CSW will initial, date and re-position this form in  chart as items are completed):  Yes   Patient/family provided with [place] Work Department's list of facilities offering this level of care within the geographic area requested by the patient (or if unable, by the patient's family).  Yes   Patient/family informed of their freedom to choose among providers that offer the needed level of care, that participate in Medicare, Medicaid or managed care program needed by the patient, have an available bed and are willing to accept the patient.  Yes   Patient/family informed of [facility]'s ownership interest in [facility] and [facility], as well as of the fact that they are under no obligation to receive care at these facilities.  PASRR submitted to EDS on 07/13/14     PASRR number received on 07/13/14     Existing PASRR number confirmed on       FL2 transmitted to all facilities in geographic area requested by pt/family on 07/13/14     FL2 transmitted to all facilities within larger geographic area on       Patient informed that his/her managed care company has contracts with or will negotiate with certain facilities, including the following:         07/19/14   Patient/family informed of bed offers received.  Patient chooses bed at    [facility]   Physician recommends and patient chooses bed at      Patient to be transferred to   [facility] on  .07/20/14  Patient to be transferred to facility by  car. Patient's friend notified on  07/20/14 of transfer.  Name of family member notified:   Pt contacted friend directly .     PHYSICIAN       Additional Comment:     _______________________________________________ Luretha Rued, LCSW [phone] 07/21/2014, 11:42 AM

## 2014-07-24 ENCOUNTER — Telehealth: Payer: Self-pay | Admitting: Oncology

## 2014-07-24 ENCOUNTER — Ambulatory Visit: Payer: Medicare Other | Admitting: Radiation Oncology

## 2014-07-24 DIAGNOSIS — R531 Weakness: Secondary | ICD-10-CM

## 2014-07-24 DIAGNOSIS — Z515 Encounter for palliative care: Secondary | ICD-10-CM

## 2014-07-24 DIAGNOSIS — C801 Malignant (primary) neoplasm, unspecified: Secondary | ICD-10-CM | POA: Insufficient documentation

## 2014-07-24 NOTE — Telephone Encounter (Signed)
Contacted [facility] and gave hosp f/u appt for 05/11 @ 10:45.

## 2014-07-25 ENCOUNTER — Encounter: Payer: Self-pay | Admitting: Radiation Oncology

## 2014-07-25 ENCOUNTER — Other Ambulatory Visit: Payer: Self-pay | Admitting: *Deleted

## 2014-07-25 MED FILL — Phenylephrine-NaCl Pref Syr 0.4 MG/10ML-0.9% (40 MCG/ML): INTRAVENOUS | Qty: 10 | Status: AC

## 2014-07-25 MED FILL — Propofol IV Emul 200 MG/20ML (10 MG/ML): INTRAVENOUS | Qty: 20 | Status: AC

## 2014-07-25 MED FILL — Lidocaine HCl IV Inj 20 MG/ML: INTRAVENOUS | Qty: 5 | Status: AC

## 2014-07-25 MED FILL — Fentanyl Citrate Preservative Free (PF) Inj 100 MCG/2ML: INTRAMUSCULAR | Qty: 2 | Status: AC

## 2014-07-26 ENCOUNTER — Ambulatory Visit: Payer: Medicare Other

## 2014-07-26 ENCOUNTER — Inpatient Hospital Stay: Payer: Medicare Other | Admitting: Oncology

## 2014-07-26 ENCOUNTER — Ambulatory Visit: Payer: Medicare Other | Admitting: Radiation Oncology

## 2014-07-26 ENCOUNTER — Other Ambulatory Visit: Payer: Medicare Other

## 2014-07-27 ENCOUNTER — Encounter: Payer: Self-pay | Admitting: Radiation Oncology

## 2014-07-28 ENCOUNTER — Ambulatory Visit: Payer: Medicare Other | Admitting: Radiation Oncology

## 2014-08-01 ENCOUNTER — Ambulatory Visit: Payer: Medicare Other | Admitting: Radiation Oncology

## 2014-08-02 ENCOUNTER — Encounter: Payer: Self-pay | Admitting: Radiation Therapy

## 2014-08-02 NOTE — Progress Notes (Signed)
Per [name]'s nurse at [facility], he has decided to go with comfort care measures only. No radiation treatment to Thoracic spine or  Rt Iliac bone.   [name]

## 2014-08-03 ENCOUNTER — Ambulatory Visit: Payer: Medicare Other | Admitting: Radiation Oncology

## 2014-08-04 ENCOUNTER — Other Ambulatory Visit: Payer: Self-pay | Admitting: *Deleted

## 2014-08-04 DIAGNOSIS — C7949 Secondary malignant neoplasm of other parts of nervous system: Secondary | ICD-10-CM

## 2014-08-07 ENCOUNTER — Telehealth: Payer: Self-pay | Admitting: Oncology

## 2014-08-07 NOTE — Telephone Encounter (Signed)
s.w. pt and advised on appt....wife did not want to sched at this time

## 2014-08-11 ENCOUNTER — Other Ambulatory Visit: Payer: Medicare Other

## 2014-08-11 ENCOUNTER — Inpatient Hospital Stay: Payer: Medicare Other | Admitting: Nurse Practitioner

## 2014-08-22 NOTE — Progress Notes (Signed)
  Radiation Oncology         [phone] ________________________________  Name: [name]  MRN: [id number]  Date: 07/25/2014  DOB: [birth year]  Chart Note:  This note is intended to communicate that this gentleman was originally plans to receive radiation treatment after consenting to and agreement to proceed. His radiation planning was performed. Prior to receiving treatment, he elected to forego further interventions and enroll in hospice.   Radiation planning was performed.  ________________________________  Sheral Apley. Tammi Klippel, M.D.

## 2014-08-23 ENCOUNTER — Encounter (HOSPITAL_COMMUNITY): Payer: Self-pay | Admitting: Family Medicine

## 2014-08-23 ENCOUNTER — Inpatient Hospital Stay (HOSPITAL_COMMUNITY)
Admission: EM | Admit: 2014-08-23 | Discharge: 2014-08-28 | DRG: 871 | Disposition: A | Discharge status: Skilled Nursing Facility | Payer: Medicare Other | Attending: Family Medicine | Admitting: Family Medicine

## 2014-08-23 ENCOUNTER — Emergency Department (HOSPITAL_COMMUNITY): Payer: Medicare Other

## 2014-08-23 DIAGNOSIS — K219 Gastro-esophageal reflux disease without esophagitis: Secondary | ICD-10-CM | POA: Diagnosis present

## 2014-08-23 DIAGNOSIS — E876 Hypokalemia: Secondary | ICD-10-CM | POA: Diagnosis not present

## 2014-08-23 DIAGNOSIS — N184 Chronic kidney disease, stage 4 (severe): Secondary | ICD-10-CM | POA: Diagnosis present

## 2014-08-23 DIAGNOSIS — N10 Acute tubulo-interstitial nephritis: Secondary | ICD-10-CM | POA: Diagnosis present

## 2014-08-23 DIAGNOSIS — C7951 Secondary malignant neoplasm of bone: Secondary | ICD-10-CM | POA: Diagnosis present

## 2014-08-23 DIAGNOSIS — E875 Hyperkalemia: Secondary | ICD-10-CM | POA: Diagnosis present

## 2014-08-23 DIAGNOSIS — I2510 Atherosclerotic heart disease of native coronary artery without angina pectoris: Secondary | ICD-10-CM | POA: Diagnosis present

## 2014-08-23 DIAGNOSIS — Z923 Personal history of irradiation: Secondary | ICD-10-CM

## 2014-08-23 DIAGNOSIS — I5022 Chronic systolic (congestive) heart failure: Secondary | ICD-10-CM | POA: Diagnosis present

## 2014-08-23 DIAGNOSIS — E162 Hypoglycemia, unspecified: Secondary | ICD-10-CM | POA: Diagnosis present

## 2014-08-23 DIAGNOSIS — Z681 Body mass index (BMI) 19 or less, adult: Secondary | ICD-10-CM

## 2014-08-23 DIAGNOSIS — B9620 Unspecified Escherichia coli [E. coli] as the cause of diseases classified elsewhere: Secondary | ICD-10-CM | POA: Diagnosis present

## 2014-08-23 DIAGNOSIS — R52 Pain, unspecified: Secondary | ICD-10-CM | POA: Insufficient documentation

## 2014-08-23 DIAGNOSIS — C189 Malignant neoplasm of colon, unspecified: Secondary | ICD-10-CM | POA: Diagnosis present

## 2014-08-23 DIAGNOSIS — C7949 Secondary malignant neoplasm of other parts of nervous system: Secondary | ICD-10-CM | POA: Diagnosis present

## 2014-08-23 DIAGNOSIS — N390 Urinary tract infection, site not specified: Secondary | ICD-10-CM | POA: Diagnosis present

## 2014-08-23 DIAGNOSIS — I5020 Unspecified systolic (congestive) heart failure: Secondary | ICD-10-CM | POA: Diagnosis present

## 2014-08-23 DIAGNOSIS — J449 Chronic obstructive pulmonary disease, unspecified: Secondary | ICD-10-CM | POA: Diagnosis present

## 2014-08-23 DIAGNOSIS — Z87891 Personal history of nicotine dependence: Secondary | ICD-10-CM

## 2014-08-23 DIAGNOSIS — E872 Acidosis: Secondary | ICD-10-CM | POA: Diagnosis present

## 2014-08-23 DIAGNOSIS — N179 Acute kidney failure, unspecified: Secondary | ICD-10-CM | POA: Diagnosis present

## 2014-08-23 DIAGNOSIS — K2100 Gastro-esophageal reflux disease with esophagitis, without bleeding: Secondary | ICD-10-CM

## 2014-08-23 DIAGNOSIS — A419 Sepsis, unspecified organism: Secondary | ICD-10-CM | POA: Diagnosis not present

## 2014-08-23 DIAGNOSIS — R531 Weakness: Secondary | ICD-10-CM | POA: Diagnosis not present

## 2014-08-23 DIAGNOSIS — Z9049 Acquired absence of other specified parts of digestive tract: Secondary | ICD-10-CM | POA: Diagnosis present

## 2014-08-23 DIAGNOSIS — E43 Unspecified severe protein-calorie malnutrition: Secondary | ICD-10-CM | POA: Diagnosis present

## 2014-08-23 DIAGNOSIS — R6520 Severe sepsis without septic shock: Secondary | ICD-10-CM | POA: Diagnosis present

## 2014-08-23 DIAGNOSIS — N400 Enlarged prostate without lower urinary tract symptoms: Secondary | ICD-10-CM | POA: Diagnosis present

## 2014-08-23 DIAGNOSIS — D638 Anemia in other chronic diseases classified elsewhere: Secondary | ICD-10-CM | POA: Diagnosis present

## 2014-08-23 HISTORY — DX: Unspecified systolic (congestive) heart failure: I50.20

## 2014-08-23 HISTORY — DX: Gastro-esophageal reflux disease without esophagitis: K21.9

## 2014-08-23 HISTORY — DX: Benign prostatic hyperplasia without lower urinary tract symptoms: N40.0

## 2014-08-23 HISTORY — DX: Malignant (primary) neoplasm, unspecified: C80.1

## 2014-08-23 HISTORY — DX: Chronic kidney disease, stage 4 (severe): N18.4

## 2014-08-23 MED ORDER — SODIUM CHLORIDE 0.9 % IV BOLUS (SEPSIS)
1000.0000 mL | Freq: Once | INTRAVENOUS | Status: AC
  Administered 2014-08-23: 1000 mL via INTRAVENOUS

## 2014-08-23 NOTE — ED Notes (Signed)
Upon assessment, pt is alert, oriented x 4. He states he is here for weakness. Pt states he uses his walker to get to the restroom and around his home. Pt appears to be very weak and malnutrition.

## 2014-08-23 NOTE — ED Notes (Signed)
Bed: WA02 Expected date:  Expected time:  Means of arrival:  Comments: EMS 74 yo male with weakness, cancer patient, tachypneic and tachycardic

## 2014-08-23 NOTE — ED Notes (Signed)
In radiology for scans.

## 2014-08-23 NOTE — ED Notes (Signed)
Per EMS, patient is from home but discharged [facility] on Saturday. Pt was visited by a home health nurse and physical therapy earlier today. Landlord found patient in a corner, sitting in his feces. When EMS arrived, pt was found on bed. Pt has a wound on buttocks but not been visually assessed. Pt has possible lung cancer with treatment from radiation. Pt is alert but experiencing generalized weakness and altered mental status from his normal based on the landlord.

## 2014-08-24 ENCOUNTER — Encounter (HOSPITAL_COMMUNITY): Payer: Self-pay | Admitting: Internal Medicine

## 2014-08-24 ENCOUNTER — Inpatient Hospital Stay (HOSPITAL_COMMUNITY): Payer: Medicare Other

## 2014-08-24 DIAGNOSIS — Z87891 Personal history of nicotine dependence: Secondary | ICD-10-CM | POA: Diagnosis not present

## 2014-08-24 DIAGNOSIS — R52 Pain, unspecified: Secondary | ICD-10-CM

## 2014-08-24 DIAGNOSIS — I509 Heart failure, unspecified: Secondary | ICD-10-CM | POA: Insufficient documentation

## 2014-08-24 DIAGNOSIS — B9620 Unspecified Escherichia coli [E. coli] as the cause of diseases classified elsewhere: Secondary | ICD-10-CM | POA: Diagnosis present

## 2014-08-24 DIAGNOSIS — J430 Unilateral pulmonary emphysema [MacLeod's syndrome]: Secondary | ICD-10-CM | POA: Diagnosis not present

## 2014-08-24 DIAGNOSIS — C7951 Secondary malignant neoplasm of bone: Secondary | ICD-10-CM | POA: Diagnosis present

## 2014-08-24 DIAGNOSIS — N184 Chronic kidney disease, stage 4 (severe): Secondary | ICD-10-CM | POA: Insufficient documentation

## 2014-08-24 DIAGNOSIS — Z515 Encounter for palliative care: Secondary | ICD-10-CM | POA: Diagnosis not present

## 2014-08-24 DIAGNOSIS — E876 Hypokalemia: Secondary | ICD-10-CM | POA: Diagnosis not present

## 2014-08-24 DIAGNOSIS — I2510 Atherosclerotic heart disease of native coronary artery without angina pectoris: Secondary | ICD-10-CM | POA: Diagnosis present

## 2014-08-24 DIAGNOSIS — E872 Acidosis: Secondary | ICD-10-CM | POA: Diagnosis present

## 2014-08-24 DIAGNOSIS — N179 Acute kidney failure, unspecified: Secondary | ICD-10-CM | POA: Diagnosis present

## 2014-08-24 DIAGNOSIS — A419 Sepsis, unspecified organism: Secondary | ICD-10-CM | POA: Diagnosis present

## 2014-08-24 DIAGNOSIS — I5022 Chronic systolic (congestive) heart failure: Secondary | ICD-10-CM | POA: Diagnosis present

## 2014-08-24 DIAGNOSIS — E875 Hyperkalemia: Secondary | ICD-10-CM

## 2014-08-24 DIAGNOSIS — Z9049 Acquired absence of other specified parts of digestive tract: Secondary | ICD-10-CM | POA: Diagnosis present

## 2014-08-24 DIAGNOSIS — R531 Weakness: Secondary | ICD-10-CM | POA: Diagnosis present

## 2014-08-24 DIAGNOSIS — Z923 Personal history of irradiation: Secondary | ICD-10-CM | POA: Diagnosis not present

## 2014-08-24 DIAGNOSIS — N400 Enlarged prostate without lower urinary tract symptoms: Secondary | ICD-10-CM | POA: Diagnosis present

## 2014-08-24 DIAGNOSIS — E43 Unspecified severe protein-calorie malnutrition: Secondary | ICD-10-CM | POA: Diagnosis present

## 2014-08-24 DIAGNOSIS — C189 Malignant neoplasm of colon, unspecified: Secondary | ICD-10-CM | POA: Diagnosis present

## 2014-08-24 DIAGNOSIS — K2100 Gastro-esophageal reflux disease with esophagitis, without bleeding: Secondary | ICD-10-CM | POA: Insufficient documentation

## 2014-08-24 DIAGNOSIS — J449 Chronic obstructive pulmonary disease, unspecified: Secondary | ICD-10-CM | POA: Diagnosis present

## 2014-08-24 DIAGNOSIS — K219 Gastro-esophageal reflux disease without esophagitis: Secondary | ICD-10-CM | POA: Diagnosis present

## 2014-08-24 DIAGNOSIS — Z681 Body mass index (BMI) 19 or less, adult: Secondary | ICD-10-CM | POA: Diagnosis not present

## 2014-08-24 DIAGNOSIS — R6520 Severe sepsis without septic shock: Secondary | ICD-10-CM | POA: Diagnosis not present

## 2014-08-24 DIAGNOSIS — C7931 Secondary malignant neoplasm of brain: Secondary | ICD-10-CM

## 2014-08-24 DIAGNOSIS — D638 Anemia in other chronic diseases classified elsewhere: Secondary | ICD-10-CM | POA: Diagnosis present

## 2014-08-24 DIAGNOSIS — N10 Acute tubulo-interstitial nephritis: Secondary | ICD-10-CM | POA: Diagnosis present

## 2014-08-24 DIAGNOSIS — I5020 Unspecified systolic (congestive) heart failure: Secondary | ICD-10-CM | POA: Diagnosis present

## 2014-08-24 DIAGNOSIS — N390 Urinary tract infection, site not specified: Secondary | ICD-10-CM

## 2014-08-24 DIAGNOSIS — E162 Hypoglycemia, unspecified: Secondary | ICD-10-CM | POA: Diagnosis present

## 2014-08-24 LAB — COMPREHENSIVE METABOLIC PANEL
ALBUMIN: 2.9 g/dL — AB (ref 3.5–5.0)
ALT: 15 U/L — ABNORMAL LOW (ref 17–63)
AST: 20 U/L (ref 15–41)
Alkaline Phosphatase: 77 U/L (ref 38–126)
Anion gap: 20 — ABNORMAL HIGH (ref 5–15)
BUN: 131 mg/dL — ABNORMAL HIGH (ref 6–20)
CO2: 13 mmol/L — ABNORMAL LOW (ref 22–32)
CREATININE: 5.06 mg/dL — AB (ref 0.61–1.24)
Calcium: 11.7 mg/dL — ABNORMAL HIGH (ref 8.9–10.3)
Chloride: 102 mmol/L (ref 101–111)
GFR calc Af Amer: 12 mL/min — ABNORMAL LOW (ref >60)
GFR, EST NON AFRICAN AMERICAN: 10 mL/min — AB (ref >60)
GLUCOSE: 54 mg/dL — AB (ref 65–99)
POTASSIUM: 6.1 mmol/L — AB (ref 3.5–5.1)
Sodium: 135 mmol/L (ref 135–145)
Total Bilirubin: 1 mg/dL (ref 0.3–1.2)
Total Protein: 8 g/dL (ref 6.5–8.1)

## 2014-08-24 LAB — CBC WITH DIFFERENTIAL/PLATELET
Basophils Absolute: 0 10*3/uL (ref 0.0–0.1)
Basophils Relative: 0 % (ref 0–1)
EOS ABS: 0 10*3/uL (ref 0.0–0.7)
Eosinophils Relative: 0 % (ref 0–5)
HCT: 34.5 % — ABNORMAL LOW (ref 39.0–52.0)
Hemoglobin: 10.4 g/dL — ABNORMAL LOW (ref 13.0–17.0)
LYMPHS ABS: 1.6 10*3/uL (ref 0.7–4.0)
Lymphocytes Relative: 8 % — ABNORMAL LOW (ref 12–46)
MCH: 21.2 pg — ABNORMAL LOW (ref 26.0–34.0)
MCHC: 30.1 g/dL (ref 30.0–36.0)
MCV: 70.3 fL — ABNORMAL LOW (ref 78.0–100.0)
MONOS PCT: 3 % (ref 3–12)
Monocytes Absolute: 0.6 10*3/uL (ref 0.1–1.0)
Neutro Abs: 17.3 10*3/uL — ABNORMAL HIGH (ref 1.7–7.7)
Neutrophils Relative %: 89 % — ABNORMAL HIGH (ref 43–77)
PLATELETS: 348 10*3/uL (ref 150–400)
RBC: 4.91 MIL/uL (ref 4.22–5.81)
RDW: 17 % — ABNORMAL HIGH (ref 11.5–15.5)
WBC: 19.5 10*3/uL — ABNORMAL HIGH (ref 4.0–10.5)

## 2014-08-24 LAB — LACTIC ACID, PLASMA
Lactic Acid, Venous: 1.3 mmol/L (ref 0.5–2.0)
Lactic Acid, Venous: 2 mmol/L (ref 0.5–2.0)

## 2014-08-24 LAB — APTT: APTT: 28 s (ref 24–37)

## 2014-08-24 LAB — URINE MICROSCOPIC-ADD ON

## 2014-08-24 LAB — URINALYSIS, ROUTINE W REFLEX MICROSCOPIC
Glucose, UA: NEGATIVE mg/dL
Ketones, ur: NEGATIVE mg/dL
NITRITE: NEGATIVE
PH: 8 (ref 5.0–8.0)
Specific Gravity, Urine: 1.016 (ref 1.005–1.030)
Urobilinogen, UA: 0.2 mg/dL (ref 0.0–1.0)

## 2014-08-24 LAB — BASIC METABOLIC PANEL
Anion gap: 18 — ABNORMAL HIGH (ref 5–15)
BUN: 134 mg/dL — ABNORMAL HIGH (ref 6–20)
CO2: 14 mmol/L — AB (ref 22–32)
Calcium: 11 mg/dL — ABNORMAL HIGH (ref 8.9–10.3)
Chloride: 106 mmol/L (ref 101–111)
Creatinine, Ser: 4.75 mg/dL — ABNORMAL HIGH (ref 0.61–1.24)
GFR calc Af Amer: 13 mL/min — ABNORMAL LOW (ref >60)
GFR, EST NON AFRICAN AMERICAN: 11 mL/min — AB (ref >60)
Glucose, Bld: 52 mg/dL — ABNORMAL LOW (ref 65–99)
Potassium: 5.5 mmol/L — ABNORMAL HIGH (ref 3.5–5.1)
Sodium: 138 mmol/L (ref 135–145)

## 2014-08-24 LAB — MAGNESIUM: Magnesium: 2.8 mg/dL — ABNORMAL HIGH (ref 1.7–2.4)

## 2014-08-24 LAB — PROCALCITONIN: Procalcitonin: 2.23 ng/mL

## 2014-08-24 LAB — CBC
HCT: 31.4 % — ABNORMAL LOW (ref 39.0–52.0)
Hemoglobin: 9.5 g/dL — ABNORMAL LOW (ref 13.0–17.0)
MCH: 21 pg — ABNORMAL LOW (ref 26.0–34.0)
MCHC: 30.3 g/dL (ref 30.0–36.0)
MCV: 69.5 fL — ABNORMAL LOW (ref 78.0–100.0)
Platelets: 308 10*3/uL (ref 150–400)
RBC: 4.52 MIL/uL (ref 4.22–5.81)
RDW: 16.9 % — ABNORMAL HIGH (ref 11.5–15.5)
WBC: 17.5 10*3/uL — AB (ref 4.0–10.5)

## 2014-08-24 LAB — GLUCOSE, CAPILLARY
GLUCOSE-CAPILLARY: 81 mg/dL (ref 65–99)
GLUCOSE-CAPILLARY: 98 mg/dL (ref 65–99)
Glucose-Capillary: 55 mg/dL — ABNORMAL LOW (ref 65–99)
Glucose-Capillary: 83 mg/dL (ref 65–99)

## 2014-08-24 LAB — MRSA PCR SCREENING: MRSA by PCR: NEGATIVE

## 2014-08-24 LAB — PHOSPHORUS: PHOSPHORUS: 7.8 mg/dL — AB (ref 2.5–4.6)

## 2014-08-24 LAB — PROTIME-INR
INR: 1.18 (ref 0.00–1.49)
Prothrombin Time: 15.2 seconds (ref 11.6–15.2)

## 2014-08-24 MED ORDER — NEPRO/CARBSTEADY PO LIQD
237.0000 mL | Freq: Three times a day (TID) | ORAL | Status: DC
  Administered 2014-08-24 (×3): 237 mL via ORAL
  Filled 2014-08-24 (×5): qty 237

## 2014-08-24 MED ORDER — SODIUM BICARBONATE 8.4 % IV SOLN
INTRAVENOUS | Status: DC
  Administered 2014-08-24 – 2014-08-27 (×6): via INTRAVENOUS
  Filled 2014-08-24 (×16): qty 150

## 2014-08-24 MED ORDER — SODIUM CHLORIDE 0.9 % IV SOLN
INTRAVENOUS | Status: DC
Start: 2014-08-24 — End: 2014-08-24

## 2014-08-24 MED ORDER — HYDROCORTISONE NA SUCCINATE PF 100 MG IJ SOLR
50.0000 mg | Freq: Once | INTRAMUSCULAR | Status: AC
  Administered 2014-08-24: 50 mg via INTRAVENOUS
  Filled 2014-08-24: qty 1

## 2014-08-24 MED ORDER — DEXTROSE 5 % IV SOLN
1.0000 g | INTRAVENOUS | Status: DC
  Administered 2014-08-24 – 2014-08-26 (×3): 1 g via INTRAVENOUS
  Filled 2014-08-24 (×4): qty 10

## 2014-08-24 MED ORDER — ONDANSETRON HCL 4 MG PO TABS: 4.0000 mg | ORAL_TABLET | Freq: Four times a day (QID) | ORAL | Status: DC | PRN

## 2014-08-24 MED ORDER — PANTOPRAZOLE SODIUM 40 MG PO TBEC
40.0000 mg | DELAYED_RELEASE_TABLET | Freq: Every day | ORAL | Status: DC
  Administered 2014-08-24 – 2014-08-28 (×5): 40 mg via ORAL
  Filled 2014-08-24 (×6): qty 1

## 2014-08-24 MED ORDER — DEXTROSE 5 % IV SOLN
1.0000 g | Freq: Once | INTRAVENOUS | Status: AC
  Administered 2014-08-24: 1 g via INTRAVENOUS
  Filled 2014-08-24: qty 10

## 2014-08-24 MED ORDER — DEXTROSE 50 % IV SOLN
50.0000 mL | Freq: Once | INTRAVENOUS | Status: AC
  Administered 2014-08-24: 50 mL via INTRAVENOUS

## 2014-08-24 MED ORDER — SODIUM CHLORIDE 0.9 % IV SOLN: INTRAVENOUS | Status: DC

## 2014-08-24 MED ORDER — SODIUM CHLORIDE 0.9 % IJ SOLN
3.0000 mL | Freq: Two times a day (BID) | INTRAMUSCULAR | Status: DC
  Administered 2014-08-24 – 2014-08-26 (×7): 3 mL via INTRAVENOUS

## 2014-08-24 MED ORDER — DEXTROSE 50 % IV SOLN
50.0000 mL | INTRAVENOUS | Status: DC | PRN
  Filled 2014-08-24: qty 50

## 2014-08-24 MED ORDER — SODIUM CHLORIDE 0.9 % IV BOLUS (SEPSIS)
500.0000 mL | Freq: Once | INTRAVENOUS | Status: AC
  Administered 2014-08-24: 500 mL via INTRAVENOUS

## 2014-08-24 MED ORDER — HYDROMORPHONE HCL 1 MG/ML PO LIQD
1.0000 mg | ORAL | Status: DC | PRN
Start: 2014-08-24 — End: 2014-08-24
  Filled 2014-08-24: qty 1

## 2014-08-24 MED ORDER — ACETAMINOPHEN 325 MG PO TABS
650.0000 mg | ORAL_TABLET | Freq: Four times a day (QID) | ORAL | Status: DC | PRN
  Administered 2014-08-24: 650 mg via ORAL
  Filled 2014-08-24: qty 2

## 2014-08-24 MED ORDER — SODIUM POLYSTYRENE SULFONATE 15 GM/60ML PO SUSP: 15.0000 g | Freq: Once | ORAL | Status: DC

## 2014-08-24 MED ORDER — LORAZEPAM 0.5 MG PO TABS: 0.5000 mg | ORAL_TABLET | Freq: Four times a day (QID) | ORAL | Status: DC | PRN

## 2014-08-24 MED ORDER — SODIUM BICARBONATE 650 MG PO TABS
650.0000 mg | ORAL_TABLET | Freq: Two times a day (BID) | ORAL | Status: DC
  Filled 2014-08-24 (×2): qty 1

## 2014-08-24 MED ORDER — TROLAMINE SALICYLATE 10 % EX CREA
1.0000 "application " | TOPICAL_CREAM | CUTANEOUS | Status: DC | PRN
  Filled 2014-08-24: qty 85

## 2014-08-24 MED ORDER — HYDROMORPHONE HCL 2 MG PO TABS
1.0000 mg | ORAL_TABLET | ORAL | Status: DC | PRN
  Administered 2014-08-24 – 2014-08-28 (×10): 1 mg via ORAL
  Filled 2014-08-24 (×11): qty 1

## 2014-08-24 MED ORDER — MORPHINE SULFATE (CONCENTRATE) 10 MG/0.5ML PO SOLN: 10.0000 mg | ORAL | Status: DC | PRN

## 2014-08-24 MED ORDER — ALUM & MAG HYDROXIDE-SIMETH 200-200-20 MG/5ML PO SUSP
30.0000 mL | Freq: Four times a day (QID) | ORAL | Status: DC | PRN
  Filled 2014-08-24: qty 30

## 2014-08-24 MED ORDER — IPRATROPIUM-ALBUTEROL 0.5-2.5 (3) MG/3ML IN SOLN: 3.0000 mL | Freq: Four times a day (QID) | RESPIRATORY_TRACT | Status: DC | PRN

## 2014-08-24 MED ORDER — SODIUM POLYSTYRENE SULFONATE 15 GM/60ML PO SUSP
15.0000 g | Freq: Once | ORAL | Status: AC
  Administered 2014-08-24: 15 g via ORAL
  Filled 2014-08-24: qty 60

## 2014-08-24 MED ORDER — ONDANSETRON HCL 4 MG/2ML IJ SOLN: 4.0000 mg | Freq: Four times a day (QID) | INTRAMUSCULAR | Status: DC | PRN

## 2014-08-24 MED ORDER — MUSCLE RUB 10-15 % EX CREA
TOPICAL_CREAM | CUTANEOUS | Status: DC | PRN
  Filled 2014-08-24 (×2): qty 85

## 2014-08-24 MED ORDER — ACETAMINOPHEN 650 MG RE SUPP
650.0000 mg | Freq: Four times a day (QID) | RECTAL | Status: DC | PRN
Start: 2014-08-24 — End: 2014-08-28

## 2014-08-24 MED ORDER — DEXTROSE-NACL 5-0.9 % IV SOLN
INTRAVENOUS | Status: DC
  Administered 2014-08-24: 06:00:00 via INTRAVENOUS

## 2014-08-24 MED ORDER — MORPHINE SULFATE 2 MG/ML IJ SOLN: 1.0000 mg | INTRAMUSCULAR | Status: DC | PRN

## 2014-08-24 MED ORDER — HEPARIN SODIUM (PORCINE) 5000 UNIT/ML IJ SOLN
5000.0000 [IU] | Freq: Three times a day (TID) | INTRAMUSCULAR | Status: DC
  Administered 2014-08-24 – 2014-08-28 (×12): 5000 [IU] via SUBCUTANEOUS
  Filled 2014-08-24 (×18): qty 1

## 2014-08-24 MED ORDER — DEXAMETHASONE 4 MG PO TABS
4.0000 mg | ORAL_TABLET | Freq: Two times a day (BID) | ORAL | Status: DC
  Administered 2014-08-25 – 2014-08-28 (×7): 4 mg via ORAL
  Filled 2014-08-24 (×8): qty 1

## 2014-08-24 NOTE — H&P (Signed)
[facility] History and Physical  [name] IHK:[id number] DOB: [birth year] DOA: 08/23/2014  Referring physician: ED physician PCP: No primary care provider on file.  Specialists:   Chief Complaint: Generalized weakness  HPI: [name] is a 74 y.o. 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 He was seen by radiation oncology and received radiation treatment, thereafter patient is adamant that he will not pursue any further radiation treatments as they're making him go crazy and sick. He states that in previous admission, he was so scared that he decided to be on DNR. However today he strongly wants to be full code. He was supposed to follow with oncologist Dr. Betsy Coder, but has not done so yet. He was discharged back to skilled nursing facility initially, and was just discharged from SNF back home on Saturday. Patient has home health coming, and found that patient is very weak toady. Patient reports increasing pain in his proximal right femur where he is known to have lytic lesions. Patient also had hypercalcemia and was discharged on Decadron.  Patient does not have chest pain, shortness breath, abdominal pain, diarrhea, symptoms for UTI. He reports that he has darker colored urine. Has mild dry cough. No fever, chills, vision changes, hearing loss, unilateral weakness or numbness or tenderness incisions the extremities.  In ED, patient was found to have positive urinalysis, lactate of 2.0, tachycardia, calcium 11.7, AoCKD-IV-V, potassium 6.1 without T-wave change. Chest x-ray showed COPD,  but no acute abnormalities. CT-renal stone protocol is pending. Patient is admitted to inpatient for further evaluation and treatment. Renal was consulted.  Where does patient live?   At home   Can patient participate in ADLs?  Barely     Review of Systems:   General: no fevers, chills, has poor appetite, has fatigue HEENT: no blurry vision, hearing changes or sore throat Pulm: no dyspnea, has dry coughing, no wheezing CV: no chest pain, palpitations Abd: no nausea, vomiting, abdominal pain, diarrhea, constipation GU: no dysuria, burning on urination, increased urinary frequency, hematuria  Ext: no leg edema Neuro: no unilateral weakness, numbness, or tingling, no vision change or hearing loss Skin: no rash MSK: No muscle spasm, no deformity, no limitation of range of movement in spin Heme: No easy bruising.  Travel history: No recent long distant travel.  Allergy: No Known Allergies  Past Medical History  Diagnosis Date  . COPD (chronic obstructive pulmonary disease)   . Cancer   . GERD (gastroesophageal reflux disease)   . BPH (benign prostatic hyperplasia)   . Systolic congestive heart failure   . CKD (chronic kidney disease), stage IV     Past Surgical History  Procedure Laterality Date  . Abdominal surgery    . Radiology with anesthesia N/A 07/18/2014    Procedure: MRI - T-Spine without Contrast;  Surgeon: Medication Radiologist, MD;  Location: [place];  Service: Radiology;  Laterality: N/A;  . Colonoscopy      Social History:  reports that he has quit smoking. He does not have any smokeless tobacco history on file. He reports that he does not drink alcohol or use illicit drugs.  Family History:  History reviewed. Patient states that his father dead from assault, other than that other family members have no significant medical issues.  Prior to Admission medications   Medication Sig Start Date End Date Taking? Authorizing Provider  dexamethasone (DECADRON) 4 MG tablet  Take 1 tablet (4 mg total) by mouth every 12 (twelve) hours. 07/20/14  Yes Thurnell Lose, MD  LORazepam (ATIVAN) 0.5 MG tablet Take 0.5 mg by mouth every 6 (six) hours as needed for anxiety. for anxiety 08/09/14  Yes Historical Provider, MD  Morphine Sulfate (MORPHINE CONCENTRATE) 10 MG/0.5ML SOLN concentrated solution Take 0.5 mLs (10 mg total) by mouth every 3 (three) hours as needed for moderate pain or severe pain. 07/20/14  Yes Thurnell Lose, MD  omeprazole (PRILOSEC) 20 MG capsule Take 20 mg by mouth daily.   Yes Historical Provider, MD  sodium bicarbonate 650 MG tablet Take 1 tablet by mouth 2 (two) times daily. 05/24/14  Yes Historical Provider, MD  trolamine salicylate (ASPERCREME) 10 % cream Apply 1 application topically as needed for muscle pain.   Yes Historical Provider, MD  ferrous sulfate 325 (65 FE) MG tablet Take 1 tablet (325 mg total) by mouth daily with breakfast. Patient not taking: Reported on 08/23/2014 10/10/13   Kinnie Feil, MD  LORazepam (ATIVAN) 2 MG/ML concentrated solution Take 0.5 mLs (1 mg total) by mouth every 6 (six) hours as needed for anxiety. Patient not taking: Reported on 08/23/2014 07/20/14   Thurnell Lose, MD  Nutritional Supplements (FEEDING SUPPLEMENT, NEPRO CARB STEADY,) LIQD Take 237 mLs by mouth 3 (three) times daily between meals. Patient not taking: Reported on 08/23/2014 07/20/14   Thurnell Lose, MD    Physical Exam: Filed Vitals:   08/24/14 9024 08/24/14 0455 08/24/14 0521 08/24/14 0540  BP: 109/66 102/57 102/57 123/72  Pulse: 102 97 97 105  Temp:   97.3 F (36.3 C)   TempSrc:      Resp: '18 21 21 21  '$ Height:      Weight:      SpO2: 97% 100% 100% 100%   General: Not in acute distress, cachectic HEENT:       Eyes: PERRL, EOMI, no scleral icterus.       ENT: No discharge from the ears and nose, no pharynx injection, no tonsillar enlargement.        Neck: No JVD, no bruit, no mass felt. Heme: No neck lymph node enlargement. Cardiac: S1/S2,  RRR, tachycardia, No murmurs, No gallops or rubs. Pulm: No rales, wheezing, rhonchi or rubs. Abd: Soft, nondistended, nontender, no rebound pain, no organomegaly, BS present. Has suprapubic Foley catheter with clean surroundings. Ext: No pitting leg edema bilaterally. 2+DP/PT pulse bilaterally. Musculoskeletal: No joint deformities, No joint redness or warmth, no limitation of ROM in spin. Has tenderness over right hip joint Skin: No rashes.  Neuro: Alert, oriented X3, cranial nerves II-XII grossly intact, muscle strength 5/5 in all extremities, sensation to light touch intact. Brachial reflex 1+ bilaterally. Knee reflex 1+ bilaterally. Negative Babinski's sign.  Psych: Patient is not psychotic, no suicidal or hemocidal ideation.  Labs on Admission:  Basic Metabolic Panel:  Recent Labs Lab 08/23/14 2345 08/24/14 0350  NA 135 138  K 6.1* 5.5*  CL 102 106  CO2 13* 14*  GLUCOSE 54* 52*  BUN 131* 134*  CREATININE 5.06* 4.75*  CALCIUM 11.7* 11.0*  MG  --  2.8*  PHOS  --  7.8*   Liver Function Tests:  Recent Labs Lab  08/23/14 2345  AST 20  ALT 15*  ALKPHOS 77  BILITOT 1.0  PROT 8.0  ALBUMIN 2.9*   No results for input(s): LIPASE, AMYLASE in the last 168 hours. No results for input(s): AMMONIA in the last 168 hours. CBC:  Recent Labs Lab 08/23/14 2345 08/24/14 0350  WBC 19.5* 17.5*  NEUTROABS 17.3*  --   HGB 10.4* 9.5*  HCT 34.5* 31.4*  MCV 70.3* 69.5*  PLT 348 308   Cardiac Enzymes: No results for input(s): CKTOTAL, CKMB, CKMBINDEX, TROPONINI in the last 168 hours.  BNP (last 3 results) No results for input(s): BNP in the last 8760 hours.  ProBNP (last 3 results)  Recent Labs  10/06/13 2143  PROBNP 1216.0*    CBG: No results for input(s): GLUCAP in the last 168 hours.  Radiological Exams on Admission: Dg Chest 2 View  08/24/2014   CLINICAL DATA:  Altered mental status, gluteal ulcer. History of metastatic cancer, COPD.  EXAM: CHEST  2 VIEW   COMPARISON:  CT chest July 14, 2014  FINDINGS: Cardiomediastinal silhouette is nonsuspicious. Increased lung volumes consistent with history of COPD without pleural effusion. Presumed nipple shadow RIGHT lung base. Calcified granuloma. No pneumothorax. Suspected lytic lesion RIGHT humerus versus artifact.  IMPRESSION: COPD without acute cardiopulmonary process.   Electronically Signed   By: Elon Alas M.D.   On: 08/24/2014 00:20   Ct Renal Stone Study  08/24/2014   CLINICAL DATA:  Abdominal pain and leukocytosis  EXAM: CT ABDOMEN AND PELVIS WITHOUT CONTRAST  TECHNIQUE: Multidetector CT imaging of the abdomen and pelvis was performed following the standard protocol without IV contrast.  COMPARISON:  07/14/2014  FINDINGS: There is no evidence of bowel obstruction. No acute inflammatory changes are evident. There is no abscess, ascites or drainable fluid collection.  There is moderate atrophy of both kidneys. There is chronic hydronephrosis and hydroureter bilaterally. There is mural thickening of the ureters and renal collecting systems consistent with chronic pyelonephritis.  There is marked irregularity of the urinary bladder with mass like nodularity and mural thickening. There is a suprapubic urinary bladder catheter.  There are grossly unremarkable unenhanced appearances of the liver and bile ducts. There is a 8 mm calculus within the gallbladder lumen. There are unremarkable unenhanced appearances of the spleen, pancreas and adrenals.  There is progression of skeletal metastatic disease. The right iliac lesion measures 6.5 x 10.6 by 11.0 cm and previously measured 4.8 x 7.1 by 7.4 cm. The right L5 transverse process mass measures 2.2 x 3.2 x 3.4 cm and previously measured 1.5 x 2.4 x 2.6 cm. There is a new skeletal metastasis involving the inferior left pubic ramus. The right pubic rami CT metastasis is only slightly larger, measuring 3.8 x 4.4 cm and previously measuring 3.4 x 4.3 cm.  There is no  significant abnormality in the lower chest.  IMPRESSION: Bilateral renal atrophy with chronic hydronephrosis and hydroureter. Moderate mural thickening of the ureters and collecting systems, consistent with chronic pyelonephritis. The urinary bladder is markedly irregular, perhaps involved with multiple masses. This is not significantly changed from 07/14/2014. Unremarkable suprapubic urinary bladder catheter.  No acute inflammatory changes are evident in the abdomen or pelvis.  Negative for abscess, extraluminal air, or bowel obstruction.  Cholelithiasis  Progression of skeletal metastatic disease.   Electronically Signed   By: Andreas Newport M.D.   On: 08/24/2014 05:14   Dg Femur, Min 2 Views Right  08/24/2014   CLINICAL DATA:  Right hip and leg  pain. Vague history of recent fall.  EXAM: RIGHT FEMUR 2 VIEWS  COMPARISON:  None.  FINDINGS: There is no evidence of fracture or other focal bone lesions. Soft tissues are unremarkable.  IMPRESSION: Negative.   Electronically Signed   By: Andreas Newport M.D.   On: 08/24/2014 00:21    EKG: Independently reviewed.  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 His blood pressure was low initially, which responded to IV fluid. -will admit to SDU -will treat with IV Ceftriaxone, 1 g, every 24 hours. -follow up blood and urine culture and narrow abx according to sensitivities -will get Procalcitonin and trend lactic acid level per sepsis protocol -IVF: 2L  of NS bolus, followed by D5-NS 100 cc/h. Will bolus as needed.  Hyperkalemia: Potassium 6.1 without EKG changes. This is secondary to worsening renal function. -Treated with Kayexalate 15 g 2 -Repeat BMP showed potassium 5.5, recheck EKG, and gave another dose of Kayexalate 15 gram 1.  Metastatic squamous cell cancer: Unknown primary with metastatic lesion to T-spine, right iliac bone and possibly lung. In previous addition, patient is adamant that he would not pursue any further radiation treatments as they're making him go crazy and sick. He was supposed to follow with oncologist Dr. Betsy Coder, but has not done so yet. In this admission, they wants to be on full code.  -May need to consult oncology again.  -Symptomatic treatment for pain and nausea  Hypoglycemia: Likely due to decreased oral intake. 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 Currently on Decadron, received pamidronate on 07/18/2014. Calcium is 11.7 -will treat with IVF first, if no improvement, may need paimidronate again. -Continue Decadron -one dose of Solu Cortef, 50 mg 1  Chronic suprapubic Foley catheter: present with UTI which is likely caused by the cath versus chronic colonization.  -treat UTI as above.  AoCKD- IV and hyperkalemia. Baseline cre is close to 2.5. His creatinine is 5.06, BUN 31, carbonate 13, potassium 6.1. This is most likely due to dehydration and UTI -IVF as above -consulted renal by Ed -Kayexalate 15 g 2 were given in ED -Continue bicarbonate  Anemia of chronic disease: Hgb=10.4 -No acute issue. -folllow up by CBC  Systolic congestive heart failure: 2d ehco on 02/22/15 showed EF 40-45%. Patient is clinically dry on this admission. -We not check a BNP given severe AOCKD-IV-V -Continue IV fluids as above -Watch volume status carefully.  Protein calorie malnutrition: Severe. Patient is a cachectic -Nepro feeding supplement  COPD: No signs of  acute exacerbation. -DuoNeb when necessary  GERD: -Protonix   DVT ppx: SQ Heparin      Code Status: Patient wants to be on full code. Patient states that in previous admission, he was scared and decided to be on DNR, but today he strongly wants be on full code.   Family Communication: None at bed side.   Disposition Plan: Admit to inpatient   Date of Service 08/24/2014    Ivor Costa [facility] Pager [phone]  If 7PM-7AM,  please contact night-coverage [email or web address] Password [email or web address] 08/24/2014, 6:03 AM

## 2014-08-24 NOTE — Care Management Note (Signed)
Case Management Note  Patient Details  Name: [name] MRN: [id number] Date of Birth: [birth year]  Subjective/Objective:  Patient lives in boarding house - has a locked room in a house with 15 other renters.  States has no family.  Recently discharged from SNF to home, but did not do well at home.  Plan for discharge to SNF.  SW aware.                   Action/Plan:   Expected Discharge Date:                  Expected Discharge Plan:  Long Term Acute Care (LTAC)  In-House Referral:     Discharge planning Services     Post Acute Care Choice:    Choice offered to:     DME Arranged:    DME Agency:     HH Arranged:    HH Agency:     Status of Service:  In process, will continue to follow  Medicare Important Message Given:    Date Medicare IM Given:    Medicare IM give by:    Date Additional Medicare IM Given:    Additional Medicare Important Message give by:     If discussed at [place] of Stay Meetings, dates discussed:    Additional Comments:  Vergie Living, RN 08/24/2014, 3:00 PM

## 2014-08-24 NOTE — Consult Note (Signed)
Reason for Consult: ARF on CKD stage 4 Referring Physician: Evelina Bucy MD (EDP)   HPI:  74 year old African-American man who I follow at [facility] for his chronic kidney disease stage IV that appears to be primarily obstructive uropathy from previous history of obstruction secondary to urethral strictures status post suprapubic catheter. 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 He has decided not to continue radiation therapy as the treatments are making him sicker. He presented to the [facility] emergency room yesterday with decreasing appetite, feeling weaker and a mild dry cough with occasional nausea. Denied any fever, chills, chest pain or shortness of breath. Discussions with the admitting doctor noted. He reports a difficult social situations that have led him to have poor dietary choices/access to healthcare  I had a lengthy discussion with the patient regarding his worsening renal failure and he has elected NOT TO UNDERTAKE HEMODIALYSIS with the understanding that this would likely result in his death.  Past Medical History  Diagnosis Date  . COPD (chronic obstructive pulmonary disease)   . Cancer   . GERD (gastroesophageal reflux disease)   . BPH (benign prostatic hyperplasia)   . Systolic congestive heart failure   . CKD (chronic kidney disease), stage IV     Past Surgical History  Procedure Laterality Date  . Abdominal surgery    . Radiology with anesthesia N/A 07/18/2014    Procedure: MRI - T-Spine without Contrast;  Surgeon: Medication Radiologist, MD;  Location: [place];  Service: Radiology;  Laterality: N/A;  . Colonoscopy      Family History  Problem Relation Age of Onset  .       Social History:  reports that he has quit smoking. He does not have any smokeless tobacco history on  file. He reports that he does not drink alcohol or use illicit drugs.  Allergies: No Known Allergies  Medications:  Scheduled: . cefTRIAXone (ROCEPHIN)  IV  1 g Intravenous Q24H  . feeding supplement (NEPRO CARB STEADY)  237 mL Oral TID BM  . heparin  5,000 Units Subcutaneous 3 times per day  . pantoprazole  40 mg Oral Daily  . sodium bicarbonate  650 mg Oral BID  . sodium chloride  3 mL Intravenous Q12H  . sodium polystyrene  15 g Oral Once    Results for orders placed or performed during the hospital encounter of 08/23/14 (from the past 48 hour(s))  CBC with Differential/Platelet     Status: Abnormal   Collection Time: 08/23/14 11:45 PM  Result Value Ref Range   WBC 19.5 (H) 4.0 - 10.5 K/uL   RBC 4.91 4.22 - 5.81 MIL/uL   Hemoglobin 10.4 (L) 13.0 - 17.0 g/dL   HCT 34.5 (L) 39.0 - 52.0 %   MCV 70.3 (L) 78.0 - 100.0 fL   MCH 21.2 (L) 26.0 - 34.0 pg   MCHC 30.1 30.0 - 36.0 g/dL   RDW 17.0 (H) 11.5 - 15.5 %   Platelets 348 150 - 400 K/uL   Neutrophils Relative % 89 (H) 43 - 77 %   Lymphocytes Relative 8 (L) 12 - 46 %   Monocytes Relative 3 3 - 12 %   Eosinophils Relative 0 0 - 5 %   Basophils Relative 0 0 - 1 %   Neutro Abs 17.3 (H) 1.7 - 7.7  K/uL   Lymphs Abs 1.6 0.7 - 4.0 K/uL   Monocytes Absolute 0.6 0.1 - 1.0 K/uL   Eosinophils Absolute 0.0 0.0 - 0.7 K/uL   Basophils Absolute 0.0 0.0 - 0.1 K/uL   Smear Review MORPHOLOGY UNREMARKABLE   Comprehensive metabolic panel     Status: Abnormal   Collection Time: 08/23/14 11:45 PM  Result Value Ref Range   Sodium 135 135 - 145 mmol/L   Potassium 6.1 (HH) 3.5 - 5.1 mmol/L    Comment: REPEATED TO VERIFY NO VISIBLE HEMOLYSIS CRITICAL RESULT CALLED TO, READ BACK BY AND VERIFIED WITHCandiss Norse RN [id number] 08/24/14 A NAVARRO    Chloride 102 101 - 111 mmol/L   CO2 13 (L) 22 - 32 mmol/L   Glucose, Bld 54 (L) 65 - 99 mg/dL   BUN 131 (H) 6 - 20 mg/dL    Comment: RESULTS CONFIRMED BY MANUAL DILUTION   Creatinine, Ser 5.06 (H) 0.61 -  1.24 mg/dL   Calcium 11.7 (H) 8.9 - 10.3 mg/dL   Total Protein 8.0 6.5 - 8.1 g/dL   Albumin 2.9 (L) 3.5 - 5.0 g/dL   AST 20 15 - 41 U/L   ALT 15 (L) 17 - 63 U/L   Alkaline Phosphatase 77 38 - 126 U/L   Total Bilirubin 1.0 0.3 - 1.2 mg/dL   GFR calc non Af Amer 10 (L) >60 mL/min   GFR calc Af Amer 12 (L) >60 mL/min    Comment: (NOTE) The eGFR has been calculated using the CKD EPI equation. This calculation has not been validated in all clinical situations. eGFR's persistently <60 mL/min signify possible Chronic Kidney Disease.    Anion gap 20 (H) 5 - 15  Lactic acid, plasma     Status: None   Collection Time: 08/23/14 11:45 PM  Result Value Ref Range   Lactic Acid, Venous 2.0 0.5 - 2.0 mmol/L  Urinalysis, Routine w reflex microscopic (not at [facility])     Status: Abnormal   Collection Time: 08/23/14 11:52 PM  Result Value Ref Range   Color, Urine BROWN (A) YELLOW    Comment: BIOCHEMICALS MAY BE AFFECTED BY COLOR   APPearance TURBID (A) CLEAR   Specific Gravity, Urine 1.016 1.005 - 1.030   pH 8.0 5.0 - 8.0   Glucose, UA NEGATIVE NEGATIVE mg/dL   Hgb urine dipstick LARGE (A) NEGATIVE   Bilirubin Urine SMALL (A) NEGATIVE   Ketones, ur NEGATIVE NEGATIVE mg/dL   Protein, ur >300 (A) NEGATIVE mg/dL   Urobilinogen, UA 0.2 0.0 - 1.0 mg/dL   Nitrite NEGATIVE NEGATIVE   Leukocytes, UA LARGE (A) NEGATIVE  Urine microscopic-add on     Status: Abnormal   Collection Time: 08/23/14 11:52 PM  Result Value Ref Range   Squamous Epithelial / LPF FEW (A) RARE   WBC, UA TOO NUMEROUS TO COUNT <3 WBC/hpf   RBC / HPF TOO NUMEROUS TO COUNT <3 RBC/hpf   Bacteria, UA MANY (A) RARE   Urine-Other URINALYSIS PERFORMED ON SUPERNATANT     Comment: MICROSCOPIC EXAM PERFORMED ON UNCONCENTRATED URINE  Phosphorus     Status: Abnormal   Collection Time: 08/24/14  3:50 AM  Result Value Ref Range   Phosphorus 7.8 (H) 2.5 - 4.6 mg/dL  Magnesium     Status: Abnormal   Collection Time: 08/24/14  3:50 AM  Result  Value Ref Range   Magnesium 2.8 (H) 1.7 - 2.4 mg/dL  CBC     Status: Abnormal   Collection Time:  08/24/14  3:50 AM  Result Value Ref Range   WBC 17.5 (H) 4.0 - 10.5 K/uL   RBC 4.52 4.22 - 5.81 MIL/uL   Hemoglobin 9.5 (L) 13.0 - 17.0 g/dL   HCT 31.4 (L) 39.0 - 52.0 %   MCV 69.5 (L) 78.0 - 100.0 fL   MCH 21.0 (L) 26.0 - 34.0 pg   MCHC 30.3 30.0 - 36.0 g/dL   RDW 16.9 (H) 11.5 - 15.5 %   Platelets 308 150 - 400 K/uL    Comment: REPEATED TO VERIFY  Basic metabolic panel     Status: Abnormal   Collection Time: 08/24/14  3:50 AM  Result Value Ref Range   Sodium 138 135 - 145 mmol/L   Potassium 5.5 (H) 3.5 - 5.1 mmol/L    Comment: NO VISIBLE HEMOLYSIS   Chloride 106 101 - 111 mmol/L   CO2 14 (L) 22 - 32 mmol/L   Glucose, Bld 52 (L) 65 - 99 mg/dL   BUN 134 (H) 6 - 20 mg/dL    Comment: RESULTS CONFIRMED BY MANUAL DILUTION   Creatinine, Ser 4.75 (H) 0.61 - 1.24 mg/dL   Calcium 11.0 (H) 8.9 - 10.3 mg/dL   GFR calc non Af Amer 11 (L) >60 mL/min   GFR calc Af Amer 13 (L) >60 mL/min    Comment: (NOTE) The eGFR has been calculated using the CKD EPI equation. This calculation has not been validated in all clinical situations. eGFR's persistently <60 mL/min signify possible Chronic Kidney Disease.    Anion gap 18 (H) 5 - 15  Lactic acid, plasma     Status: None   Collection Time: 08/24/14  3:50 AM  Result Value Ref Range   Lactic Acid, Venous 1.3 0.5 - 2.0 mmol/L  Procalcitonin     Status: None   Collection Time: 08/24/14  3:50 AM  Result Value Ref Range   Procalcitonin 2.23 ng/mL    Comment:        Interpretation: PCT > 2 ng/mL: Systemic infection (sepsis) is likely, unless other causes are known. (NOTE)         ICU PCT Algorithm               Non ICU PCT Algorithm    ----------------------------     ------------------------------         PCT < 0.25 ng/mL                 PCT < 0.1 ng/mL     Stopping of antibiotics            Stopping of antibiotics       strongly encouraged.                strongly encouraged.    ----------------------------     ------------------------------       PCT level decrease by               PCT < 0.25 ng/mL       >= 80% from peak PCT       OR PCT 0.25 - 0.5 ng/mL          Stopping of antibiotics                                             encouraged.     Stopping of antibiotics  encouraged.    ----------------------------     ------------------------------       PCT level decrease by              PCT >= 0.25 ng/mL       < 80% from peak PCT        AND PCT >= 0.5 ng/mL            Continuing antibiotics                                               encouraged.       Continuing antibiotics            encouraged.    ----------------------------     ------------------------------     PCT level increase compared          PCT > 0.5 ng/mL         with peak PCT AND          PCT >= 0.5 ng/mL             Escalation of antibiotics                                          strongly encouraged.      Escalation of antibiotics        strongly encouraged.   Protime-INR     Status: None   Collection Time: 08/24/14  3:50 AM  Result Value Ref Range   Prothrombin Time 15.2 11.6 - 15.2 seconds   INR 1.18 0.00 - 1.49  APTT     Status: None   Collection Time: 08/24/14  3:50 AM  Result Value Ref Range   aPTT 28 24 - 37 seconds    Dg Chest 2 View  08/24/2014   CLINICAL DATA:  Altered mental status, gluteal ulcer. History of metastatic cancer, COPD.  EXAM: CHEST  2 VIEW  COMPARISON:  CT chest July 14, 2014  FINDINGS: Cardiomediastinal silhouette is nonsuspicious. Increased lung volumes consistent with history of COPD without pleural effusion. Presumed nipple shadow RIGHT lung base. Calcified granuloma. No pneumothorax. Suspected lytic lesion RIGHT humerus versus artifact.  IMPRESSION: COPD without acute cardiopulmonary process.   Electronically Signed   By: Elon Alas M.D.   On: 08/24/2014 00:20   Ct Renal Stone Study  08/24/2014    CLINICAL DATA:  Abdominal pain and leukocytosis  EXAM: CT ABDOMEN AND PELVIS WITHOUT CONTRAST  TECHNIQUE: Multidetector CT imaging of the abdomen and pelvis was performed following the standard protocol without IV contrast.  COMPARISON:  07/14/2014  FINDINGS: There is no evidence of bowel obstruction. No acute inflammatory changes are evident. There is no abscess, ascites or drainable fluid collection.  There is moderate atrophy of both kidneys. There is chronic hydronephrosis and hydroureter bilaterally. There is mural thickening of the ureters and renal collecting systems consistent with chronic pyelonephritis.  There is marked irregularity of the urinary bladder with mass like nodularity and mural thickening. There is a suprapubic urinary bladder catheter.  There are grossly unremarkable unenhanced appearances of the liver and bile ducts. There is a 8 mm calculus within the gallbladder lumen. There are unremarkable unenhanced appearances of the spleen, pancreas and adrenals.  There is progression of skeletal metastatic disease.  The right iliac lesion measures 6.5 x 10.6 by 11.0 cm and previously measured 4.8 x 7.1 by 7.4 cm. The right L5 transverse process mass measures 2.2 x 3.2 x 3.4 cm and previously measured 1.5 x 2.4 x 2.6 cm. There is a new skeletal metastasis involving the inferior left pubic ramus. The right pubic rami CT metastasis is only slightly larger, measuring 3.8 x 4.4 cm and previously measuring 3.4 x 4.3 cm.  There is no significant abnormality in the lower chest.  IMPRESSION: Bilateral renal atrophy with chronic hydronephrosis and hydroureter. Moderate mural thickening of the ureters and collecting systems, consistent with chronic pyelonephritis. The urinary bladder is markedly irregular, perhaps involved with multiple masses. This is not significantly changed from 07/14/2014. Unremarkable suprapubic urinary bladder catheter.  No acute inflammatory changes are evident in the abdomen or pelvis.   Negative for abscess, extraluminal air, or bowel obstruction.  Cholelithiasis  Progression of skeletal metastatic disease.   Electronically Signed   By: Andreas Newport M.D.   On: 08/24/2014 05:14   Dg Femur, Min 2 Views Right  08/24/2014   CLINICAL DATA:  Right hip and leg pain. Vague history of recent fall.  EXAM: RIGHT FEMUR 2 VIEWS  COMPARISON:  None.  FINDINGS: There is no evidence of fracture or other focal bone lesions. Soft tissues are unremarkable.  IMPRESSION: Negative.   Electronically Signed   By: Andreas Newport M.D.   On: 08/24/2014 00:21    Review of Systems  Constitutional: Positive for weight loss and malaise/fatigue.  HENT: Negative.   Eyes: Negative.   Respiratory: Positive for cough. Negative for hemoptysis, sputum production and shortness of breath.   Cardiovascular: Negative.   Gastrointestinal: Positive for heartburn and nausea. Negative for vomiting, abdominal pain and diarrhea.  Genitourinary: Negative for urgency and flank pain.       I think I may have a UTI-some suprapubic discomfort  Musculoskeletal: Positive for myalgias and back pain.  Skin: Negative.   Neurological: Positive for weakness. Negative for dizziness and sensory change.  Psychiatric/Behavioral: The patient is nervous/anxious.    Blood pressure 157/133, pulse 69, temperature 97.4 F (36.3 C), temperature source Oral, resp. rate 20, height $RemoveBe'6\' 5"'oAugOdQga$  (1.956 m), weight 54.432 kg (120 lb), SpO2 100 %. Physical Exam  Nursing note and vitals reviewed. Constitutional: He is oriented to person, place, and time. He appears well-developed.  Cachectic man who appears to be uncomfortable laying in bed  HENT:  Head: Normocephalic and atraumatic.  Nose: Nose normal.  Eyes: EOM are normal. Pupils are equal, round, and reactive to light. No scleral icterus.  Neck: Normal range of motion. Neck supple. No JVD present.  Cardiovascular: Regular rhythm.  Exam reveals no friction rub.   No murmur heard. Regular  tachycardia  Respiratory: Effort normal and breath sounds normal. He has no wheezes. He has no rales.  GI: Soft. Bowel sounds are normal. He exhibits no distension. There is no tenderness.  Musculoskeletal: Normal range of motion. He exhibits no edema.  Neurological: He is alert and oriented to person, place, and time. No cranial nerve deficit.  No asterixis/myoclonic jerks  Skin: Skin is warm and dry. No erythema.    Assessment/Plan: 1. Acute renal failure on chronic kidney disease stage IV: May possibly be due to hemodynamic mechanism/poor oral intake and intravascular volume contraction with recent radiation therapy/poor oral intake/hypercalcemia. We'll attempt intravenous fluids cautiously and I have had a long discussion with the patient and informed him that it would not  be in his best interest to undertake dialysis-he agrees with this and does not want to pursue dialysis as he understands that this might be long-term and his underlying malignancy remains metastatic and untreatable. 2. Hyperkalemia: Treated overnight with Kayexalate with some improvement-switch intravenous fluids 3. Anion gap metabolic acidosis: Secondary to acute renal failure, discontinue oral sodium bicarbonate and switch intravenous fluids to isotonic sodium bicarbonate 4. UTI with sepsis: Ongoing antibiotic therapy of UTI, continue to monitor-he is chronically had multiple leukocytes in his urine and may be colonized chronically. 5. 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 08/24/2014, 7:14 AM

## 2014-08-24 NOTE — ED Notes (Signed)
Please call Wendee Beavers [phone] (cell) if patients condition changes. This is patients friend.

## 2014-08-24 NOTE — Consult Note (Signed)
Palliative Care consult was completed at bedside with [name] alone, he indicates that he is his own decision maker and does not have any immediate family or person(s) who provide support.  Upon entering the room [name] was sitting up in the recliner chair.  He answers to name and is oriented to person, place and time.  He appears weak is unable to adjust his position in the chair without significant assistance, his food tray is untouched at bedside.    He was able to articulate accurately the events of the past few months including his admission at [facility] [place] [facility] for UTI and the subsequent metastatic cancer diagnosis he was given. He also verbalized his dx of his kidney disease. As noted in the H&P he was discharged from [facility] to [place] for rehab.  The significance of the conversation appears to be the mostly related to a lack of social support.  [name] does not have a home, he lives in a boarding type situation renting a "room" where he was discharged to from SNF on 6/4.  He was subsequently sent by EMS to [facility] ED on 6/8.  [place] PMT was consulted on the day of discharge from [facility] with recommendations for palliative care services to follow at SNF.  (refer to full note for details)  His condition has declined rapidly over the past several weeks despite rehab attempts at [facility].  He has been taking Roxanol for pain that he reports mostly in his right leg/hip.  Long discussion took place with [name] about his condition.  He does acknowledge that he does not want radiation treatment or any follow-up for his cancer diagnosis, he also realizes that his kidney disease is worsening and likely the cause of his overall decline.  He wants to get better, we discussed the limits of medicine and that dignitiy and relief of suffering are important to him.  He is open to hospice care for symptom management and the possibility of return to SNF if possible.  It is difficult to determine whether the SNF helped him  explore medicaid application for additional benefits  He began to have difficulty staying awake after about 30 minutes of conversation, will follow up tomorrow.  Report was relayed to [facility] and LCSW on 2MW.  Very complicated social situation as well as disposition options.    Kizzie Fantasia, RN, MSN, [facility] Palliative Care

## 2014-08-24 NOTE — ED Notes (Signed)
Hospitalist at bedside 

## 2014-08-24 NOTE — Evaluation (Signed)
Occupational Therapy Evaluation Patient Details Name: [name] MRN: [id number] DOB: Mar 23, 1940 Today's Date: 08/24/2014    History of Present Illness Pt admitted with weakness, decreased appetite and nausea. Pt was released from SNF on 07/19/14. 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 Pt with UTI and chronic suprapubic catheter.  Having profuse diarrhea.   Clinical Impression   Pt is dependent in all ADL with min to total assist required.  He requires +2 assist for squat pivot transfer and is not ambulatory with increased hip pain.  Pt will need SNF upon discharge.  Will defer further OT to SNF.    Follow Up Recommendations  SNF;Supervision/Assistance - 24 hour    Equipment Recommendations       Recommendations for Other Services       Precautions / Restrictions Precautions Precautions: Fall Precaution Comments: rectal tube      Mobility Bed Mobility Overal bed mobility: +2 for physical assistance;Needs Assistance Bed Mobility: Rolling;Sidelying to Sit Rolling: Min assist Sidelying to sit: +2 for physical assistance;Max assist          Transfers Overall transfer level: Needs assistance   Transfers: Squat Pivot Transfers     Squat pivot transfers: +2 physical assistance;Mod assist     General transfer comment: pivots feet, flexed posture    Balance Overall balance assessment: Needs assistance   Sitting balance-Leahy Scale: Poor       Standing balance-Leahy Scale: Zero                              ADL Overall ADL's : Needs assistance/impaired Eating/Feeding: Minimal assistance;Sitting Eating/Feeding Details (indicate cue type and reason): eating with fingers, assist to open packages Grooming: Moderate assistance;Sitting                                 General ADL Comments: Total  assist to bathe and change gown after BM.      Vision     Perception     Praxis      Pertinent Vitals/Pain Pain Assessment: Faces Faces Pain Scale: Hurts whole lot Pain Location: R hip Pain Intervention(s): Limited activity within patient's tolerance;Monitored during session;Repositioned     Hand Dominance Right   Extremity/Trunk Assessment Upper Extremity Assessment Upper Extremity Assessment: Generalized weakness   Lower Extremity Assessment Lower Extremity Assessment: Defer to PT evaluation       Communication Communication Communication: No difficulties   Cognition Arousal/Alertness: Awake/alert Behavior During Therapy: Flat affect;Anxious Overall Cognitive Status: Impaired/Different from baseline Area of Impairment: Awareness;Safety/judgement;Following commands       Following Commands: Follows one step commands with increased time Safety/Judgement: Decreased awareness of safety;Decreased awareness of deficits     General Comments: Pt rather stoic, does not like to be assisted.   General Comments       Exercises       Shoulder Instructions      Home Living Family/patient expects to be discharged to:: Skilled nursing facility                                 Additional Comments: pt lives in boarding house, decreased social support  Prior Functioning/Environment Level of Independence: Independent with assistive device(s) (prior to admission at the end of April)        Comments: using walker PTA    OT Diagnosis: Generalized weakness;Cognitive deficits;Acute pain   OT Problem List: Decreased strength;Decreased activity tolerance;Impaired balance (sitting and/or standing);Decreased cognition;Decreased safety awareness;Decreased knowledge of use of DME or AE;Impaired UE functional use;Pain   OT Treatment/Interventions:      OT Goals(Current goals can be found in the care plan section) Acute Rehab OT Goals Patient Stated Goal:  wants ice  OT Frequency:     Barriers to D/C:            Co-evaluation PT/OT/SLP Co-Evaluation/Treatment: Yes Reason for Co-Treatment: For patient/therapist safety;Complexity of the patient's impairments (multi-system involvement)   OT goals addressed during session: ADL's and self-care      End of Session Equipment Utilized During Treatment: Gait belt;Oxygen Nurse Communication: Mobility status  Activity Tolerance: Patient limited by pain Patient left: in chair;with call bell/phone within reach;with nursing/sitter in room   Time: 3968-8648 OT Time Calculation (min): 37 min Charges:  OT General Charges $OT Visit: 1 Procedure OT Evaluation $Initial OT Evaluation Tier I: 1 Procedure G-Codes:    Malka So 08/24/2014, 10:35 AM  [phone]

## 2014-08-24 NOTE — Evaluation (Signed)
Physical Therapy Evaluation Patient Details Name: [name] MRN: [id number] DOB: Jun 21, 1940 Today's Date: 08/24/2014   History of Present Illness  Pt admitted with weakness, decreased appetite and nausea. Pt was released from SNF on 07/19/14. 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 Pt with UTI and chronic suprapubic catheter.  Having profuse diarrhea.  Clinical Impression  Pt admitted with above diagnosis. Pt currently with functional limitations due to the deficits listed below (see PT Problem List). At the time of PT eval pt was able to perform transfers with +2 total assistance. Is not safe to return to home situation (?boarding house) and will require further skilled PT services at the SNF level. Pt will benefit from skilled PT to increase their independence and safety with mobility to allow discharge to the venue listed below.      Follow Up Recommendations SNF;Supervision/Assistance - 24 hour    Equipment Recommendations  None recommended by PT    Recommendations for Other Services       Precautions / Restrictions Precautions Precautions: Fall Precaution Comments: rectal tube Restrictions Weight Bearing Restrictions: No      Mobility  Bed Mobility Overal bed mobility: +2 for physical assistance;Needs Assistance Bed Mobility: Rolling;Sidelying to Sit Rolling: Min assist Sidelying to sit: +2 for physical assistance;Max assist          Transfers Overall transfer level: Needs assistance Equipment used: 2 person hand held assist Transfers: Squat Pivot Transfers     Squat pivot transfers: +2 physical assistance;Mod assist     General transfer comment: pivots feet, flexed posture  Ambulation/Gait             General Gait Details: Deferred at this time due to pt fatigue  Stairs            Wheelchair  Mobility    Modified Rankin (Stroke Patients Only)       Balance Overall balance assessment: Needs assistance   Sitting balance-Leahy Scale: Poor     Standing balance support: Bilateral upper extremity supported Standing balance-Leahy Scale: Zero                               Pertinent Vitals/Pain Pain Assessment: Faces Faces Pain Scale: Hurts whole lot Pain Location: R hip - wound area Pain Intervention(s): Limited activity within patient's tolerance;Monitored during session;Repositioned    Home Living Family/patient expects to be discharged to:: Skilled nursing facility                 Additional Comments: pt lives in boarding house, decreased social support    Prior Function Level of Independence: Independent with assistive device(s) (prior to admission at the end of April)         Comments: using walker PTA     Hand Dominance   Dominant Hand: Right    Extremity/Trunk Assessment   Upper Extremity Assessment: Defer to OT evaluation           Lower Extremity Assessment: Generalized weakness      Cervical / Trunk Assessment: Kyphotic (Forward head posture)  Communication   Communication: No difficulties  Cognition Arousal/Alertness: Awake/alert Behavior During Therapy: Flat affect;Anxious Overall Cognitive Status: Impaired/Different from baseline Area of Impairment: Awareness;Safety/judgement;Following commands       Following Commands: Follows one step commands  with increased time Safety/Judgement: Decreased awareness of safety;Decreased awareness of deficits     General Comments: Pt rather stoic, does not like to be assisted.    General Comments      Exercises        Assessment/Plan    PT Assessment Patient needs continued PT services  PT Diagnosis Difficulty walking;Generalized weakness   PT Problem List Decreased strength;Decreased range of motion;Decreased activity tolerance;Decreased balance;Decreased  mobility;Decreased knowledge of use of DME;Decreased safety awareness;Decreased knowledge of precautions;Cardiopulmonary status limiting activity;Pain;Decreased skin integrity  PT Treatment Interventions DME instruction;Gait training;Stair training;Functional mobility training;Therapeutic activities;Therapeutic exercise;Neuromuscular re-education;Patient/family education   PT Goals (Current goals can be found in the Care Plan section) Acute Rehab PT Goals Patient Stated Goal: wants ice PT Goal Formulation: With patient Time For Goal Achievement: 09/07/14 Potential to Achieve Goals: Good    Frequency Min 2X/week   Barriers to discharge Decreased caregiver support      Co-evaluation PT/OT/SLP Co-Evaluation/Treatment: Yes Reason for Co-Treatment: Complexity of the patient's impairments (multi-system involvement);For patient/therapist safety PT goals addressed during session: Mobility/safety with mobility;Balance OT goals addressed during session: ADL's and self-care       End of Session Equipment Utilized During Treatment: Gait belt;Oxygen Activity Tolerance: Patient limited by fatigue Patient left: in chair;with call bell/phone within reach;with nursing/sitter in room Nurse Communication: Mobility status         Time: 9038-3338 PT Time Calculation (min) (ACUTE ONLY): 37 min   Charges:   PT Evaluation $Initial PT Evaluation Tier I: 1 Procedure     PT G Codes:        Rolinda Roan 09-07-2014, 11:58 AM   Rolinda Roan, PT, DPT Acute Rehabilitation Services Pager: [phone]

## 2014-08-24 NOTE — ED Notes (Signed)
Provided water with permission from Dr. Julious Oka.

## 2014-08-24 NOTE — Progress Notes (Signed)
Patient to be transferred to 5W room 14.  Report called to Riccardo Dubin, RN at this time.  Will continue to monitor.

## 2014-08-24 NOTE — ED Provider Notes (Signed)
CSN: [id number]     Arrival date & time 08/23/14  2059 History   First MD Initiated Contact with Patient 08/23/14 2124     Chief Complaint  Patient presents with  . Weakness  . 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  He was recently admitted and discharged back to skilled nursing facility where he was but since Saturday he was discharged from the nursing facility back home.  Patient does have home health coming but was found today by his landlord sitting in a corner.  Patient reports increasing pain in his proximal right femur where he is known to have lytic lesions.  Past Medical History  Diagnosis Date  . COPD (chronic obstructive pulmonary disease)   . Cancer    Past Surgical History  Procedure Laterality Date  . Abdominal surgery    . Radiology with anesthesia N/A 07/18/2014    Procedure: MRI - T-Spine without Contrast;  Surgeon: Medication Radiologist, MD;  Location: [place];  Service: Radiology;  Laterality: N/A;  . Colonoscopy     History reviewed. No pertinent family history. History  Substance Use Topics  . Smoking status: Former Research scientist (life sciences)  . Smokeless tobacco: Not on file  . Alcohol Use: No    Review of Systems  All other systems reviewed and are negative.     Allergies  Review of patient's allergies indicates no known allergies.  Home Medications   Prior to Admission medications   Medication Sig Start Date End Date Taking? Authorizing Provider  dexamethasone (DECADRON) 4 MG tablet Take 1 tablet (4 mg total) by mouth every 12 (twelve) hours. 07/20/14  Yes Thurnell Lose, MD  LORazepam (ATIVAN) 0.5 MG tablet Take 0.5 mg by mouth every 6 (six) hours as needed for anxiety. for anxiety 08/09/14  Yes Historical Provider, MD  Morphine Sulfate (MORPHINE CONCENTRATE) 10 MG/0.5ML SOLN  concentrated solution Take 0.5 mLs (10 mg total) by mouth every 3 (three) hours as needed for moderate pain or severe pain. 07/20/14  Yes Thurnell Lose, MD  omeprazole (PRILOSEC) 20 MG capsule Take 20 mg by mouth daily.   Yes Historical Provider, MD  sodium bicarbonate 650 MG tablet Take 1 tablet by mouth 2 (two) times daily. 05/24/14  Yes Historical Provider, MD  trolamine salicylate (ASPERCREME) 10 % cream Apply 1 application topically as needed for muscle pain.   Yes Historical Provider, MD  ferrous sulfate 325 (65 FE) MG tablet Take 1 tablet (325 mg total) by mouth daily with breakfast. Patient not taking: Reported on 08/23/2014 10/10/13   Kinnie Feil, MD  LORazepam (ATIVAN) 2 MG/ML concentrated solution Take 0.5 mLs (1 mg total) by mouth every 6 (six) hours as needed for anxiety. Patient not taking: Reported on 08/23/2014 07/20/14   Thurnell Lose, MD  Nutritional Supplements (FEEDING SUPPLEMENT, NEPRO CARB STEADY,) LIQD Take 237 mLs by mouth 3 (three) times daily between meals. Patient not taking: Reported on 08/23/2014 07/20/14   Thurnell Lose, MD   BP 112/72 mmHg  Pulse 108  Temp(Src) 97.1 F (36.2 C) (Rectal)  Resp 17  Ht '6\' 5"'$  (1.956 m)  Wt 120 lb (54.432 kg)  BMI 14.23 kg/m2 Physical Exam  Constitutional: He is oriented to person, place, and time. He appears well-developed and well-nourished.  Cachectic.  Chronically ill-appearing.  HENT:  Head:  Normocephalic and atraumatic.  Eyes: EOM are normal.  Neck: Normal range of motion.  Cardiovascular: Normal rate, regular rhythm, normal heart sounds and intact distal pulses.   Pulmonary/Chest: Effort normal and breath sounds normal. No respiratory distress.  Abdominal: Soft. He exhibits no distension. There is no tenderness.  Suprapubic catheter in place without surrounding signs of infection.  Urine in the tubing is stagnant and discolored  Musculoskeletal: Normal range of motion.  Full range of motion of right hip and right knee.   Normal pulses in right foot.  Neurological: He is alert and oriented to person, place, and time.  Skin: Skin is warm and dry.  Psychiatric: He has a normal mood and affect. Judgment normal.  Nursing note and vitals reviewed.   ED Course  SUPRAPUBIC BLADDER CATHETER EXCHANGE Performed by: Jola Schmidt Authorized by: Jola Schmidt Consent: Verbal consent obtained. Risks and benefits: risks, benefits and alternatives were discussed Consent given by: patient Required items: required blood products, implants, devices, and special equipment available Indications comment: suprapubic catheter exchange Local anesthesia used: no Patient sedated: no Preparation: Patient was prepped and draped in the usual sterile fashion. Catheter insertion: suprapubic exchange. Catheter type: Foley Catheter size: 16 Fr Complicated insertion: no Number of attempts: 1 Urine volume: 0 ml Patient tolerance: Patient tolerated the procedure well with no immediate complications   (including critical care time) Labs Review Labs Reviewed  URINE CULTURE  CBC WITH DIFFERENTIAL/PLATELET  COMPREHENSIVE METABOLIC PANEL  URINALYSIS, ROUTINE W REFLEX MICROSCOPIC (NOT AT [facility])  LACTIC ACID, PLASMA    Imaging Review No results found.   EKG Interpretation None      MDM   Final diagnoses:  Pain    Patient will undergo labs.  IV fluids now.  Catheter was exchanged as the mature and it seems stagnant nose not convinced that it was working properly.  Suprapubic catheter was exchanged without difficulty.  Care to Dr Annia Friendly, MD 08/24/14 [phone]

## 2014-08-24 NOTE — Progress Notes (Signed)
6:08 PM I agree with HPI/GPe and A/P per Dr. Porfirio Mylar       74 y/o ? 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 Benay Spice at [facility] He decided to forego XRT and has vacillated between fll comfort with Hopsice and "I want everything done"   He tells me he spoke with Dr. Posey Pronto and was told that "they cannot give me dialysis" I discussed clearly with the patient that he has a metastatic Ca that would not make him a candidate for dialysis.  He is currently very undecided about what to do and is in a mod-servere amount of pain  HEENT eomi, frail CHEST clear no added sound CARDIAC s1 s 2no m/r/g ABDOMEN soft nt nd  Patient Active Problem List   Diagnosis Date Noted  . GERD (gastroesophageal reflux disease)   . Systolic congestive heart failure   . Acute renal failure superimposed on stage 4 chronic kidney disease   . Gastroesophageal reflux disease with esophagitis   . Congestive heart disease   . Palliative care encounter 07/24/2014  . Weakness generalized 07/24/2014  . Cancer   . Lytic bone lesions on xray   . Metastasis to spine 07/14/2014  . Sepsis 07/10/2014  . Hypercalcemia   . Hyperkalemia   . Hypoglycemia   . Protein-calorie malnutrition, severe 10/08/2013  . Metabolic acidosis 48/27/0786  . UTI (lower urinary tract infection) 10/07/2013  . Failure to thrive in adult 10/07/2013  . Renal failure 10/06/2013  . Hydronephrosis 01/23/2010  . ADENOCARCINOMA, COLON 02/17/2007  . COPD (chronic obstructive pulmonary disease) 02/17/2007  . PYELONEPHRITIS 02/17/2007  . BENIGN PROSTATIC HYPERTROPHY, WITH OBSTRUCTION 02/17/2007   Palliative to re-consult Likely Hospice eligible and full comfort trajectory Dilaudid for pain  control> morphine as CKD Transfer tele--?dispo complicated as lives alone  Verneita Griffes, MD [facility] [phone]

## 2014-08-24 NOTE — ED Provider Notes (Signed)
Dr. Venora Maples at midnight. Patient awaiting labs for evaluation for weakness. Patient has suprapubic catheter changed by Dr. Venora Maples. UA shows UTI. Rocephin given. Labs show acute kidney injury with renal failure with creatinine around 5 and mild hyperkalemia at 6.1. We'll scan his belly to see if there is a possible stone. I spoke with Dr. Blaine Hamper who recommended transfer to [facility] to be seen by nephrology. Patient admitted.  1. Gastroesophageal reflux disease with esophagitis   2. Pain   3. Acute renal failure   4. Systolic congestive heart failure, unspecified congestive heart failure chronicity   5. CKD (chronic kidney disease), stage IV      Evelina Bucy, MD 08/24/14 [phone]

## 2014-08-24 NOTE — Progress Notes (Signed)
Pt admitted to the unit. Pt is stable and alert. Oriented best to room, staff, and call bell. Educated to call for any assistance. Bed in lowest position, call bell within reach- will continue to monitor.

## 2014-08-24 NOTE — Progress Notes (Signed)
08/24/2014 0620  Patient admitted from [facility] ED- stepdown acuity for possible HD due to on going elevated creatinine. Baltazar Najjar NP notified of patient arrival. Pt admitting FSBS 41 which was treated with D50 IVP; Copious loose stools from kayexalate- rectal tube placed.  Mick Sell RN

## 2014-08-24 NOTE — Care Management Note (Signed)
Case Management Note  Patient Details  Name: Tayo Maute MRN: [id number] Date of Birth: [birth year]  Subjective/Objective:    Patient was recently discharged home from SNF with [facility], but is very weak.  Will probably need rehab prior to going home.  ?? [facility] or SNF depending on progression                Action/Plan:   Expected Discharge Date:                  Expected Discharge Plan:  Long Term Acute Care (LTAC)  In-House Referral:     Discharge planning Services     Post Acute Care Choice:    Choice offered to:     DME Arranged:    DME Agency:     HH Arranged:    HH Agency:     Status of Service:  In process, will continue to follow  Medicare Important Message Given:    Date Medicare IM Given:    Medicare IM give by:    Date Additional Medicare IM Given:    Additional Medicare Important Message give by:     If discussed at [place] of Stay Meetings, dates discussed:    Additional Comments:  Vergie Living, RN 08/24/2014, 9:14 AM

## 2014-08-25 DIAGNOSIS — Z515 Encounter for palliative care: Secondary | ICD-10-CM

## 2014-08-25 DIAGNOSIS — E162 Hypoglycemia, unspecified: Secondary | ICD-10-CM

## 2014-08-25 LAB — BASIC METABOLIC PANEL
Anion gap: 14 (ref 5–15)
BUN: 90 mg/dL — ABNORMAL HIGH (ref 6–20)
CO2: 29 mmol/L (ref 22–32)
Calcium: 9.2 mg/dL (ref 8.9–10.3)
Chloride: 99 mmol/L — ABNORMAL LOW (ref 101–111)
Creatinine, Ser: 3.29 mg/dL — ABNORMAL HIGH (ref 0.61–1.24)
GFR calc Af Amer: 20 mL/min — ABNORMAL LOW (ref >60)
GFR, EST NON AFRICAN AMERICAN: 17 mL/min — AB (ref >60)
Glucose, Bld: 141 mg/dL — ABNORMAL HIGH (ref 65–99)
POTASSIUM: 2.8 mmol/L — AB (ref 3.5–5.1)
Sodium: 142 mmol/L (ref 135–145)

## 2014-08-25 LAB — GLUCOSE, CAPILLARY: Glucose-Capillary: 122 mg/dL — ABNORMAL HIGH (ref 65–99)

## 2014-08-25 LAB — SODIUM, URINE, RANDOM: Sodium, Ur: 60 mEq/L

## 2014-08-25 LAB — CREATININE, URINE, RANDOM: CREATININE, URINE: 11.5 mg/dL

## 2014-08-25 MED ORDER — TROLAMINE SALICYLATE 10 % EX CREA
TOPICAL_CREAM | Freq: Two times a day (BID) | CUTANEOUS | Status: DC | PRN
  Filled 2014-08-25: qty 85

## 2014-08-25 MED ORDER — MUSCLE RUB 10-15 % EX CREA
TOPICAL_CREAM | CUTANEOUS | Status: DC | PRN
  Filled 2014-08-25: qty 85

## 2014-08-25 MED ORDER — LIDOCAINE 5 % EX PTCH
1.0000 | MEDICATED_PATCH | CUTANEOUS | Status: DC
  Administered 2014-08-25 – 2014-08-28 (×4): 1 via TRANSDERMAL
  Filled 2014-08-25 (×4): qty 1

## 2014-08-25 MED ORDER — ENSURE ENLIVE PO LIQD
237.0000 mL | Freq: Three times a day (TID) | ORAL | Status: DC
  Administered 2014-08-25 – 2014-08-27 (×5): 237 mL via ORAL

## 2014-08-25 MED ORDER — POTASSIUM CHLORIDE CRYS ER 20 MEQ PO TBCR
40.0000 meq | EXTENDED_RELEASE_TABLET | Freq: Two times a day (BID) | ORAL | Status: DC
  Administered 2014-08-25: 40 meq via ORAL
  Filled 2014-08-25: qty 2

## 2014-08-25 MED ORDER — TROLAMINE SALICYLATE 10 % EX CREA
TOPICAL_CREAM | CUTANEOUS | Status: DC | PRN
Start: 2014-08-25 — End: 2014-08-25

## 2014-08-25 NOTE — Progress Notes (Signed)
S: Co shoulder pain O:BP 108/61 mmHg  Pulse 95  Temp(Src) 98.2 F (36.8 C) (Oral)  Resp 18  Ht '6\' 5"'$  (1.956 m)  Wt 52.708 kg (116 lb 3.2 oz)  BMI 13.78 kg/m2  SpO2 100%  Intake/Output Summary (Last 24 hours) at 08/25/14 0901 Last data filed at 08/25/14 0700  Gross per 24 hour  Intake   2300 ml  Output   1095 ml  Net   1205 ml   Weight change: -1.724 kg (-3 lb 12.8 oz) Gen:  Awake and alert CVS:RRR Resp:clear Abd: + BS NTND Ext: no edema NEURO: CNI, Ox3, no asterixis   . cefTRIAXone (ROCEPHIN)  IV  1 g Intravenous Q24H  . dexamethasone  4 mg Oral Q12H  . feeding supplement (NEPRO CARB STEADY)  237 mL Oral TID BM  . heparin  5,000 Units Subcutaneous 3 times per day  . pantoprazole  40 mg Oral Daily  . sodium chloride  3 mL Intravenous Q12H  . sodium polystyrene  15 g Oral Once   Dg Chest 2 View  08/24/2014   CLINICAL DATA:  Altered mental status, gluteal ulcer. History of metastatic cancer, COPD.  EXAM: CHEST  2 VIEW  COMPARISON:  CT chest July 14, 2014  FINDINGS: Cardiomediastinal silhouette is nonsuspicious. Increased lung volumes consistent with history of COPD without pleural effusion. Presumed nipple shadow RIGHT lung base. Calcified granuloma. No pneumothorax. Suspected lytic lesion RIGHT humerus versus artifact.  IMPRESSION: COPD without acute cardiopulmonary process.   Electronically Signed   By: Elon Alas M.D.   On: 08/24/2014 00:20   Ct Renal Stone Study  08/24/2014   CLINICAL DATA:  Abdominal pain and leukocytosis  EXAM: CT ABDOMEN AND PELVIS WITHOUT CONTRAST  TECHNIQUE: Multidetector CT imaging of the abdomen and pelvis was performed following the standard protocol without IV contrast.  COMPARISON:  07/14/2014  FINDINGS: There is no evidence of bowel obstruction. No acute inflammatory changes are evident. There is no abscess, ascites or drainable fluid collection.  There is moderate atrophy of both kidneys. There is chronic hydronephrosis and hydroureter  bilaterally. There is mural thickening of the ureters and renal collecting systems consistent with chronic pyelonephritis.  There is marked irregularity of the urinary bladder with mass like nodularity and mural thickening. There is a suprapubic urinary bladder catheter.  There are grossly unremarkable unenhanced appearances of the liver and bile ducts. There is a 8 mm calculus within the gallbladder lumen. There are unremarkable unenhanced appearances of the spleen, pancreas and adrenals.  There is progression of skeletal metastatic disease. The right iliac lesion measures 6.5 x 10.6 by 11.0 cm and previously measured 4.8 x 7.1 by 7.4 cm. The right L5 transverse process mass measures 2.2 x 3.2 x 3.4 cm and previously measured 1.5 x 2.4 x 2.6 cm. There is a new skeletal metastasis involving the inferior left pubic ramus. The right pubic rami CT metastasis is only slightly larger, measuring 3.8 x 4.4 cm and previously measuring 3.4 x 4.3 cm.  There is no significant abnormality in the lower chest.  IMPRESSION: Bilateral renal atrophy with chronic hydronephrosis and hydroureter. Moderate mural thickening of the ureters and collecting systems, consistent with chronic pyelonephritis. The urinary bladder is markedly irregular, perhaps involved with multiple masses. This is not significantly changed from 07/14/2014. Unremarkable suprapubic urinary bladder catheter.  No acute inflammatory changes are evident in the abdomen or pelvis.  Negative for abscess, extraluminal air, or bowel obstruction.  Cholelithiasis  Progression of skeletal metastatic  disease.   Electronically Signed   By: Andreas Newport M.D.   On: 08/24/2014 05:14   Dg Femur, Min 2 Views Right  08/24/2014   CLINICAL DATA:  Right hip and leg pain. Vague history of recent fall.  EXAM: RIGHT FEMUR 2 VIEWS  COMPARISON:  None.  FINDINGS: There is no evidence of fracture or other focal bone lesions. Soft tissues are unremarkable.  IMPRESSION: Negative.    Electronically Signed   By: Andreas Newport M.D.   On: 08/24/2014 00:21   BMET    Component Value Date/Time   NA 138 08/24/2014 0350   K 5.5* 08/24/2014 0350   CL 106 08/24/2014 0350   CO2 14* 08/24/2014 0350   GLUCOSE 52* 08/24/2014 0350   BUN 134* 08/24/2014 0350   CREATININE 4.75* 08/24/2014 0350   CALCIUM 11.0* 08/24/2014 0350   GFRNONAA 11* 08/24/2014 0350   GFRAA 13* 08/24/2014 0350   CBC    Component Value Date/Time   WBC 17.5* 08/24/2014 0350   RBC 4.52 08/24/2014 0350   HGB 9.5* 08/24/2014 0350   HCT 31.4* 08/24/2014 0350   PLT 308 08/24/2014 0350   MCV 69.5* 08/24/2014 0350   MCH 21.0* 08/24/2014 0350   MCHC 30.3 08/24/2014 0350   RDW 16.9* 08/24/2014 0350   LYMPHSABS 1.6 08/23/2014 2345   MONOABS 0.6 08/23/2014 2345   EOSABS 0.0 08/23/2014 2345   BASOSABS 0.0 08/23/2014 2345     Assessment:  1. Acute on CKD 4, Scr lower yest.  Labs today P 2. Hyperkalemia 3. Hypercalcemia 4. E Coli UTI, on ceftriaxone  Plan: 1.  Await today's labs 2. He is accepting of the fact that he is not a dialysis candidate.  His living situation is not good and he would consider in center hospice if he qualifies.   [name]

## 2014-08-25 NOTE — Progress Notes (Signed)
2 attempts by 2 RN's to replace rectal tube- both times tube came out. Patient stated that although he wants the tube, he does not want a third attempt. Will pass on to day RN.

## 2014-08-25 NOTE — Progress Notes (Signed)
[name] LZJ:[id number] DOB: 1940-11-07 DOA: 08/23/2014 PCP: No primary care provider on file.  Brief narrative:  74 y/o ? 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 He decided to forego XRT and has vacillated between fll comfort with Hopsice and "I want everything done" He tells me he spoke with Dr. Posey Pronto and was told that "they cannot give me dialysis" I discussed clearly with the patient that he has a metastatic Ca that would not make him a candidate for dialysis.  He is currently very undecided about what to do and is in a mod-servere amount of pain  Past medical history-As per Problem list Chart reviewed as below- reviewed  Consultants:  Renal  palliative  Procedures:  none  Antibiotics:  none   Subjective   Pain "much better" with addition of Lidocaine patch Eating fair Doesn't wish to engage in [place] discussion-"is there nothing more that can be done?" when i present him with prognosis of possibly weeks with ESRD   Objective    Interim History:   Telemetry:    Objective: Filed Vitals:   08/24/14 2036 08/25/14 0534 08/25/14 1517 08/25/14 1716  BP: 107/50 108/61 119/59   Pulse: 104 95 86 88  Temp: 98 F (36.7 C) 98.2 F (36.8 C) 97.6 F (36.4 C)   TempSrc: Oral Oral Oral   Resp: $Remo'20 18 20   'MeHpK$ Height:      Weight: 52.708 kg (116 lb 3.2 oz)     SpO2: 100% 100% 100% 98%    Intake/Output Summary (Last 24 hours) at 08/25/14 1723 Last data filed at 08/25/14 1459  Gross per 24 hour  Intake   1740 ml  Output   1720 ml  Net     20 ml    Exam:  General: eomi, frail, no pallor no ict Cardiovascular: s1 s 2no nm/r/g Respiratory: clear no added sound Abdomen: soft, NT,  ND Skin no le edema Neuro intact-pain limits R hip movement  Data Reviewed: Basic Metabolic Panel:  Recent Labs Lab 08/23/14 2345 08/24/14 0350 08/25/14 0920  NA 135 138 142  K 6.1* 5.5* 2.8*  CL 102 106 99*  CO2 13* 14* 29  GLUCOSE 54* 52* 141*  BUN 131* 134* 90*  CREATININE 5.06* 4.75* 3.29*  CALCIUM 11.7* 11.0* 9.2  MG  --  2.8*  --   PHOS  --  7.8*  --    Liver Function Tests:  Recent Labs Lab 08/23/14 2345  AST 20  ALT 15*  ALKPHOS 77  BILITOT 1.0  PROT 8.0  ALBUMIN 2.9*   No results for input(s): LIPASE, AMYLASE in the last 168 hours. No results for input(s): AMMONIA in the last 168 hours. CBC:  Recent Labs Lab 08/23/14 2345 08/24/14 0350  WBC 19.5* 17.5*  NEUTROABS 17.3*  --   HGB 10.4* 9.5*  HCT 34.5* 31.4*  MCV 70.3* 69.5*  PLT 348 308   Cardiac Enzymes: No results for input(s): CKTOTAL, CKMB, CKMBINDEX, TROPONINI in the last 168 hours. BNP: Invalid input(s): POCBNP CBG:  Recent Labs Lab 08/24/14 0616 08/24/14 0805 08/24/14 1015 08/24/14 1129 08/25/14 0751  GLUCAP 55* 81 83 98 122*    Recent Results (from the past 240 hour(s))  Urine culture     Status: None (Preliminary result)   Collection Time: 08/23/14 11:52 PM  Result Value Ref Range Status   Specimen Description URINE, CLEAN CATCH  Final   Special Requests NONE  Final   Colony Count   Final    >=100,000 COLONIES/ML Performed at [facility]    Culture   Final    ESCHERICHIA COLI Performed at [facility]    Report Status PENDING  Incomplete  Culture, blood (x 2)     Status: None (Preliminary result)   Collection Time: 08/24/14  3:50 AM  Result Value Ref Range Status   Specimen Description BLOOD RIGHT FOREARM  Final   Special Requests BOTTLES DRAWN AEROBIC AND ANAEROBIC  Final   Culture   Final           BLOOD CULTURE RECEIVED NO GROWTH TO DATE CULTURE WILL BE HELD FOR 5 DAYS BEFORE ISSUING A FINAL NEGATIVE REPORT Performed at [facility]     Report Status PENDING  Incomplete  Culture, blood (x 2)     Status: None (Preliminary result)   Collection Time: 08/24/14  3:50 AM  Result Value Ref Range Status   Specimen Description BLOOD RIGHT ARM  Final   Special Requests BOTTLES DRAWN AEROBIC AND ANAEROBIC  Final   Culture   Final           BLOOD CULTURE RECEIVED NO GROWTH TO DATE CULTURE WILL BE HELD FOR 5 DAYS BEFORE ISSUING A FINAL NEGATIVE REPORT Performed at [facility]    Report Status PENDING  Incomplete  MRSA PCR Screening     Status: None   Collection Time: 08/24/14  6:41 AM  Result Value Ref Range Status   MRSA by PCR NEGATIVE NEGATIVE Final    Comment:        The GeneXpert MRSA Assay (FDA approved for NASAL specimens only), is one component of a comprehensive MRSA colonization surveillance program. It is not intended to diagnose MRSA infection nor to guide or monitor treatment for MRSA infections.      Studies:              All Imaging reviewed and is as per above notation   Scheduled Meds: . cefTRIAXone (ROCEPHIN)  IV  1 g Intravenous Q24H  . dexamethasone  4 mg Oral Q12H  . feeding supplement (ENSURE ENLIVE)  237 mL Oral TID BM  . heparin  5,000 Units Subcutaneous 3 times per day  . lidocaine  1 patch Transdermal Q24H  . pantoprazole  40 mg Oral Daily  . potassium chloride  40 mEq Oral BID  . sodium chloride  3 mL Intravenous Q12H   Continuous Infusions: .  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   GERD (gastroesophageal reflux disease)   Systolic congestive heart failure   Acute renal failure superimposed on stage 4 chronic kidney disease   CKD (chronic kidney disease), stage IV    Pain   Acute pyelonephritis  Poor overall prognosis-I will stop checking labs-no more kayexalate, no more potassium Continue decadron scheduled for metastatic disease Pain control with dilaudid as no toxic metabolites from  Morphine in setting ESRD Continune  Lidocaine which offers good topical pain control Patient not ready to address coded status as well as GOC discussion-I will re-engage tomorrow-it seems he is having a hard time with these discussions but I thnk he understands that we do not have any recourse at this stage for him   Code Status: Currently full Family Communication:  None + Disposition Plan: inpatient pending resolution   Verneita Griffes, MD  [facility] Pager [phone] 08/25/2014, 5:23 PM    LOS: 1 day

## 2014-08-25 NOTE — Progress Notes (Signed)
MD Samtani paged asked to order PRN Asper Cream, and d/c current muscle cream. The patient has requested this and prefers it for muscle pain control.

## 2014-08-25 NOTE — Progress Notes (Signed)
Patient pulled out rectal tube. Rn assessed patient and NT cleaned him up. Patient asked that it be replaced. Will replace tube

## 2014-08-25 NOTE — Progress Notes (Signed)
Discussion at bedside with [name].  He is willing to consent to hospice evaluation for eligibility as he is does not want any further evaluation or treatment for his met. Cancer diagnosis AND he acknowledges the discussion with nephrology regarding his kidney failure as an irreversible terminal condition.  His social situation and lack of emotional support continue to stunt his ability to fully engage in definitive goals of care.    When code status discussion was approached he began to withdraw and reported that he was in pain and could not discuss the seriousness of the decision right now.  We were finally able to get a lidocaine patch ordered for his right hip pain topically applied which he reports, takes care of the pain better than any pain pill.    He verbalizes that if he can get a good night sleep he would be able to make a better decision.    His appetite is very poor, PO intake is minimal to liquids only at this point.  His condition is terminal and his prognosis could be days to weeks, possibly 3-4.    Again, I believe that his social situation requires as much attention as symptom management and that his total pain assessment includes signficant emotional suffering.   Discussion communicated to CSW for hospice eligibility and continued support.  PMT will follow for transition as needed.  Kizzie Fantasia, MSN, RN, [place]

## 2014-08-25 NOTE — Progress Notes (Signed)
Initial Nutrition Assessment  DOCUMENTATION CODES:  Underweight, Severe malnutrition in context of chronic illness  INTERVENTION:  Ensure Enlive po TID, each supplement provides 350 kcal and 20 grams of protein  NUTRITION DIAGNOSIS:  Malnutrition related to chronic illness as evidenced by severe depletion of body fat, severe depletion of muscle mass.  GOAL:  Patient will meet greater than or equal to 90% of their needs   MONITOR:  PO intake, Supplement acceptance, Labs, I & O's, Weight trends, Skin (Goals of care)  REASON FOR ASSESSMENT:  Malnutrition Screening Tool    ASSESSMENT: [name] is a 74 y.o. male with PMH of GERD, CODD, CKD stg4-5, BPH, Adeno CA Colon (s/p hemicolectomy), suprapubic Cath for urethral stricture mild non-obs CAD, who presents with a generalized weakness and found to be hypoglycemia in ED  Pt in severe pain at time of visit. He reports his appetite was good PTA, however, has declined since hospitalization. He reports, "It takes me a few days to get acclimated here, then I start to eat ok". Noted pt consumed only one bite of sausage off breakfast tray. He is requesting Ensure supplements- RD to order per pt request.   Nutrition-Focused physical exam completed. Findings are severe fat depletion, severe muscle depletion, and no edema.   Per MD notes, plan is to potentially transfer to hospice services if pt qualifies.   Height:  Ht Readings from Last 1 Encounters:  08/23/14 '6\' 5"'$  (1.956 m)    Weight:  Wt Readings from Last 1 Encounters:  08/24/14 116 lb 3.2 oz (52.708 kg)    Ideal Body Weight:  94.5 kg  Wt Readings from Last 10 Encounters:  08/24/14 116 lb 3.2 oz (52.708 kg)  07/12/14 121 lb 4.1 oz (55 kg)  10/10/13 125 lb 7.1 oz (56.9 kg)  03/29/08 163 lb (73.936 kg)  02/17/07 152 lb (68.947 kg)    BMI:  Body mass index is 13.78 kg/(m^2). Underweight  Estimated Nutritional Needs:  Kcal:  1600-1800  Protein:  65-75  grams  Fluid:  1.6-1.8 L  Skin:  Reviewed, no issues (open lt hip abrasion)  Diet Order:  Diet regular Room service appropriate?: Yes; Fluid consistency:: Thin  EDUCATION NEEDS:  No education needs identified at this time   Intake/Output Summary (Last 24 hours) at 08/25/14 1245 Last data filed at 08/25/14 1224  Gross per 24 hour  Intake   2240 ml  Output   1670 ml  Net    570 ml    Last BM:  08/25/14  Cambrea Kirt A. Jimmye Norman, RD, LDN, CDE Pager: [phone] After hours Pager: [phone]

## 2014-08-25 NOTE — Progress Notes (Signed)
Spoke with Pershing Proud from [place]. Recommendation is for patient to be discharged to an residential hospice facility. Home health hospice would be unreliable due to the fact that patient is residing in a boarding home. Recommendation passed on to [place] states she is aware and working on case.

## 2014-08-25 NOTE — Clinical Social Work Note (Signed)
Clinical Social Worker attempted to assess patient at bedside to residential hospice placement.  Patient refuses to speak with CSW at this time as he states that he is in too much pain.  CSW spoke with Palliative RN who also spoke with patient and patient reiterated that he is in too much pain and will need continue conversation tomorrow.  Per Palliative RN, patient did provide permission to inquire about patient eligibility for residential hospice placement.  Patient remains as a full code and not willing to address code status further at this time.  CSW to notify [place].  Patient with no family to address concerns.  CSW to follow up and complete assessment when patient willing to engage in process.  CSW remains available for support and to assist with discharge planning needs.  Barbette Or, [place]

## 2014-08-25 NOTE — Progress Notes (Signed)
It is my opinion that this patient has a prognosis of less than 3 weeks of life and might qualify for inpatient Hospice care at a facility.  Verneita Griffes, MD [facility] [phone]

## 2014-08-26 DIAGNOSIS — R6520 Severe sepsis without septic shock: Secondary | ICD-10-CM

## 2014-08-26 LAB — URINE CULTURE: Colony Count: >=100000

## 2014-08-26 LAB — BASIC METABOLIC PANEL
ANION GAP: 14 (ref 5–15)
BUN: 60 mg/dL — AB (ref 6–20)
CO2: 34 mmol/L — AB (ref 22–32)
CREATININE: 2.65 mg/dL — AB (ref 0.61–1.24)
Calcium: 8.9 mg/dL (ref 8.9–10.3)
Chloride: 90 mmol/L — ABNORMAL LOW (ref 101–111)
GFR, EST AFRICAN AMERICAN: 26 mL/min — AB (ref >60)
GFR, EST NON AFRICAN AMERICAN: 22 mL/min — AB (ref >60)
GLUCOSE: 132 mg/dL — AB (ref 65–99)
POTASSIUM: 2.9 mmol/L — AB (ref 3.5–5.1)
Sodium: 138 mmol/L (ref 135–145)

## 2014-08-26 LAB — GLUCOSE, CAPILLARY: GLUCOSE-CAPILLARY: 146 mg/dL — AB (ref 65–99)

## 2014-08-26 MED ORDER — POTASSIUM CHLORIDE CRYS ER 20 MEQ PO TBCR: 40.0000 meq | EXTENDED_RELEASE_TABLET | Freq: Once | ORAL | Status: DC

## 2014-08-26 MED ORDER — SODIUM CHLORIDE 0.9 % IV SOLN
0.5000 g | INTRAVENOUS | Status: DC
  Administered 2014-08-26 – 2014-08-27 (×2): 0.5 g via INTRAVENOUS
  Filled 2014-08-26 (×3): qty 0.5

## 2014-08-26 MED ORDER — POTASSIUM CHLORIDE CRYS ER 20 MEQ PO TBCR
40.0000 meq | EXTENDED_RELEASE_TABLET | Freq: Once | ORAL | Status: AC
  Administered 2014-08-26: 40 meq via ORAL
  Filled 2014-08-26: qty 2

## 2014-08-26 NOTE — Progress Notes (Signed)
[name] HYW:[id number] DOB: [birth year] DOA: 08/23/2014 PCP: No primary care provider on file.  Brief narrative:  74 y/o ? 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ay [facility] He decided to forego XRT and has vacillated between fll comfort with Hopsice and "I want everything done" He tells me he spoke with Dr. Posey Pronto and was told that "they cannot give me dialysis" I discussed clearly with the patient that he has a metastatic Ca that would not make him a candidate for dialysis.  He is currently very undecided about what to do and is in a mod-servere amount of pain  Past medical history-As per Problem list Chart reviewed as below- reviewed  Consultants:  Renal  palliative  Procedures:  none  Antibiotics:  none   Subjective   Doing fair. Alert oriented. Tolerating diet. Pain is better with lidocaine patch Understands he has limited choices Trying to contemplate where he needs to go Asking me if he will be able to mobilize at hospice   Objective    Interim History:   Telemetry:    Objective: Filed Vitals:   08/25/14 2258 08/26/14 0706 08/26/14 0716 08/26/14 1328  BP: 110/53  118/64 125/72  Pulse: 93  84 81  Temp: 97.6 F (36.4 C)  98.1 F (36.7 C) 98.6 F (37 C)  TempSrc: Oral  Oral   Resp: $Remo'18  22 18  'BcUIF$ Height:      Weight:  50.5 kg (111 lb 5.3 oz)    SpO2: 98%  97% 99%    Intake/Output Summary (Last 24 hours) at 08/26/14 2043 Last data filed at 08/26/14 1554  Gross per 24 hour  Intake    490 ml  Output   3150 ml  Net  -2660 ml    Exam:  General: eomi, frail, no pallor no ict, bitemporal wasting Cardiovascular: s1 s 2no nm/r/g Respiratory: clear no added sound Abdomen:  soft, NT, ND Neuro intact-pain limits R hip movement  Data Reviewed: Basic Metabolic Panel:  Recent Labs Lab 08/23/14 2345 08/24/14 0350 08/25/14 0920 08/26/14 1150  NA 135 138 142 138  K 6.1* 5.5* 2.8* 2.9*  CL 102 106 99* 90*  CO2 13* 14* 29 34*  GLUCOSE 54* 52* 141* 132*  BUN 131* 134* 90* 60*  CREATININE 5.06* 4.75* 3.29* 2.65*  CALCIUM 11.7* 11.0* 9.2 8.9  MG  --  2.8*  --   --   PHOS  --  7.8*  --   --    Liver Function Tests:  Recent Labs Lab 08/23/14 2345  AST 20  ALT 15*  ALKPHOS 77  BILITOT 1.0  PROT 8.0  ALBUMIN 2.9*   No results for input(s): LIPASE, AMYLASE in the last 168 hours. No results for input(s): AMMONIA in the last 168 hours. CBC:  Recent Labs Lab 08/23/14 2345 08/24/14 0350  WBC 19.5* 17.5*  NEUTROABS 17.3*  --   HGB 10.4* 9.5*  HCT 34.5* 31.4*  MCV 70.3* 69.5*  PLT 348 308   Cardiac Enzymes: No results for input(s): CKTOTAL, CKMB, CKMBINDEX, TROPONINI in the last 168 hours. BNP: Invalid input(s): POCBNP CBG:  Recent Labs Lab 08/24/14 0805 08/24/14 1015 08/24/14 1129 08/25/14 0751 08/26/14 0757  GLUCAP 81 83  98 122* 146*    Recent Results (from the past 240 hour(s))  Urine culture     Status: None   Collection Time: 08/23/14 11:52 PM  Result Value Ref Range Status   Specimen Description URINE, CLEAN CATCH  Final   Special Requests NONE  Final   Colony Count   Final    >=100,000 COLONIES/ML Performed at [facility]    Culture   Final    ESCHERICHIA COLI Note: Confirmed Extended Spectrum Beta-Lactamase Producer (ESBL) Performed at [facility]    Report Status 08/26/2014 FINAL  Final   Organism ID, Bacteria ESCHERICHIA COLI  Final      Susceptibility   Escherichia coli - MIC*    AMPICILLIN >=32 RESISTANT Resistant     CEFAZOLIN >=64 RESISTANT Resistant     CEFTRIAXONE >=64 RESISTANT Resistant     CIPROFLOXACIN >=4 RESISTANT Resistant     GENTAMICIN <=1 SENSITIVE Sensitive     LEVOFLOXACIN  >=8 RESISTANT Resistant     NITROFURANTOIN <=16 SENSITIVE Sensitive     TOBRAMYCIN <=1 SENSITIVE Sensitive     TRIMETH/SULFA <=20 SENSITIVE Sensitive     IMIPENEM <=0.25 SENSITIVE Sensitive     PIP/TAZO <=4 SENSITIVE Sensitive     * ESCHERICHIA COLI  Culture, blood (x 2)     Status: None (Preliminary result)   Collection Time: 08/24/14  3:50 AM  Result Value Ref Range Status   Specimen Description BLOOD RIGHT FOREARM  Final   Special Requests BOTTLES DRAWN AEROBIC AND ANAEROBIC  Final   Culture   Final           BLOOD CULTURE RECEIVED NO GROWTH TO DATE CULTURE WILL BE HELD FOR 5 DAYS BEFORE ISSUING A FINAL NEGATIVE REPORT Performed at [facility]    Report Status PENDING  Incomplete  Culture, blood (x 2)     Status: None (Preliminary result)   Collection Time: 08/24/14  3:50 AM  Result Value Ref Range Status   Specimen Description BLOOD RIGHT ARM  Final   Special Requests BOTTLES DRAWN AEROBIC AND ANAEROBIC  Final   Culture   Final           BLOOD CULTURE RECEIVED NO GROWTH TO DATE CULTURE WILL BE HELD FOR 5 DAYS BEFORE ISSUING A FINAL NEGATIVE REPORT Performed at [facility]    Report Status PENDING  Incomplete  MRSA PCR Screening     Status: None   Collection Time: 08/24/14  6:41 AM  Result Value Ref Range Status   MRSA by PCR NEGATIVE NEGATIVE Final    Comment:        The GeneXpert MRSA Assay (FDA approved for NASAL specimens only), is one component of a comprehensive MRSA colonization surveillance program. It is not intended to diagnose MRSA infection nor to guide or monitor treatment for MRSA infections.      Studies:              All Imaging reviewed and is as per above notation   Scheduled Meds: . dexamethasone  4 mg Oral Q12H  . feeding supplement (ENSURE ENLIVE)  237 mL Oral TID BM  . heparin  5,000 Units Subcutaneous 3 times per day  . lidocaine  1 patch Transdermal Q24H  . pantoprazole  40 mg Oral Daily  . sodium chloride  3 mL  Intravenous Q12H   Continuous Infusions: .  sodium bicarbonate  infusion 1000 mL 100 mL/hr at 08/26/14 0448     Assessment/Plan  Principal Problem:   Acute kidney injury-2/2 poor by mouth intake versus obstructive uropathy/pyelonephritis. Acute component of his issues have resolved. His potassium is actually quite low now. GFR has improved from 12 on admission to currently 26    COPD (chronic obstructive pulmonary disease) currently stable   Pyelonephritis from obstructive uropathy leading toESBL urine growing in the urine-I discontinued the ceftriaxone and we will treat at this time with Invanz IV. He will continue on contact precautions. He will need a chronic indwelling Foley   Protein-calorie malnutrition, severe-monitor   Hypercalcemia-secondary to malignancy/potentially AK I-now resolved   Hyperkalemia status post Kayexalate X 2 days-now hypokalemic and replace with potassium by mouthe   GERD (gastroesophageal reflux disease)   Systolic congestive heart failure   Pain   Acute pyelonephritis  He has made improvements. His disposition is difficult because he has no specific place to stay other than a care home but from my standpoint he has improved to the point where he may not be eligible for inpatient hospice as he has longer than 2-3 weeks to live in my opinion at this time. I have asked palliative medicine to look in on him and help with disposition in addition to discussion about end-of-life care as he is not decided about DO NOT RESUSCITATE status and is still a full code  Code Status: Currently full Family Communication:  None + Disposition Plan: inpatient pending resolution   Verneita Griffes, MD  [facility] Pager [phone] 08/26/2014, 8:43 PM    LOS: 2 days

## 2014-08-26 NOTE — Progress Notes (Signed)
S: Pain free at present.  We again talked about hospice and he seems agreeable O:BP 118/64 mmHg  Pulse 84  Temp(Src) 98.1 F (36.7 C) (Oral)  Resp 22  Ht '6\' 5"'$  (1.956 m)  Wt 50.5 kg (111 lb 5.3 oz)  BMI 13.20 kg/m2  SpO2 97%  Intake/Output Summary (Last 24 hours) at 08/26/14 1010 Last data filed at 08/26/14 1009  Gross per 24 hour  Intake   1610 ml  Output   3175 ml  Net  -1565 ml   Weight change:  Gen:  Awake and alert CVS:RRR Resp:clear Abd: + BS NTND Ext: no edema NEURO: CNI, Ox3, no asterixis   . cefTRIAXone (ROCEPHIN)  IV  1 g Intravenous Q24H  . dexamethasone  4 mg Oral Q12H  . feeding supplement (ENSURE ENLIVE)  237 mL Oral TID BM  . heparin  5,000 Units Subcutaneous 3 times per day  . lidocaine  1 patch Transdermal Q24H  . pantoprazole  40 mg Oral Daily  . sodium chloride  3 mL Intravenous Q12H   No results found. BMET    Component Value Date/Time   NA 142 08/25/2014 0920   K 2.8* 08/25/2014 0920   CL 99* 08/25/2014 0920   CO2 29 08/25/2014 0920   GLUCOSE 141* 08/25/2014 0920   BUN 90* 08/25/2014 0920   CREATININE 3.29* 08/25/2014 0920   CALCIUM 9.2 08/25/2014 0920   GFRNONAA 17* 08/25/2014 0920   GFRAA 20* 08/25/2014 0920   CBC    Component Value Date/Time   WBC 17.5* 08/24/2014 0350   RBC 4.52 08/24/2014 0350   HGB 9.5* 08/24/2014 0350   HCT 31.4* 08/24/2014 0350   PLT 308 08/24/2014 0350   MCV 69.5* 08/24/2014 0350   MCH 21.0* 08/24/2014 0350   MCHC 30.3 08/24/2014 0350   RDW 16.9* 08/24/2014 0350   LYMPHSABS 1.6 08/23/2014 2345   MONOABS 0.6 08/23/2014 2345   EOSABS 0.0 08/23/2014 2345   BASOSABS 0.0 08/23/2014 2345     Assessment:  1. Acute on CKD 4, Scr trending down  2. Hypokalemia 3. Hypercalcemia, improved 4. E Coli UTI, on ceftriaxone  Plan: 1.  Renal fx improving as of yest.  No labs today 2.  He is not a dialysis candidate.  Will sign off.  Let me know if I can be of further assistance.   Javanna Patin T

## 2014-08-26 NOTE — Progress Notes (Signed)
PT Cancellation Note  Patient Details Name: [name] MRN: [id number] DOB: 1940-12-25   Cancelled Treatment:    Reason Eval/Treat Not Completed: Patient declined, no reason specified Have attempted twice today (before lunch at 11:50 "it is almost time for lunch and it has been non-stop people in here") and again this afternoon (Patient reports "i just got some bad news from the doctors.") Will try again next 1-2 days as able.  [place], PT [phone]  [place] 08/26/2014, 3:09 PM

## 2014-08-27 DIAGNOSIS — A419 Sepsis, unspecified organism: Secondary | ICD-10-CM

## 2014-08-27 DIAGNOSIS — R6520 Severe sepsis without septic shock: Secondary | ICD-10-CM

## 2014-08-27 DIAGNOSIS — J430 Unilateral pulmonary emphysema [MacLeod's syndrome]: Secondary | ICD-10-CM

## 2014-08-27 LAB — CBC
HCT: 27.2 % — ABNORMAL LOW (ref 39.0–52.0)
Hemoglobin: 8.2 g/dL — ABNORMAL LOW (ref 13.0–17.0)
MCH: 20.3 pg — ABNORMAL LOW (ref 26.0–34.0)
MCHC: 30.1 g/dL (ref 30.0–36.0)
MCV: 67.3 fL — AB (ref 78.0–100.0)
PLATELETS: 276 10*3/uL (ref 150–400)
RBC: 4.04 MIL/uL — AB (ref 4.22–5.81)
RDW: 16.7 % — ABNORMAL HIGH (ref 11.5–15.5)
WBC: 13.5 10*3/uL — ABNORMAL HIGH (ref 4.0–10.5)

## 2014-08-27 LAB — BASIC METABOLIC PANEL
Anion gap: 12 (ref 5–15)
BUN: 45 mg/dL — ABNORMAL HIGH (ref 6–20)
CHLORIDE: 90 mmol/L — AB (ref 101–111)
CO2: 35 mmol/L — AB (ref 22–32)
Calcium: 8.8 mg/dL — ABNORMAL LOW (ref 8.9–10.3)
Creatinine, Ser: 2.33 mg/dL — ABNORMAL HIGH (ref 0.61–1.24)
GFR calc Af Amer: 30 mL/min — ABNORMAL LOW (ref >60)
GFR, EST NON AFRICAN AMERICAN: 26 mL/min — AB (ref >60)
GLUCOSE: 137 mg/dL — AB (ref 65–99)
POTASSIUM: 3.2 mmol/L — AB (ref 3.5–5.1)
SODIUM: 137 mmol/L (ref 135–145)

## 2014-08-27 LAB — PHOSPHORUS: Phosphorus: 2.4 mg/dL — ABNORMAL LOW (ref 2.5–4.6)

## 2014-08-27 LAB — GLUCOSE, CAPILLARY: GLUCOSE-CAPILLARY: 136 mg/dL — AB (ref 65–99)

## 2014-08-27 MED ORDER — TROLAMINE SALICYLATE 10 % EX CREA
TOPICAL_CREAM | Freq: Two times a day (BID) | CUTANEOUS | Status: DC | PRN
  Filled 2014-08-27: qty 85

## 2014-08-27 MED ORDER — MUSCLE RUB 10-15 % EX CREA
TOPICAL_CREAM | Freq: Two times a day (BID) | CUTANEOUS | Status: DC | PRN
  Administered 2014-08-27: 13:00:00 via TOPICAL
  Filled 2014-08-27: qty 85

## 2014-08-27 NOTE — Consult Note (Addendum)
[place] Liaison: Received call from [place] for followup with Mr. Moomaw today re interest in [facility]. Attempted vist x2 before he was awake enough to engage. During third visit his landlord and a house mate joined visit. Mr. Kagel was most grateful for aspercreme which recently brought to room and his landlord applied. He later reported it had been thirty minutes since he experienced pain. He described aspercreme as his "old fashioned remedy" for pain. At the beginning of this visit he was in tears and gripping the side rails per his report due to hip pain. By the end of this visit he was moving around comfortably as he said "getting around in my own way." His landlord was very helpful filling in missing pieces of his situation. We discussed hospice options from home with hospice to SNF for rehab with [facility] to SNF with hospice when he is no longer pursuing therapy to [facility] when ready. Landlord reports his Medicaid is pending and she is meeting with DSS tomorrow morning to complete process. At the end of this visit patient and caregiver decided SNF for attempt at therapy is their choice at this time. They have good understanding of [facility]. They are aware some folks go to [facility] and leave to pursue other options. They are also aware you can get to [facility] from SNF. They know they are welcome to call if they change their mind. Encouraged Mr. Mondo to consider designating a HCPOA. He acknowledged that his landlord is who he would designate if he was going to complete process. He said he felt he had done enough work for today and would consider doing that tomorrow. Confirmed landlord's name and number Forestine Na [phone]. Spoke with Dr. Verlon Au, [place] LCSW, and Dr. Deitra Mayo following this visit. Encouraged all to call if plan changes or if [facility] can help in any other way. Thank you.   Erling Conte, [place]

## 2014-08-27 NOTE — Clinical Social Work Note (Signed)
Clinical Social Work Assessment  Patient Details  Name: [name] MRN: [id number] Date of Birth: 1940-10-31  Date of referral:  08/26/14               Reason for consult:  Facility Placement, Housing Concerns/Homelessness                Permission sought to share information with:  Customer service manager (Requested to speak to [place]) Permission granted to share information::  Yes, Verbal Permission Granted  Name::        Agency::     Relationship::     Contact Information:     Housing/Transportation Living arrangements for the past 2 months:  International Paper of Information:  Patient Patient Interpreter Needed:  None Criminal Activity/Legal Involvement Pertinent to Current Situation/Hospitalization:  No - Comment as needed Significant Relationships:  None Lives with:  Self Do you feel safe going back to the place where you live?  Yes Need for family participation in patient care:  No (Coment)  Care giving concerns:  Patient recently received information about having metastatic cancer. Patient states that he is understanding that he is declining and would like Residential Hospice.    Social Worker assessment / plan: CSW connected with Residential Hospice coordinator who identified that patient has exhausted his snf days and prognosis does not support Residential Hospice. There may be a need to explore care plan further.   Employment status:  Disabled (Comment on whether or not currently receiving Disability) Insurance information:  Medicare PT Recommendations:  [place] / Referral to community resources:   (Need follow up assessment)  Patient/Family's Response to care: Patient in agreement and requesting to speak to hospice CSW.  Patient/Family's Understanding of and Emotional Response to Diagnosis, Current Treatment, and Prognosis:  Patient adjusting to prognoses but not identifying that this adjustment is difficult for him.    Emotional Assessment Appearance:  Appears stated age Attitude/Demeanor/Rapport:   (Patient open to discussing the progress of treatment up to this point with CSW) Affect (typically observed):  Guarded, Appropriate Orientation:  Oriented to Self, Oriented to Place, Oriented to  Time, Oriented to Situation Alcohol / Substance use:  Not Applicable Psych involvement (Current and /or in the community):  No (Comment)  Discharge Needs  Concerns to be addressed:  Financial / Insurance Concerns (Patient current prognosis may not qualify for Residential Hospice (which is his desire). Patient may be out of medicare days for SNF with hospice.) Readmission within the last 30 days:  No Current discharge risk:  Other Barriers to Discharge:  Inadequate or no insurance   Christene Lye, LCSW 08/27/2014, 1:13 PM

## 2014-08-27 NOTE — Progress Notes (Signed)
Spoke today with [employer]. I introduced myself as a member of the PMT who works with Kizzie Fantasia.  He stated to me that he knows "what's going on and I am not talking about any of that today".  When I ask him to clarify he tells me that he does not want to talk about what is going on with his health and that he is tired of these conversations.  He has some pain still in his right leg and wants aspercreme.  I have gone ahead and ordered that for him. I have touched base with Joslyn Devon and currently looking into residential hospice options for him. Hopefully will have some more information on this front tomorrow.  He is obviously frustrated and having a hard time coping with terminal nature of his disease as has been documented by both Kizzie Fantasia PMT RN and Dr Verlon Au.  I don't think forcing conversation on him that he warns me he doesn't want to have will be fruitful or helpful. He put this barrier up in the first 60 seconds of talking to him and I think the only question I asked was related to how he felt today.  I appreciate the difficult nature of this case and the lack of congruity between hopes for hospice home, active treatments, code status, etc as has been discussed and documnted.  I think we have to try and meet him where he is at and work on these things as he allows.   Doran Clay D.O. Palliative Medicine Team at [facility]  Pager: [phone] Team Phone: [phone]

## 2014-08-27 NOTE — Progress Notes (Signed)
Utilization Review completed. Deidrea Gaetz RN BSN CM 

## 2014-08-27 NOTE — Progress Notes (Signed)
[name] OIB:[id number] DOB: March 29, 1940 DOA: 08/23/2014 PCP: No primary care provider on file.  Brief narrative:  74 y/o ? 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 Benay Spice at [facility] He decided to forego XRT and has vacillated between fll comfort with Hopsice and "I want everything done" He tells me he spoke with Dr. Posey Pronto and was told that "they cannot give me dialysis" I discussed clearly with the patient that he has a metastatic Ca that would not make him a candidate for dialysis. Palliative medicine saw the patient as did Hospice He will need SNF care on d/c with Pallaitive following and then potentital transfer to Hospice if declines   Past medical history-As per Problem list Chart reviewed as below- reviewed  Consultants:  Renal  palliative  Procedures:  none  Antibiotics:  none   Subjective   Emotional No n/v/cp tol diet Finally got aspercream No n/v Some liquid stool-not unusual No fever nor chills   Objective    Interim History:   Telemetry:    Objective: Filed Vitals:   08/26/14 1328 08/26/14 2250 08/27/14 0544 08/27/14 1315  BP: 125/72 119/70 123/58 128/57  Pulse: 81 90 80 86  Temp: 98.6 F (37 C) 98.5 F (36.9 C) 98.7 F (37.1 C) 97.9 F (36.6 C)  TempSrc:  Oral Oral Oral  Resp: _0 Height:      Weight:      SpO2: 99% 100% 97% 98%    Intake/Output Summary (Last 24 hours) at 08/27/14 1447 Last data filed at 08/27/14 1318  Gross per 24 hour  Intake    988 ml  Output   2550 ml  Net  -1562 ml    Exam:  General: eomi, frail, no pallor no ict, bitemporal wasting Cardiovascular: s1 s 2no nm/r/g Respiratory: clear no added sound Abdomen: soft, NT, ND Neuro  intact-pain limits R hip movement  Data Reviewed: Basic Metabolic Panel:  Recent Labs Lab 08/23/14 2345 08/24/14 0350 08/25/14 0920 08/26/14 1150 08/27/14 0640  NA 135 138 142 138 137  K 6.1* 5.5* 2.8* 2.9* 3.2*  CL 102 106 99* 90* 90*  CO2 13* 14* 29 34* 35*  GLUCOSE 54* 52* 141* 132* 137*  BUN 131* 134* 90* 60* 45*  CREATININE 5.06* 4.75* 3.29* 2.65* 2.33*  CALCIUM 11.7* 11.0* 9.2 8.9 8.8*  MG  --  2.8*  --   --   --   PHOS  --  7.8*  --   --  2.4*   Liver Function Tests:  Recent Labs Lab 08/23/14 2345  AST 20  ALT 15*  ALKPHOS 77  BILITOT 1.0  PROT 8.0  ALBUMIN 2.9*   No results for input(s): LIPASE, AMYLASE in the last 168 hours. No results for input(s): AMMONIA in the last 168 hours. CBC:  Recent Labs Lab 08/23/14 2345 08/24/14 0350 08/27/14 0640  WBC 19.5* 17.5* 13.5*  NEUTROABS 17.3*  --   --   HGB 10.4* 9.5* 8.2*  HCT 34.5* 31.4* 27.2*  MCV 70.3* 69.5* 67.3*  PLT 348 308 276   Cardiac Enzymes: No results for input(s): CKTOTAL, CKMB, CKMBINDEX, TROPONINI in the last 168 hours. BNP: Invalid input(s): POCBNP CBG:  Recent Labs Lab 08/24/14  1015 08/24/14 1129 08/25/14 0751 08/26/14 0757 08/27/14 0754  GLUCAP 83 98 122* 146* 136*    Recent Results (from the past 240 hour(s))  Urine culture     Status: None   Collection Time: 08/23/14 11:52 PM  Result Value Ref Range Status   Specimen Description URINE, CLEAN CATCH  Final   Special Requests NONE  Final   Colony Count   Final    >=100,000 COLONIES/ML Performed at [facility]    Culture   Final    ESCHERICHIA COLI Note: Confirmed Extended Spectrum Beta-Lactamase Producer (ESBL) Performed at [facility]    Report Status 08/26/2014 FINAL  Final   Organism ID, Bacteria ESCHERICHIA COLI  Final      Susceptibility   Escherichia coli - MIC*    AMPICILLIN >=32 RESISTANT Resistant     CEFAZOLIN >=64 RESISTANT Resistant     CEFTRIAXONE >=64 RESISTANT Resistant      CIPROFLOXACIN >=4 RESISTANT Resistant     GENTAMICIN <=1 SENSITIVE Sensitive     LEVOFLOXACIN >=8 RESISTANT Resistant     NITROFURANTOIN <=16 SENSITIVE Sensitive     TOBRAMYCIN <=1 SENSITIVE Sensitive     TRIMETH/SULFA <=20 SENSITIVE Sensitive     IMIPENEM <=0.25 SENSITIVE Sensitive     PIP/TAZO <=4 SENSITIVE Sensitive     * ESCHERICHIA COLI  Culture, blood (x 2)     Status: None (Preliminary result)   Collection Time: 08/24/14  3:50 AM  Result Value Ref Range Status   Specimen Description BLOOD RIGHT FOREARM  Final   Special Requests BOTTLES DRAWN AEROBIC AND ANAEROBIC  Final   Culture   Final           BLOOD CULTURE RECEIVED NO GROWTH TO DATE CULTURE WILL BE HELD FOR 5 DAYS BEFORE ISSUING A FINAL NEGATIVE REPORT Performed at [facility]    Report Status PENDING  Incomplete  Culture, blood (x 2)     Status: None (Preliminary result)   Collection Time: 08/24/14  3:50 AM  Result Value Ref Range Status   Specimen Description BLOOD RIGHT ARM  Final   Special Requests BOTTLES DRAWN AEROBIC AND ANAEROBIC  Final   Culture   Final           BLOOD CULTURE RECEIVED NO GROWTH TO DATE CULTURE WILL BE HELD FOR 5 DAYS BEFORE ISSUING A FINAL NEGATIVE REPORT Performed at [facility]    Report Status PENDING  Incomplete  MRSA PCR Screening     Status: None   Collection Time: 08/24/14  6:41 AM  Result Value Ref Range Status   MRSA by PCR NEGATIVE NEGATIVE Final    Comment:        The GeneXpert MRSA Assay (FDA approved for NASAL specimens only), is one component of a comprehensive MRSA colonization surveillance program. It is not intended to diagnose MRSA infection nor to guide or monitor treatment for MRSA infections.      Studies:              All Imaging reviewed and is as per above notation   Scheduled Meds: . dexamethasone  4 mg Oral Q12H  . ertapenem  0.5 g Intravenous Q24H  . feeding supplement (ENSURE ENLIVE)  237 mL Oral TID BM  . heparin  5,000 Units  Subcutaneous 3 times per day  . lidocaine  1 patch Transdermal Q24H  . pantoprazole  40 mg Oral Daily  . sodium chloride  3 mL Intravenous Q12H  Continuous Infusions: .  sodium bicarbonate  infusion 1000 mL 100 mL/hr at 08/27/14 2694     Assessment/Plan  Principal Problem:   Acute kidney injury-2/2 poor by mouth intake versus obstructive uropathy/pyelonephritis. Acute component of his issues have resolved. His potassium is actually quite low now. GFR has improved from 12 on admission to currently 26    COPD (chronic obstructive pulmonary disease) currently stable   Pyelonephritis from obstructive uropathy leading toESBL urine growing in the urine-I discontinued the ceftriaxone and we will treat at this time with Invanz IV-->Macrobid to complete a total of 3 more days therapy FOley suprapubic needs to be changed q 2 weekly   Protein-calorie malnutrition, severe-monitor   Hypercalcemia-secondary to malignancy/potentially AK I-now resolved   Hyperkalemia status post Kayexalate X 2 days-now hypokalemic and replace with potassium by mouth Labs am   GERD (gastroesophageal reflux disease)   Systolic congestive heart failure   Pain   Acute pyelonephritis  He has made improvements. Plan is for SNF with Palliative to follow Code Status: Currently full Family Communication:  None + Disposition Plan: inpatient pending resolution   Verneita Griffes, MD  [facility] Pager [phone] 08/27/2014, 2:47 PM    LOS: 3 days

## 2014-08-27 NOTE — Consult Note (Signed)
WOC wound consult note Reason for Consult: left hip wound Wound type: sDTI (suspected deep tissue injury) Pressure Ulcer POA: Yes Measurement: 5cm x 5cm x 0.1cm  Wound bed:50% pink/white, 50% dark, maroon tissue Drainage (amount, consistency, odor) minimal  Periwound:intact Dressing procedure/placement/frequency: Silicone foam has been implemented and will serve to protect and insulate this area. Pt favors his left side, and it appears he turns very little.  He is very thin and has quite a bit of pain which also limits his mobility.  Will add air mattress for this reason  [place] team will follow along with you for weekly wound assessments.  Please notify me of any acute changes in the wounds or any new areas of concerns Para March RN,CWOCN [phone]

## 2014-08-28 LAB — RENAL FUNCTION PANEL
Albumin: 1.9 g/dL — ABNORMAL LOW (ref 3.5–5.0)
Anion gap: 12 (ref 5–15)
BUN: 32 mg/dL — ABNORMAL HIGH (ref 6–20)
CO2: 37 mmol/L — AB (ref 22–32)
CREATININE: 2.25 mg/dL — AB (ref 0.61–1.24)
Calcium: 8.5 mg/dL — ABNORMAL LOW (ref 8.9–10.3)
Chloride: 88 mmol/L — ABNORMAL LOW (ref 101–111)
GFR, EST AFRICAN AMERICAN: 31 mL/min — AB (ref >60)
GFR, EST NON AFRICAN AMERICAN: 27 mL/min — AB (ref >60)
Glucose, Bld: 122 mg/dL — ABNORMAL HIGH (ref 65–99)
PHOSPHORUS: 2.1 mg/dL — AB (ref 2.5–4.6)
Potassium: 3 mmol/L — ABNORMAL LOW (ref 3.5–5.1)
SODIUM: 137 mmol/L (ref 135–145)

## 2014-08-28 LAB — CBC WITH DIFFERENTIAL/PLATELET
Basophils Absolute: 0 10*3/uL (ref 0.0–0.1)
Basophils Relative: 0 % (ref 0–1)
EOS PCT: 0 % (ref 0–5)
Eosinophils Absolute: 0 10*3/uL (ref 0.0–0.7)
HCT: 29.1 % — ABNORMAL LOW (ref 39.0–52.0)
Hemoglobin: 8.9 g/dL — ABNORMAL LOW (ref 13.0–17.0)
LYMPHS ABS: 1.2 10*3/uL (ref 0.7–4.0)
Lymphocytes Relative: 7 % — ABNORMAL LOW (ref 12–46)
MCH: 20.8 pg — ABNORMAL LOW (ref 26.0–34.0)
MCHC: 30.6 g/dL (ref 30.0–36.0)
MCV: 68.1 fL — AB (ref 78.0–100.0)
MONOS PCT: 6 % (ref 3–12)
Monocytes Absolute: 1 10*3/uL (ref 0.1–1.0)
NEUTROS PCT: 87 % — AB (ref 43–77)
Neutro Abs: 14.5 10*3/uL — ABNORMAL HIGH (ref 1.7–7.7)
PLATELETS: 223 10*3/uL (ref 150–400)
RBC: 4.27 MIL/uL (ref 4.22–5.81)
RDW: 16.8 % — ABNORMAL HIGH (ref 11.5–15.5)
WBC: 16.7 10*3/uL — ABNORMAL HIGH (ref 4.0–10.5)

## 2014-08-28 LAB — GLUCOSE, CAPILLARY: Glucose-Capillary: 113 mg/dL — ABNORMAL HIGH (ref 65–99)

## 2014-08-28 MED ORDER — POTASSIUM CHLORIDE ER 20 MEQ PO TBCR: 40.0000 meq | EXTENDED_RELEASE_TABLET | Freq: Every day | ORAL | Status: AC

## 2014-08-28 MED ORDER — HYDROMORPHONE HCL 2 MG PO TABS: 1.0000 mg | ORAL_TABLET | ORAL | Status: AC | PRN

## 2014-08-28 MED ORDER — NITROFURANTOIN MONOHYD MACRO 100 MG PO CAPS: 100.0000 mg | ORAL_CAPSULE | Freq: Two times a day (BID) | ORAL | Status: DC

## 2014-08-28 MED ORDER — SULFAMETHOXAZOLE-TRIMETHOPRIM 800-160 MG PO TABS: 1.0000 | ORAL_TABLET | Freq: Two times a day (BID) | ORAL | Status: AC

## 2014-08-28 NOTE — Progress Notes (Signed)
Physical Therapy Treatment Patient Details Name: [name] MRN: [id number] DOB: 09/11/1940 Today's Date: 08/28/2014    History of Present Illness Pt admitted with weakness, decreased appetite and nausea. Pt was released from SNF on 07/19/14. 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 Pt with UTI and chronic suprapubic catheter.  Having profuse diarrhea.    PT Comments    Pt was able to get up to bedside with limited exertion due to weakness and RLE pain which is ongoing.  He is planning to go to facility today and expecting to be transported there shortly.  Will expect he will work on transfers and bed mob there to ease his care and help him to control discomfort of being moved with staff.  Follow Up Recommendations  SNF     Equipment Recommendations  None recommended by PT    Recommendations for Other Services       Precautions / Restrictions Precautions Precautions: Fall Precaution Comments: catheter, IV Restrictions Weight Bearing Restrictions: No    Mobility  Bed Mobility Overal bed mobility: Needs Assistance Bed Mobility: Sidelying to Sit;Sit to Supine Rolling: Min assist Sidelying to sit: Min assist;Mod assist   Sit to supine: Min assist   General bed mobility comments: Pt mainly needs help with legs to sit bedside and back, limited session due to pain and impending discharge to ALF  Transfers                 General transfer comment: unable  Ambulation/Gait                 Stairs            Wheelchair Mobility    Modified Rankin (Stroke Patients Only)       Balance           Standing balance support: Bilateral upper extremity supported Standing balance-Leahy Scale: Poor                      Cognition Arousal/Alertness: Awake/alert Behavior During Therapy: Anxious Overall  Cognitive Status: Within Functional Limits for tasks assessed                      Exercises      General Comments General comments (skin integrity, edema, etc.): Pt is fragile and thin, but is trying to move himself more today.  He needs positioning assistance with bedside care, and will not be able to stand with PT due to pain RLE      Pertinent Vitals/Pain Pain Assessment: Faces Pain Score: 4  Faces Pain Scale: Hurts little more Pain Location: R hip Pain Intervention(s): Limited activity within patient's tolerance;Monitored during session;Repositioned    Home Living                      Prior Function            PT Goals (current goals can now be found in the care plan section) Acute Rehab PT Goals Patient Stated Goal: Go to his next care level Progress towards PT goals: Progressing toward goals    Frequency  Min 2X/week    PT Plan Current plan remains appropriate    Co-evaluation             End of Session  Activity Tolerance: Patient limited by fatigue Patient left: in bed;with call bell/phone within reach     Time: 1215-1236 PT Time Calculation (min) (ACUTE ONLY): 21 min  Charges:  $Therapeutic Activity: 8-22 mins                    G Codes:      [name] 08/31/2014, 1:17 PM   [name], PT MS Acute Rehab Dept. Number: [facility] [phone] and [place] [phone]

## 2014-08-28 NOTE — Discharge Summary (Addendum)
Physician Discharge Summary  [name] NAT:[id number] DOB: 75/06/1940 DOA: 08/23/2014  PCP: No primary care provider on file.  Admit date: 08/23/2014 Discharge date: 08/28/2014  Time spent: 35 minutes  Recommendations for Outpatient Follow-up:   1. Patient to be d/c to SNF with Palliative following 2.  to meds as below  Dilaudid added to meds-morphine d/c as not well cleare dwiht renal insuff  Bactrim DS for ESBL to complete 6/15  Patient has preference for Aspercreme-please Rx this to him prn to hip 3. Change foley q 2 weekly as frequent pyelo 4. 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 Benay Spice at [facility] He decided to forego XRT and has vacillated between fll comfort with Hopsice and "I want everything done" He tells me he spoke with Dr. Posey Pronto and was told that "they cannot give me dialysis" I discussed  clearly with the patient that he has a metastatic Ca that would not make him a candidate for dialysis. Palliative medicine saw the patient as did Hospice He will need SNF care on d/c with Pallaitive following and then potentital transfer to Hospice if declines  Hospital Course:     Acute kidney injury-2/2 poor by mouth intake versus obstructive uropathy/pyelonephritis. Acute component of his issues have resolved. His potassium is actually quite low now. GFR has improved from 12 on admission to currently 27 on day of d/c    COPD (chronic obstructive pulmonary disease) currently stable    Pyelonephritis from obstructive uropathy leading to ESBL urine growing in the urine-I discontinued the ceftriaxone and we will treat at this time with Invanz IV-->bactrim DS to complete a total of 3 more days therapy FOley suprapubic needs to be changed q 2 weekly    Protein-calorie malnutrition, severe   Hypercalcemia-secondary to malignancy/potentially AK I-now resolved    Hyperkalemia status post Kayexalate X 2 days-now hypokalemic and replace with potassium by mouth as an OP with 40 Meq daily     GERD (gastroesophageal reflux disease)   Systolic congestive heart failure    Pain-keep on apsercreme and dilaudid    Acute pyelonephritis  POOR OVERALL LIKELY ULTIMATE PROGNOSIS-PALLIATIVE TO FOLLOW AS OP  Consultants:  Renal  palliative  Procedures:  none  Antibiotics:  none    Discharge Exam: Filed Vitals:   08/28/14 0630  BP: 129/66  Pulse: 95  Temp: 98.6 F (37  C)  Resp: 17    General: eomi, ncat Cardiovascular: s1 s2 no m/r/g Respiratory: clear no added sound  Discharge Instructions   Discharge Instructions    Diet - low sodium heart healthy    Complete by:  As directed      Increase activity slowly    Complete by:  As directed           Current Discharge Medication List    START taking these medications   Details  HYDROmorphone (DILAUDID) 2 MG tablet Take 0.5  tablets (1 mg total) by mouth every 2 (two) hours as needed for severe pain. Qty: 30 tablet, Refills: 0    potassium chloride 20 MEQ TBCR Take 40 mEq by mouth daily. Qty: 30 tablet, Refills: 0    sulfamethoxazole-trimethoprim (BACTRIM DS,SEPTRA DS) 800-160 MG per tablet Take 1 tablet by mouth 2 (two) times daily. Qty: 4 tablet      CONTINUE these medications which have NOT CHANGED   Details  dexamethasone (DECADRON) 4 MG tablet Take 1 tablet (4 mg total) by mouth every 12 (twelve) hours.   Associated Diagnoses: Pain; Cancer    LORazepam (ATIVAN) 0.5 MG tablet Take 0.5 mg by mouth every 6 (six) hours as needed for anxiety. for anxiety Refills: 0    omeprazole (PRILOSEC) 20 MG capsule Take 20 mg by mouth daily.    sodium bicarbonate 650 MG tablet Take 1 tablet by mouth 2 (two) times daily. Refills: 0    trolamine salicylate (ASPERCREME) 10 % cream Apply 1 application topically as needed for muscle pain.    ferrous sulfate 325 (65 FE) MG tablet Take 1 tablet (325 mg total) by mouth daily with breakfast. Qty: 90 tablet, Refills: 3    LORazepam (ATIVAN) 2 MG/ML concentrated solution Take 0.5 mLs (1 mg total) by mouth every 6 (six) hours as needed for anxiety. Qty: 30 mL, Refills: 0    Nutritional Supplements (FEEDING SUPPLEMENT, NEPRO CARB STEADY,) LIQD Take 237 mLs by mouth 3 (three) times daily between meals. Refills: 0      STOP taking these medications     Morphine Sulfate (MORPHINE CONCENTRATE) 10 MG/0.5ML SOLN concentrated solution        No Known Allergies    The results of significant diagnostics from this hospitalization (including imaging, microbiology, ancillary and laboratory) are listed below for reference.    Significant Diagnostic Studies: Dg Chest 2 View  08/24/2014   CLINICAL DATA:  Altered mental status, gluteal ulcer. History of metastatic cancer, COPD.  EXAM: CHEST  2 VIEW  COMPARISON:  CT chest July 14, 2014  FINDINGS: Cardiomediastinal silhouette is  nonsuspicious. Increased lung volumes consistent with history of COPD without pleural effusion. Presumed nipple shadow RIGHT lung base. Calcified granuloma. No pneumothorax. Suspected lytic lesion RIGHT humerus versus artifact.  IMPRESSION: COPD without acute cardiopulmonary process.   Electronically Signed   By: Elon Alas M.D.   On: 08/24/2014 00:20   Ct Renal Stone Study  08/24/2014   CLINICAL DATA:  Abdominal pain and leukocytosis  EXAM: CT ABDOMEN AND PELVIS WITHOUT CONTRAST  TECHNIQUE: Multidetector CT imaging of the abdomen and pelvis was performed following the standard protocol without IV contrast.  COMPARISON:  07/14/2014  FINDINGS: There is no evidence of bowel obstruction. No acute inflammatory changes are evident. There is no abscess, ascites or drainable fluid collection.  There is moderate atrophy of both kidneys. There is chronic hydronephrosis and hydroureter bilaterally. There is mural thickening of the ureters and renal collecting  systems consistent with chronic pyelonephritis.  There is marked irregularity of the urinary bladder with mass like nodularity and mural thickening. There is a suprapubic urinary bladder catheter.  There are grossly unremarkable unenhanced appearances of the liver and bile ducts. There is a 8 mm calculus within the gallbladder lumen. There are unremarkable unenhanced appearances of the spleen, pancreas and adrenals.  There is progression of skeletal metastatic disease. The right iliac lesion measures 6.5 x 10.6 by 11.0 cm and previously measured 4.8 x 7.1 by 7.4 cm. The right L5 transverse process mass measures 2.2 x 3.2 x 3.4 cm and previously measured 1.5 x 2.4 x 2.6 cm. There is a new skeletal metastasis involving the inferior left pubic ramus. The right pubic rami CT metastasis is only slightly larger, measuring 3.8 x 4.4 cm and previously measuring 3.4 x 4.3 cm.  There is no significant abnormality in the lower chest.  IMPRESSION: Bilateral renal atrophy  with chronic hydronephrosis and hydroureter. Moderate mural thickening of the ureters and collecting systems, consistent with chronic pyelonephritis. The urinary bladder is markedly irregular, perhaps involved with multiple masses. This is not significantly changed from 07/14/2014. Unremarkable suprapubic urinary bladder catheter.  No acute inflammatory changes are evident in the abdomen or pelvis.  Negative for abscess, extraluminal air, or bowel obstruction.  Cholelithiasis  Progression of skeletal metastatic disease.   Electronically Signed   By: Andreas Newport M.D.   On: 08/24/2014 05:14   Dg Femur, Min 2 Views Right  08/24/2014   CLINICAL DATA:  Right hip and leg pain. Vague history of recent fall.  EXAM: RIGHT FEMUR 2 VIEWS  COMPARISON:  None.  FINDINGS: There is no evidence of fracture or other focal bone lesions. Soft tissues are unremarkable.  IMPRESSION: Negative.   Electronically Signed   By: Andreas Newport M.D.   On: 08/24/2014 00:21    Microbiology: Recent Results (from the past 240 hour(s))  Urine culture     Status: None   Collection Time: 08/23/14 11:52 PM  Result Value Ref Range Status   Specimen Description URINE, CLEAN CATCH  Final   Special Requests NONE  Final   Colony Count   Final    >=100,000 COLONIES/ML Performed at [facility]    Culture   Final    ESCHERICHIA COLI Note: Confirmed Extended Spectrum Beta-Lactamase Producer (ESBL) Performed at [facility]    Report Status 08/26/2014 FINAL  Final   Organism ID, Bacteria ESCHERICHIA COLI  Final      Susceptibility   Escherichia coli - MIC*    AMPICILLIN >=32 RESISTANT Resistant     CEFAZOLIN >=64 RESISTANT Resistant     CEFTRIAXONE >=64 RESISTANT Resistant     CIPROFLOXACIN >=4 RESISTANT Resistant     GENTAMICIN <=1 SENSITIVE Sensitive     LEVOFLOXACIN >=8 RESISTANT Resistant     NITROFURANTOIN <=16 SENSITIVE Sensitive     TOBRAMYCIN <=1 SENSITIVE Sensitive     TRIMETH/SULFA <=20  SENSITIVE Sensitive     IMIPENEM <=0.25 SENSITIVE Sensitive     PIP/TAZO <=4 SENSITIVE Sensitive     * ESCHERICHIA COLI  Culture, blood (x 2)     Status: None (Preliminary result)   Collection Time: 08/24/14  3:50 AM  Result Value Ref Range Status   Specimen Description BLOOD RIGHT FOREARM  Final   Special Requests BOTTLES DRAWN AEROBIC AND ANAEROBIC  Final   Culture   Final           BLOOD CULTURE RECEIVED  NO GROWTH TO DATE CULTURE WILL BE HELD FOR 5 DAYS BEFORE ISSUING A FINAL NEGATIVE REPORT Performed at [facility]    Report Status PENDING  Incomplete  Culture, blood (x 2)     Status: None (Preliminary result)   Collection Time: 08/24/14  3:50 AM  Result Value Ref Range Status   Specimen Description BLOOD RIGHT ARM  Final   Special Requests BOTTLES DRAWN AEROBIC AND ANAEROBIC  Final   Culture   Final           BLOOD CULTURE RECEIVED NO GROWTH TO DATE CULTURE WILL BE HELD FOR 5 DAYS BEFORE ISSUING A FINAL NEGATIVE REPORT Performed at [facility]    Report Status PENDING  Incomplete  MRSA PCR Screening     Status: None   Collection Time: 08/24/14  6:41 AM  Result Value Ref Range Status   MRSA by PCR NEGATIVE NEGATIVE Final    Comment:        The GeneXpert MRSA Assay (FDA approved for NASAL specimens only), is one component of a comprehensive MRSA colonization surveillance program. It is not intended to diagnose MRSA infection nor to guide or monitor treatment for MRSA infections.      Labs: Basic Metabolic Panel:  Recent Labs Lab 08/24/14 0350 08/25/14 0920 08/26/14 1150 08/27/14 0640 08/28/14 0715  NA 138 142 138 137 137  K 5.5* 2.8* 2.9* 3.2* 3.0*  CL 106 99* 90* 90* 88*  CO2 14* 29 34* 35* 37*  GLUCOSE 52* 141* 132* 137* 122*  BUN 134* 90* 60* 45* 32*  CREATININE 4.75* 3.29* 2.65* 2.33* 2.25*  CALCIUM 11.0* 9.2 8.9 8.8* 8.5*  MG 2.8*  --   --   --   --   PHOS 7.8*  --   --  2.4* 2.1*   Liver Function Tests:  Recent Labs Lab  08/23/14 2345 08/28/14 0715  AST 20  --   ALT 15*  --   ALKPHOS 77  --   BILITOT 1.0  --   PROT 8.0  --   ALBUMIN 2.9* 1.9*   No results for input(s): LIPASE, AMYLASE in the last 168 hours. No results for input(s): AMMONIA in the last 168 hours. CBC:  Recent Labs Lab 08/23/14 2345 08/24/14 0350 08/27/14 0640 08/28/14 0715  WBC 19.5* 17.5* 13.5* 16.7*  NEUTROABS 17.3*  --   --  14.5*  HGB 10.4* 9.5* 8.2* 8.9*  HCT 34.5* 31.4* 27.2* 29.1*  MCV 70.3* 69.5* 67.3* 68.1*  PLT 348 308 276 223   Cardiac Enzymes: No results for input(s): CKTOTAL, CKMB, CKMBINDEX, TROPONINI in the last 168 hours. BNP: BNP (last 3 results) No results for input(s): BNP in the last 8760 hours.  ProBNP (last 3 results)  Recent Labs  10/06/13 2143  PROBNP 1216.0*    CBG:  Recent Labs Lab 08/24/14 1129 08/25/14 0751 08/26/14 0757 08/27/14 0754 08/28/14 0822  GLUCAP 98 122* 146* 136* 113*       Signed:  Nita Sells  [facility] 08/28/2014, 10:42 AM

## 2014-08-28 NOTE — Progress Notes (Signed)
OT Cancellation Note  Patient Details Name: [name] MRN: [id number] DOB: 1940/07/08   Cancelled Treatment:    Reason Eval/Treat Not Completed: OT screened, no needs identified, will sign off (screened for splint, no needs identified ) Plan is for patient to discharge > SNF. No acute OT needs identified, all needs can be met in SNF. Please send text page to OT services if any questions, concerns, or with new orders: [phone] OR call office at [phone]. Thank you for the order.    Khristy Kalan , MS, OTR/L, CLT Pager: [phone]  08/28/2014, 11:29 AM

## 2014-08-28 NOTE — Clinical Social Work Placement (Signed)
   CLINICAL SOCIAL WORK PLACEMENT  NOTE  Date:  08/28/2014  Patient Details  Name: [name] MRN: [id number] Date of Birth: 1941/03/07  Clinical Social Work is seeking post-discharge placement for this patient at the [place] level of care (*CSW will initial, date and re-position this form in  chart as items are completed):  Yes   Patient/family provided with [place] Work Department's list of facilities offering this level of care within the geographic area requested by the patient (or if unable, by the patient's family).  Yes   Patient/family informed of their freedom to choose among providers that offer the needed level of care, that participate in Medicare, Medicaid or managed care program needed by the patient, have an available bed and are willing to accept the patient.  Yes   Patient/family informed of [facility]'s ownership interest in [facility] and [facility], as well as of the fact that they are under no obligation to receive care at these facilities.  PASRR submitted to EDS on       PASRR number received on       Existing PASRR number confirmed on 08/27/14     FL2 transmitted to all facilities in geographic area requested by pt/family on 08/28/14     FL2 transmitted to all facilities within larger geographic area on       Patient informed that his/her managed care company has contracts with or will negotiate with certain facilities, including the following:        Yes   Patient/family informed of bed offers received.  Patient chooses bed at [facility]     Physician recommends and patient chooses bed at      Patient to be transferred to [facility] on 08/28/14.  Patient to be transferred to facility by Ambulance     Patient family notified on 08/28/14 of transfer.  Name of family member notified:  Patient's landlord and friend notify by phone.     PHYSICIAN        Additional Comment:  Per MD patient ready for DC to [facility] with palliative services. RN, patient, patient's family, and facility notified of DC. RN given number for report. DC packet on chart. Ambulance transport requested for patient. CSW signing off.   _______________________________________________ Rigoberto Noel, LCSW 08/28/2014, 4:45 PM

## 2014-08-28 NOTE — Progress Notes (Signed)
Chaplain respond to Consult per- Chaplain  Chaplain Presence:  Listen to [name] as he reflected upon his current circumstance and life. He will be leaving today at 1:30pm under Palliative Care. He is in a good place with his life and has  fear of dying.  He discussed his theology and how his faith has provided for him over the years.   08/28/14 1100  Clinical Encounter Type  Visited With Patient;Health care provider  Visit Type Initial;Spiritual support  Referral From Chaplain  Consult/Referral To Chaplain;Nurse;Palliative care  Spiritual Encounters  Spiritual Needs Emotional

## 2014-08-28 NOTE — Progress Notes (Addendum)
Veatrice Kells discharged Skilled nursing facility per MD order.  Report called to receiving Vincente Liberty, LPN at [facility] [place].     Medication List    STOP taking these medications        morphine CONCENTRATE 10 MG/0.5ML Soln concentrated solution      TAKE these medications        dexamethasone 4 MG tablet  Commonly known as:  DECADRON  Take 1 tablet (4 mg total) by mouth every 12 (twelve) hours.     feeding supplement (NEPRO CARB STEADY) Liqd  Take 237 mLs by mouth 3 (three) times daily between meals.     ferrous sulfate 325 (65 FE) MG tablet  Take 1 tablet (325 mg total) by mouth daily with breakfast.     HYDROmorphone 2 MG tablet  Commonly known as:  DILAUDID  Take 0.5 tablets (1 mg total) by mouth every 2 (two) hours as needed for severe pain.     LORazepam 2 MG/ML concentrated solution  Commonly known as:  ATIVAN  Take 0.5 mLs (1 mg total) by mouth every 6 (six) hours as needed for anxiety.     LORazepam 0.5 MG tablet  Commonly known as:  ATIVAN  Take 0.5 mg by mouth every 6 (six) hours as needed for anxiety. for anxiety     omeprazole 20 MG capsule  Commonly known as:  PRILOSEC  Take 20 mg by mouth daily.     Potassium Chloride ER 20 MEQ Tbcr  Take 40 mEq by mouth daily.     sodium bicarbonate 650 MG tablet  Take 1 tablet by mouth 2 (two) times daily.     sulfamethoxazole-trimethoprim 800-160 MG per tablet  Commonly known as:  BACTRIM DS,SEPTRA DS  Take 1 tablet by mouth 2 (two) times daily.     trolamine salicylate 10 % cream  Commonly known as:  ASPERCREME  Apply 1 application topically as needed for muscle pain.        Patients skin is clean, dry and intact, no evidence of skin break down, abrasion to left hip. IV site discontinued and catheter remains intact. Site without signs and symptoms of complications. Dressing and pressure applied.  Patient transported on a stretcher by non emergent EMS,  no distress noted upon  discharge.  Wynetta Emery, Letesha Klecker C 08/28/2014 6:52 PM

## 2014-08-29 ENCOUNTER — Non-Acute Institutional Stay (SKILLED_NURSING_FACILITY): Payer: Medicare Other | Admitting: Internal Medicine

## 2014-08-29 ENCOUNTER — Encounter: Payer: Self-pay | Admitting: Internal Medicine

## 2014-08-29 DIAGNOSIS — A419 Sepsis, unspecified organism: Secondary | ICD-10-CM | POA: Diagnosis not present

## 2014-08-29 DIAGNOSIS — E43 Unspecified severe protein-calorie malnutrition: Secondary | ICD-10-CM | POA: Diagnosis not present

## 2014-08-29 DIAGNOSIS — R627 Adult failure to thrive: Secondary | ICD-10-CM | POA: Diagnosis not present

## 2014-08-29 DIAGNOSIS — C801 Malignant (primary) neoplasm, unspecified: Secondary | ICD-10-CM | POA: Diagnosis not present

## 2014-08-29 DIAGNOSIS — J449 Chronic obstructive pulmonary disease, unspecified: Secondary | ICD-10-CM

## 2014-08-29 DIAGNOSIS — C7949 Secondary malignant neoplasm of other parts of nervous system: Secondary | ICD-10-CM

## 2014-08-29 DIAGNOSIS — C7931 Secondary malignant neoplasm of brain: Secondary | ICD-10-CM | POA: Diagnosis not present

## 2014-08-29 DIAGNOSIS — E878 Other disorders of electrolyte and fluid balance, not elsewhere classified: Secondary | ICD-10-CM | POA: Diagnosis not present

## 2014-08-29 DIAGNOSIS — N184 Chronic kidney disease, stage 4 (severe): Secondary | ICD-10-CM

## 2014-08-29 DIAGNOSIS — R6520 Severe sepsis without septic shock: Secondary | ICD-10-CM

## 2014-08-29 NOTE — Progress Notes (Signed)
Patient ID: [name], male   DOB: 08/30/1940, 74 y.o.   MRN: [id number]    HISTORY AND PHYSICAL   DATE: 08/29/14  Location:  [facility] Service: SNF [phone])   Extended Emergency Contact Information Primary Emergency Contact: [place] [facility] [place] Phone: [phone] Relation: Other  Advanced Directive information  FULL CODE  Chief Complaint  Patient presents with  . 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 Hospital records reviewed. Unable to determine if met CA due to colon vs different primary. He has a foley in suprapubic cath. He is not an HD candidate due to cancer and is a poor candidate for systemic chemotx [facility] He will complete Bactrim DS po on 6/15th. Dilaudid added for pain as it is not cleared renally. Hospice consulted.  Today he reports he does not know what his dx are and is concerned that he has not been kept informed. He c/o generalized pain but mostly in legs. No CP or new SOB. No nursing issues. He has a poor appetite. Sleeping well.  He is a poor historian due to mental status. Hx obtained form chart  COPD - no exacerbations. Currently takes no meds  GERD - stable on omeprazole  Anxiety - stable on lorazepam prn  He takes vitamins/mineral supplements daily. He takes dexamethasone for spinal mets and pain  Past Medical History  Diagnosis Date  . COPD (chronic obstructive pulmonary disease)   . Cancer   . GERD (gastroesophageal reflux disease)   . BPH (benign prostatic hyperplasia)   . Systolic congestive heart failure   . CKD (chronic kidney disease), stage IV     Past  Surgical History  Procedure Laterality Date  . Abdominal surgery    . Radiology with anesthesia N/A 07/18/2014    Procedure: MRI - T-Spine without Contrast;  Surgeon: Medication Radiologist, MD;  Location: [facility];  Service: Radiology;  Laterality: N/A;  . Colonoscopy      No care team member to display  History   Social History  . Marital Status: Single    Spouse Name: N/A  . Number of Children: N/A  . Years of Education: N/A   Occupational History  . Not on file.   Social History Main Topics  . Smoking status: Former Research scientist (life sciences)  . Smokeless tobacco: Not on file  . Alcohol Use: No  . Drug Use: No  . Sexual Activity: Not on file   Other Topics Concern  . Not on file   Social History Narrative     reports that he has quit smoking. He does not have any smokeless tobacco history on file. He reports that he does not drink alcohol or use illicit drugs.  Family History  Problem Relation Age of Onset  .      No family status information on file.    Immunization History  Administered Date(s) Administered  . Influenza Whole 01/18/2007  . Td 02/15/2003    No Known Allergies  Medications: Patient's Medications  New Prescriptions   No medications on file  Previous Medications   DEXAMETHASONE (DECADRON) 4 MG TABLET    Take  1 tablet (4 mg total) by mouth every 12 (twelve) hours.   FERROUS SULFATE 325 (65 FE) MG TABLET    Take 1 tablet (325 mg total) by mouth daily with breakfast.   HYDROMORPHONE (DILAUDID) 2 MG TABLET    Take 0.5 tablets (1 mg total) by mouth every 2 (two) hours as needed for severe pain.   LORAZEPAM (ATIVAN) 0.5 MG TABLET    Take 0.5 mg by mouth every 6 (six) hours as needed for anxiety. for anxiety   LORAZEPAM (ATIVAN) 2 MG/ML CONCENTRATED SOLUTION    Take 0.5 mLs (1 mg total) by mouth every 6 (six) hours as needed for anxiety.   NUTRITIONAL SUPPLEMENTS (FEEDING SUPPLEMENT, NEPRO CARB STEADY,) LIQD    Take 237 mLs by mouth 3 (three) times daily between meals.    OMEPRAZOLE (PRILOSEC) 20 MG CAPSULE    Take 20 mg by mouth daily.   POTASSIUM CHLORIDE 20 MEQ TBCR    Take 40 mEq by mouth daily.   SODIUM BICARBONATE 650 MG TABLET    Take 1 tablet by mouth 2 (two) times daily.   SULFAMETHOXAZOLE-TRIMETHOPRIM (BACTRIM DS,SEPTRA DS) 800-160 MG PER TABLET    Take 1 tablet by mouth 2 (two) times daily.   TROLAMINE SALICYLATE (ASPERCREME) 10 % CREAM    Apply 1 application topically as needed for muscle pain.  Modified Medications   No medications on file  Discontinued Medications   No medications on file    Review of Systems  Unable to perform ROS: Other    Filed Vitals:   08/29/14 1731  BP: 145/60  Pulse: 104  Temp: 97.8 F (36.6 C)  Weight: 111 lb (50.349 kg)  SpO2: 98%   Body mass index is 13.16 kg/(m^2).  Physical Exam  Constitutional: He appears cachectic. He has a sickly appearance.  Lying in bed, cachexic appearing. NAD  HENT:  Mouth/Throat: Oropharynx is clear and moist.  Eyes: Pupils are equal, round, and reactive to light. No scleral icterus.  Neck: Neck supple. Carotid bruit is not present. No thyromegaly present.  Cardiovascular: Regular rhythm, normal heart sounds and intact distal pulses.  Tachycardia present.  Exam reveals no gallop and no friction rub.   No murmur heard. no distal LE swelling. No calf TTP  Pulmonary/Chest: Effort normal and breath sounds normal. He has no wheezes. He has no rales. He exhibits no tenderness.  Abdominal: Soft. Bowel sounds are normal. He exhibits no distension, no abdominal bruit, no pulsatile midline mass and no mass. There is no tenderness. There is no rebound and no guarding.  Genitourinary:  Suprapubic foley cath DTG yellow red urine with sediment noted  Musculoskeletal: He exhibits tenderness.  Lymphadenopathy:    He has no cervical adenopathy.  Neurological: He is alert.  Skin: Skin is warm and dry. No rash noted.  Psychiatric: He has a normal mood and affect. His behavior is normal.       Labs reviewed: Admission on 08/23/2014, Discharged on 08/28/2014  No results displayed because visit has over 200 results.    CBC Latest Ref Rng 08/28/2014 08/27/2014 08/24/2014  WBC 4.0 - 10.5 K/uL 16.7(H) 13.5(H) 17.5(H)  Hemoglobin 13.0 - 17.0 g/dL 8.9(L) 8.2(L) 9.5(L)  Hematocrit 39.0 - 52.0 % 29.1(L) 27.2(L) 31.4(L)  Platelets 150 - 400 K/uL 223 276 308    CMP Latest Ref Rng 08/28/2014 08/27/2014 08/26/2014  Glucose 65 - 99 mg/dL 122(H) 137(H) 132(H)  BUN 6 - 20 mg/dL 32(H) 45(H) 60(H)  Creatinine 0.61 - 1.24 mg/dL  2.25(H) 2.33(H) 2.65(H)  Sodium 135 - 145 mmol/L 137 137 138  Potassium 3.5 - 5.1 mmol/L 3.0(L) 3.2(L) 2.9(L)  Chloride 101 - 111 mmol/L 88(L) 90(L) 90(L)  CO2 22 - 32 mmol/L 37(H) 35(H) 34(H)  Calcium 8.9 - 10.3 mg/dL 8.5(L) 8.8(L) 8.9  Total Protein 6.5 - 8.1 g/dL - - -  Total Bilirubin 0.3 - 1.2 mg/dL - - -  Alkaline Phos 38 - 126 U/L - - -  AST 15 - 41 U/L - - -  ALT 17 - 63 U/L - - -    No results found for: HGBA1C         Dg Chest 2 View  08/24/2014   CLINICAL DATA:  Altered mental status, gluteal ulcer. History of metastatic cancer, COPD.  EXAM: CHEST  2 VIEW  COMPARISON:  CT chest July 14, 2014  FINDINGS: Cardiomediastinal silhouette is nonsuspicious. Increased lung volumes consistent with history of COPD without pleural effusion. Presumed nipple shadow RIGHT lung base. Calcified granuloma. No pneumothorax. Suspected lytic lesion RIGHT humerus versus artifact.  IMPRESSION: COPD without acute cardiopulmonary process.   Electronically Signed   By: Elon Alas M.D.   On: 08/24/2014 00:20   Ct Renal Stone Study  08/24/2014   CLINICAL DATA:  Abdominal pain and leukocytosis  EXAM: CT ABDOMEN AND PELVIS WITHOUT CONTRAST  TECHNIQUE: Multidetector CT imaging of the abdomen and pelvis was performed following the standard protocol without IV contrast.  COMPARISON:  07/14/2014  FINDINGS: There is no evidence of bowel obstruction. No acute inflammatory changes  are evident. There is no abscess, ascites or drainable fluid collection.  There is moderate atrophy of both kidneys. There is chronic hydronephrosis and hydroureter bilaterally. There is mural thickening of the ureters and renal collecting systems consistent with chronic pyelonephritis.  There is marked irregularity of the urinary bladder with mass like nodularity and mural thickening. There is a suprapubic urinary bladder catheter.  There are grossly unremarkable unenhanced appearances of the liver and bile ducts. There is a 8 mm calculus within the gallbladder lumen. There are unremarkable unenhanced appearances of the spleen, pancreas and adrenals.  There is progression of skeletal metastatic disease. The right iliac lesion measures 6.5 x 10.6 by 11.0 cm and previously measured 4.8 x 7.1 by 7.4 cm. The right L5 transverse process mass measures 2.2 x 3.2 x 3.4 cm and previously measured 1.5 x 2.4 x 2.6 cm. There is a new skeletal metastasis involving the inferior left pubic ramus. The right pubic rami CT metastasis is only slightly larger, measuring 3.8 x 4.4 cm and previously measuring 3.4 x 4.3 cm.  There is no significant abnormality in the lower chest.  IMPRESSION: Bilateral renal atrophy with chronic hydronephrosis and hydroureter. Moderate mural thickening of the ureters and collecting systems, consistent with chronic pyelonephritis. The urinary bladder is markedly irregular, perhaps involved with multiple masses. This is not significantly changed from 07/14/2014. Unremarkable suprapubic urinary bladder catheter.  No acute inflammatory changes are evident in the abdomen or pelvis.  Negative for abscess, extraluminal air, or bowel obstruction.  Cholelithiasis  Progression of skeletal metastatic disease.   Electronically Signed   By: Andreas Newport M.D.   On: 08/24/2014 05:14   Dg Femur, Min 2 Views Right  08/24/2014   CLINICAL DATA:  Right hip and leg pain. Vague history of recent fall.  EXAM: RIGHT  FEMUR 2 VIEWS  COMPARISON:  None.  FINDINGS: There is no evidence of fracture or other focal bone lesions. Soft tissues  are unremarkable.  IMPRESSION: Negative.   Electronically Signed   By: Andreas Newport M.D.   On: 08/24/2014 00:21     Assessment/Plan   ICD-9-CM ICD-10-CM   1. 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 Failure to thrive in adult- due to #5, 1, 6 783.7 R62.7   3. Protein-calorie malnutrition, severe - due to #2 262 E43   4. CKD (chronic kidney disease), stage IV 585.4 N18.4   5. Metastasis to spine 198.3 C79.31   6. Cancer - hx colon ca s/p hemicolectomy 199.1 C80.1   7. Electrolyte disturbance 276.9 E87.8   8. Chronic obstructive pulmonary disease, unspecified COPD, unspecified chronic bronchitis type - stable 496 J44.9     --start CONTACT ISOLATION due to ESBL E coli UTI  --complete abx  --continue pain control  --hospice consult  --cont meds as ordered  --change foley suprapubic cath q 2weeks as indicated  --GOAL: short term rehab and d/c home when medically appropriate. Communicated with pt and nursing.  --will follow  Pacey Altizer S. Perlie Gold  [facility] [address] [place], [place] [address] [phone] Cell (Monday-Friday 8 AM - 5 PM) [phone] After 5 PM and follow prompts

## 2014-08-30 LAB — CULTURE, BLOOD (ROUTINE X 2)
CULTURE: NO GROWTH
Culture: NO GROWTH

## 2014-09-11 ENCOUNTER — Inpatient Hospital Stay (HOSPITAL_COMMUNITY)
Admission: EM | Admit: 2014-09-11 | Discharge: 2014-09-15 | DRG: 871 | Disposition: E | Payer: Medicare Other | Attending: Family Medicine | Admitting: Family Medicine

## 2014-09-11 DIAGNOSIS — Z7952 Long term (current) use of systemic steroids: Secondary | ICD-10-CM

## 2014-09-11 DIAGNOSIS — R627 Adult failure to thrive: Secondary | ICD-10-CM | POA: Diagnosis present

## 2014-09-11 DIAGNOSIS — A419 Sepsis, unspecified organism: Principal | ICD-10-CM | POA: Diagnosis present

## 2014-09-11 DIAGNOSIS — G9341 Metabolic encephalopathy: Secondary | ICD-10-CM | POA: Diagnosis present

## 2014-09-11 DIAGNOSIS — I5022 Chronic systolic (congestive) heart failure: Secondary | ICD-10-CM | POA: Diagnosis present

## 2014-09-11 DIAGNOSIS — Z87891 Personal history of nicotine dependence: Secondary | ICD-10-CM

## 2014-09-11 DIAGNOSIS — N400 Enlarged prostate without lower urinary tract symptoms: Secondary | ICD-10-CM | POA: Diagnosis present

## 2014-09-11 DIAGNOSIS — J9600 Acute respiratory failure, unspecified whether with hypoxia or hypercapnia: Secondary | ICD-10-CM | POA: Diagnosis present

## 2014-09-11 DIAGNOSIS — C7951 Secondary malignant neoplasm of bone: Secondary | ICD-10-CM | POA: Diagnosis present

## 2014-09-11 DIAGNOSIS — Z515 Encounter for palliative care: Secondary | ICD-10-CM | POA: Diagnosis not present

## 2014-09-11 DIAGNOSIS — I2510 Atherosclerotic heart disease of native coronary artery without angina pectoris: Secondary | ICD-10-CM | POA: Diagnosis present

## 2014-09-11 DIAGNOSIS — G9340 Encephalopathy, unspecified: Secondary | ICD-10-CM | POA: Diagnosis not present

## 2014-09-11 DIAGNOSIS — K219 Gastro-esophageal reflux disease without esophagitis: Secondary | ICD-10-CM | POA: Diagnosis present

## 2014-09-11 DIAGNOSIS — J449 Chronic obstructive pulmonary disease, unspecified: Secondary | ICD-10-CM | POA: Diagnosis present

## 2014-09-11 DIAGNOSIS — Z79899 Other long term (current) drug therapy: Secondary | ICD-10-CM

## 2014-09-11 DIAGNOSIS — N184 Chronic kidney disease, stage 4 (severe): Secondary | ICD-10-CM | POA: Diagnosis present

## 2014-09-11 DIAGNOSIS — J9601 Acute respiratory failure with hypoxia: Secondary | ICD-10-CM | POA: Diagnosis not present

## 2014-09-11 DIAGNOSIS — Z66 Do not resuscitate: Secondary | ICD-10-CM | POA: Diagnosis present

## 2014-09-11 DIAGNOSIS — J9690 Respiratory failure, unspecified, unspecified whether with hypoxia or hypercapnia: Secondary | ICD-10-CM | POA: Diagnosis present

## 2014-09-11 DIAGNOSIS — C801 Malignant (primary) neoplasm, unspecified: Secondary | ICD-10-CM | POA: Diagnosis not present

## 2014-09-11 DIAGNOSIS — C799 Secondary malignant neoplasm of unspecified site: Secondary | ICD-10-CM | POA: Diagnosis present

## 2014-09-11 MED ORDER — CETYLPYRIDINIUM CHLORIDE 0.05 % MT LIQD: 7.0000 mL | Freq: Two times a day (BID) | OROMUCOSAL | Status: DC

## 2014-09-11 MED ORDER — ONDANSETRON HCL 4 MG/2ML IJ SOLN: 4.0000 mg | Freq: Four times a day (QID) | INTRAMUSCULAR | Status: DC | PRN

## 2014-09-11 MED ORDER — LORAZEPAM 2 MG/ML IJ SOLN: 1.0000 mg | INTRAMUSCULAR | Status: DC | PRN

## 2014-09-11 MED ORDER — CHLORHEXIDINE GLUCONATE 0.12 % MT SOLN
15.0000 mL | Freq: Two times a day (BID) | OROMUCOSAL | Status: DC
  Administered 2014-09-11 – 2014-09-12 (×2): 15 mL via OROMUCOSAL

## 2014-09-11 MED ORDER — ACETAMINOPHEN 325 MG PO TABS: 650.0000 mg | ORAL_TABLET | Freq: Four times a day (QID) | ORAL | Status: DC | PRN

## 2014-09-11 MED ORDER — MORPHINE SULFATE (CONCENTRATE) 10 MG/0.5ML PO SOLN: 5.0000 mg | ORAL | Status: DC | PRN

## 2014-09-11 MED ORDER — ATROPINE SULFATE 1 % OP SOLN: 4.0000 [drp] | OPHTHALMIC | Status: DC | PRN

## 2014-09-11 MED ORDER — ACETAMINOPHEN 650 MG RE SUPP: 650.0000 mg | Freq: Four times a day (QID) | RECTAL | Status: DC | PRN

## 2014-09-11 MED ORDER — LORAZEPAM 1 MG PO TABS: 1.0000 mg | ORAL_TABLET | ORAL | Status: DC | PRN

## 2014-09-11 MED ORDER — MORPHINE SULFATE (CONCENTRATE) 10 MG/0.5ML PO SOLN
5.0000 mg | ORAL | Status: DC | PRN
  Administered 2014-09-11 – 2014-09-12 (×2): 5 mg via SUBLINGUAL
  Filled 2014-09-11 (×2): qty 0.5

## 2014-09-11 MED ORDER — LORAZEPAM 2 MG/ML IJ SOLN
1.0000 mg | INTRAMUSCULAR | Status: DC | PRN
Start: 2014-09-11 — End: 2014-09-12

## 2014-09-11 MED ORDER — LORAZEPAM 2 MG/ML PO CONC: 1.0000 mg | ORAL | Status: DC | PRN

## 2014-09-11 MED ORDER — ONDANSETRON 4 MG PO TBDP
4.0000 mg | ORAL_TABLET | Freq: Four times a day (QID) | ORAL | Status: DC | PRN
  Filled 2014-09-11: qty 1

## 2014-09-11 NOTE — ED Notes (Signed)
Attempted report 

## 2014-09-11 NOTE — ED Notes (Signed)
Dr Coralyn Pear at bedside.  Unable to find family, even in parking lot.

## 2014-09-11 NOTE — ED Provider Notes (Signed)
CSN: [id number]     Arrival date & time 08/25/2014  1345 History   First MD Initiated Contact with Patient 08/30/2014 1359     Chief Complaint  Patient presents with  . Respiratory Distress     (Consider location/radiation/quality/duration/timing/severity/associated sxs/prior Treatment) HPI Comments: Patient presents to the emergency department for evaluation of difficulty breathing. Patient is brought to the emergency department from skilled nursing facility. Patient was noted to be exhibiting decreased level of consciousness prior to arrival. EMS report that he has been exhibiting agonal breathing and they have been assisting his respirations by bag-valve-mask.  We hospitalized for sepsis. He was discharged to the nursing home. Patient's closest relatives, 2 nieces, or here in the ER. They report that he did not want to die at the nursing home. He has been a full code prior to today.  Level V Caveat due to medical condition.   Past Medical History  Diagnosis Date  . COPD (chronic obstructive pulmonary disease)   . Cancer   . GERD (gastroesophageal reflux disease)   . BPH (benign prostatic hyperplasia)   . Systolic congestive heart failure   . CKD (chronic kidney disease), stage IV    Past Surgical History  Procedure Laterality Date  . Abdominal surgery    . Radiology with anesthesia N/A 07/18/2014    Procedure: MRI - T-Spine without Contrast;  Surgeon: Medication Radiologist, MD;  Location: [facility];  Service: Radiology;  Laterality: N/A;  . Colonoscopy     Family History  Problem Relation Age of Onset  .      History  Substance Use Topics  . Smoking status: Former Research scientist (life sciences)  . Smokeless tobacco: Not on file  . Alcohol Use: No    Review of Systems  Unable to perform ROS: Acuity of condition      Allergies  Review of patient's allergies indicates no known allergies.  Home Medications   Prior to Admission medications   Medication Sig Start Date End Date Taking? Authorizing  Provider  dexamethasone (DECADRON) 4 MG tablet Take 1 tablet (4 mg total) by mouth every 12 (twelve) hours. 07/20/14   Thurnell Lose, MD  ferrous sulfate 325 (65 FE) MG tablet Take 1 tablet (325 mg total) by mouth daily with breakfast. Patient not taking: Reported on 08/23/2014 10/10/13   Kinnie Feil, MD  HYDROmorphone (DILAUDID) 2 MG tablet Take 0.5 tablets (1 mg total) by mouth every 2 (two) hours as needed for severe pain. 08/28/14   Nita Sells, MD  LORazepam (ATIVAN) 0.5 MG tablet Take 0.5 mg by mouth every 6 (six) hours as needed for anxiety. for anxiety 08/09/14   Historical Provider, MD  LORazepam (ATIVAN) 2 MG/ML concentrated solution Take 0.5 mLs (1 mg total) by mouth every 6 (six) hours as needed for anxiety. Patient not taking: Reported on 08/23/2014 07/20/14   Thurnell Lose, MD  Nutritional Supplements (FEEDING SUPPLEMENT, NEPRO CARB STEADY,) LIQD Take 237 mLs by mouth 3 (three) times daily between meals. Patient not taking: Reported on 08/23/2014 07/20/14   Thurnell Lose, MD  omeprazole (PRILOSEC) 20 MG capsule Take 20 mg by mouth daily.    Historical Provider, MD  potassium chloride 20 MEQ TBCR Take 40 mEq by mouth daily. 08/28/14   Nita Sells, MD  sodium bicarbonate 650 MG tablet Take 1 tablet by mouth 2 (two) times daily. 05/24/14   Historical Provider, MD  sulfamethoxazole-trimethoprim (BACTRIM DS,SEPTRA DS) 800-160 MG per tablet Take 1 tablet by mouth 2 (two) times  daily. 08/28/14   Nita Sells, MD  trolamine salicylate (ASPERCREME) 10 % cream Apply 1 application topically as needed for muscle pain.    Historical Provider, MD   BP 82/61 mmHg  Pulse 55  Resp 27  SpO2 95% Physical Exam  Constitutional:  Cachectic  HENT:  Head: Atraumatic.  Eyes: Pupils are equal, round, and reactive to light.  Neck: Neck supple.  Cardiovascular: Regular rhythm and normal heart sounds.   Pulmonary/Chest:  Shallow respirations, bradypneic  Abdominal: Soft.   Musculoskeletal: He exhibits no edema.  Neurological:  withdraws from pain, not alert, noncommunicative  Skin: Skin is warm and dry.    ED Course  Procedures (including critical care time) Labs Review Labs Reviewed - No data to display  Imaging Review No results found.   EKG Interpretation None      MDM   Final diagnoses:  None   metastatic cancer, unknown primary, end-stage Comfort measures  Patient presents to the emergency department from nursing home. Patient was recently treated for severe sepsis secondary to Escherichia coli in his urine. He went to a skilled nursing facility at time of discharge. He has had a decline today. At arrival he is unresponsive, exhibiting agonal breathing.  A lengthy conversation with the family confirmed that they would be comfort measures. I did discuss with them the possibility of CPR, intubation. Family understands that this would be unlikely to extend his life. His only wish was that he did not die at the nursing home. He will therefore be admitted to the hospital with comfort measures, palliative care consult.    Orpah Greek, MD 09/02/2014 [phone]

## 2014-09-11 NOTE — Progress Notes (Signed)
Chaplain responded to ED  Spiritual support for family and accompanied the doctor during his visit with family.  Mr. Kloos will be moved to 6N-3.   09/08/2014 1700  Clinical Encounter Type  Visited With Family;Health care provider  Visit Type Initial;Spiritual support  Referral From Nurse  Spiritual Encounters  Spiritual Needs Emotional

## 2014-09-11 NOTE — ED Notes (Addendum)
Pt from [facility] after being found unresponsive.  Staff told EMS they were doing CPR.  When EMS arrived pt was laying in bed with shallow rr of 24 78%, capnography of 8, nsr.  Recently tx for sepsis.  Per EMS, pt has requested full code status.  Family now states no cpr or intubation.  Pt with assisted ventilations on arrival.

## 2014-09-11 NOTE — Progress Notes (Signed)
Progress note  I spoke with family members, we discussed goals of care, they wish to pursue comfort and would like for him to transition to inpatient hospice if he does not pass in the next 24 hours. Comfort care orders written.

## 2014-09-11 NOTE — ED Notes (Signed)
Tamsen Snider (niece) [phone] Call [facility]

## 2014-09-11 NOTE — H&P (Signed)
[facility] History and Physical  Yoan Sallade NIO:[id number] DOB: 01/05/1941 DOA: 09/10/2014  Referring physician:  PCP: No primary care provider on file.   Chief Complaint: Mental status changes  HPI: Shoichi Mielke is a 74 y.o. male with a past medical history of  metastatic adenocarcinoma cancer, unknown primary, stage IV chronic kidney disease, systolic congestive heart failure, admitted to the medicine service on 08/23/2014 and discharged on 08/28/2014 at which time he was treated for severe sepsis secondary to ESBL Escherichia coli. He was discharged to a skilled nursing facility on Bactrim therapy. Prior to discharge he was set up with Palliative care services. At his nursing facility he was found to continue to have failure to thrive and suffering a steep functional decline in the past 24 hours. EMS found patient to be having agonal breathing, lethargy, and was put on a bag valve mask. In the emergency department he was found to be obtunded, cannot follow commands, unable to be aroused.  Systolic blood pressures were in the 80s, having respiratory rate of 27. Dr. Betsey Holiday on emergency medicine spoke with his 2 nieces at bedside regarding his goals of care. Overall prognosis is quite poor in Mr. Swigert case as he has likely approached end-of-life. His family members wish to pursue full comfort care. When I went to the emergency department I could not locate his family members. I tried calling the telephone listed on Epic however it went to voicemail. Instructed nursing staff to call me as soon as family members came back.                                                      Review of Systems:  Could not obtain reliable review of systems  Past Medical History  Diagnosis Date  . COPD (chronic obstructive pulmonary disease)   . Cancer   . GERD (gastroesophageal reflux disease)   . BPH (benign prostatic hyperplasia)   . Systolic congestive heart failure   . CKD (chronic kidney disease),  stage IV    Past Surgical History  Procedure Laterality Date  . Abdominal surgery    . Radiology with anesthesia N/A 07/18/2014    Procedure: MRI - T-Spine without Contrast;  Surgeon: Medication Radiologist, MD;  Location: [facility];  Service: Radiology;  Laterality: N/A;  . Colonoscopy     Social History:  reports that he has quit smoking. He does not have any smokeless tobacco history on file. He reports that he does not drink alcohol or use illicit drugs.  No Known Allergies  Family History  Problem Relation Age of Onset  .       Prior to Admission medications   Medication Sig Start Date End Date Taking? Authorizing Provider  dexamethasone (DECADRON) 4 MG tablet Take 1 tablet (4 mg total) by mouth every 12 (twelve) hours. 07/20/14  Yes Thurnell Lose, MD  ferrous sulfate 325 (65 FE) MG tablet Take 1 tablet (325 mg total) by mouth daily with breakfast. 10/10/13  Yes Kinnie Feil, MD  HYDROmorphone (DILAUDID) 2 MG tablet Take 0.5 tablets (1 mg total) by mouth every 2 (two) hours as needed for severe pain. 08/28/14  Yes Nita Sells, MD  LORazepam (ATIVAN) 0.5 MG tablet Take 0.5 mg by mouth every 6 (six) hours as needed for anxiety. for anxiety 08/09/14  Yes Historical  Provider, MD  LORazepam (ATIVAN) 2 MG/ML concentrated solution Take 0.5 mLs (1 mg total) by mouth every 6 (six) hours as needed for anxiety. 07/20/14  Yes Thurnell Lose, MD  Nutritional Supplements (FEEDING SUPPLEMENT, NEPRO CARB STEADY,) LIQD Take 237 mLs by mouth 3 (three) times daily between meals. 07/20/14  Yes Thurnell Lose, MD  omeprazole (PRILOSEC) 20 MG capsule Take 20 mg by mouth daily.   Yes Historical Provider, MD  potassium chloride 20 MEQ TBCR Take 40 mEq by mouth daily. 08/28/14  Yes Nita Sells, MD  sodium bicarbonate 650 MG tablet Take 1 tablet by mouth 2 (two) times daily. 05/24/14  Yes Historical Provider, MD  sulfamethoxazole-trimethoprim (BACTRIM DS,SEPTRA DS) 800-160 MG per tablet Take 1  tablet by mouth 2 (two) times daily. 08/28/14  Yes Nita Sells, MD  trolamine salicylate (ASPERCREME) 10 % cream Apply 1 application topically as needed for muscle pain.   Yes Historical Provider, MD  tuberculin (APLISOL) 5 UNIT/0.1ML injection Inject 5 Units into the skin once a week. Pt is administered on Monday and results read Wednesday   Yes Historical Provider, MD   Physical Exam: Filed Vitals:   09/09/2014 1345 09/14/2014 1346  BP: 82/61   Pulse: 60 55  Resp: 23 27  SpO2: 95% 95%    Wt Readings from Last 3 Encounters:  08/29/14 50.349 kg (111 lb)  08/26/14 50.5 kg (111 lb 5.3 oz)  07/12/14 55 kg (121 lb 4.1 oz)    General:  Patient is on responsive, unarousable, appears cachectic, significant weight loss, chronically ill Eyes: Pupils about 2 mm, equal and round. Could not assess extraocular movement ENT: grossly normal hearing, dry oral mucosa Neck: no LAD, masses or thyromegaly Cardiovascular: RRR, no m/r/g. 2/6 systolic ejection murmur Respiratory: Coarse respiratory sounds, positive rhonchi and crackles Abdomen: soft, ntnd Skin: no rash or induration seen on limited exam Musculoskeletal: Patient having significant muscle atrophy across all extremities Psychiatric: Unresponsive Neurologic: Cannot perform adequate neurologic examination as patient is unresponsive           Labs on Admission:  Basic Metabolic Panel: No results for input(s): NA, K, CL, CO2, GLUCOSE, BUN, CREATININE, CALCIUM, MG, PHOS in the last 168 hours. Liver Function Tests: No results for input(s): AST, ALT, ALKPHOS, BILITOT, PROT, ALBUMIN in the last 168 hours. No results for input(s): LIPASE, AMYLASE in the last 168 hours. No results for input(s): AMMONIA in the last 168 hours. CBC: No results for input(s): WBC, NEUTROABS, HGB, HCT, MCV, PLT in the last 168 hours. Cardiac Enzymes: No results for input(s): CKTOTAL, CKMB, CKMBINDEX, TROPONINI in the last 168 hours.  BNP (last 3 results) No  results for input(s): BNP in the last 8760 hours.  ProBNP (last 3 results)  Recent Labs  10/06/13 2143  PROBNP 1216.0*    CBG: No results for input(s): GLUCAP in the last 168 hours.  Radiological Exams on Admission: No results found.  EKG: Independently reviewed.   Assessment/Plan Active Problems:   Metastatic cancer   Respiratory failure   Failure to thrive in adult   CKD (chronic kidney disease), stage IV   Acute encephalopathy   1. Acute respiratory failure. Could be secondary to aspiration, COPD or progression of underlying malignancy. Patient requiring a nonrebreather. Further workup was not done as patient was transition to comfort care in the emergency department. 2. Metastatic cancer. Issue with history of adenocarcinoma status post hemicolectomy in 2006, having previous imaging studies showing multifocal lytic bone metastasis with destructive lesion  in the T11 vertebral extending into the spinal canal and exhibiting mass effect on the spinal cord. Patient had been seen by Dr. Tammi Klippel and Dr.Sherrill at the cancer center. He was recently admitted for sepsis. 3. History of severe sepsis. Patient recently hospitalized from 08/23/2014 through 08/28/2014 at which time he was treated for severe sepsis in setting of yet be L Escherichia coli. He was discharged on Bactrim DS with last day of treatment on 08/30/2014. He presents unresponsive and in acute respiratory failure. Likely to have sepsis. 4. Goals of care. Patient with multiple comorbidities, as mentioned above recent hospitalization for ESBL septicemia, metastatic adenocarcinoma, had been discharge to SNF with palliative care following. Patient's prognosis is quite poor, and is likely in the process of dying. In the emergency department systolic blood pressures were gradually falling getting as low as in the 50s. His oxygen saturations were also dropping. I think he will likely pass in the next 24 hours. Dr Betsey Holiday of  emergency medicine spoke with his family members at bedside who expressed wishes to transition to full comfort, without pursuing further potentially burdensome or invasive procedures. I could not find family members in the emergency department when I came to assess patient. I attempted to call the phone number listed in Epic, no answer. I went with staff members to look for them outside and couldn't find them. Dr. Betsey Holiday documents in his note however the lengthy conversation he had with family members regarding transition to comfort care. Given the stated wishes will focus on comfort and quality of life. Provide Roxanol and Ativan for signs and symptoms of pain or shortness of breath. Have instructed nursing staff to call me once family members arrive.  Code Status: DO NOT RESUSCITATE Family Communication:  Disposition Plan: Will admit patient to the inpatient service  Time spent: 60 min  Kelvin Cellar [facility] Pager [phone]

## 2014-09-12 DIAGNOSIS — R627 Adult failure to thrive: Secondary | ICD-10-CM

## 2014-09-12 DIAGNOSIS — G9340 Encephalopathy, unspecified: Secondary | ICD-10-CM

## 2014-09-12 DIAGNOSIS — J9601 Acute respiratory failure with hypoxia: Secondary | ICD-10-CM

## 2014-09-12 DIAGNOSIS — C801 Malignant (primary) neoplasm, unspecified: Secondary | ICD-10-CM

## 2014-09-12 DIAGNOSIS — N184 Chronic kidney disease, stage 4 (severe): Secondary | ICD-10-CM

## 2014-09-15 NOTE — Care Management (Signed)
UR completed 

## 2014-09-15 NOTE — Progress Notes (Signed)
Upon entering room, pt not breathing, no pulse, and no heart rate heard.  Verified TOD with Derald Macleod, RN.  TOD is 1040.  Niece notified and has not decided on a funeral home at this time.  I explained the process to the niece and that the patient would be cleaned up and taken to the morgue for pickup whenever they decide on a funeral home.  Attending paged.  [facility] notified and referral number in post-mortem check off.   Graceann Congress

## 2014-09-15 NOTE — Progress Notes (Signed)
Pt transported to morgue. [name]

## 2014-09-15 NOTE — Progress Notes (Signed)
Nutrition Brief Note  Chart reviewed. Pt now transitioning to comfort care.  No further nutrition interventions warranted at this time.  Please re-consult as needed.   Keyana Guevara A. Senie Lanese, RD, LDN, CDE Pager: [phone] After hours Pager: [phone]  

## 2014-09-15 NOTE — Discharge Summary (Addendum)
Death Summary  Nivin Braniff OIP:[id number] DOB: 04-05-1940 DOA: 10/06/2014  PCP: No primary care provider on file. PCP/Office notified: yes  Admit date: 10/06/14 Date of Death: 2014-10-07  Final Diagnoses:  Active Problems:   Failure to thrive in adult   CKD (chronic kidney disease), stage IV   Metastatic cancer   Respiratory failure   Acute encephalopathy   History of present illness:   74 y/o ? 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 CKD IV 2/2 to obst uropathy, BPH, Adeno CA Colon s/p Hemicolecctomy, non-obs CAD per 2006 Cardiac cath He was initially diagnosed with Metastatic adeno-ca October 2015-he is a poor candidate for systemic Chemo based on notes from Dr. Benay Spice at index hospitalization He decided to forego XRT and has vacillated between full code vs comfort with Hopsice and "I want everything done"  I d/c him to facility 6/13   He was re-admitted 10-06-22 with acute metabolic encephalopathy in a setting of possible sepsis from urinary casue and expired on 2014/10/07 at 10:40 before i could see him  NO chest rise, Pupils fixed and dilated  Time: 15  Signed:  Nita Sells  [facility] October 07, 2014, 11:18 AM

## 2014-09-15 DEATH — deceased

## 2014-10-08 DIAGNOSIS — C189 Malignant neoplasm of colon, unspecified: Secondary | ICD-10-CM | POA: Insufficient documentation

## 2014-10-08 DIAGNOSIS — E860 Dehydration: Secondary | ICD-10-CM | POA: Insufficient documentation

## 2014-10-08 DIAGNOSIS — C3490 Malignant neoplasm of unspecified part of unspecified bronchus or lung: Secondary | ICD-10-CM | POA: Insufficient documentation

## 2014-10-08 DIAGNOSIS — C799 Secondary malignant neoplasm of unspecified site: Secondary | ICD-10-CM | POA: Insufficient documentation

## 2017-02-16 IMAGING — CT CT BIOPSY
1 series · 10 of 12 positions shown, 16 images · non-contrast
Comparison: none

CLINICAL DATA: Multiple lytic bone lesions.  No known primary.

[Series 7: (hospital) 6.0 b30f · axial · 0.77mm/px · z∈[-129,-126]mm · 10 of 12 slices shown, 16 images]
[im 2/12  soft-tissue]
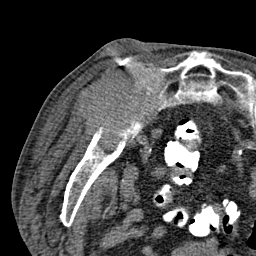
[im 2/12  lung]
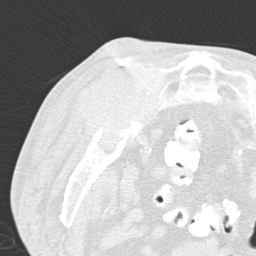
[im 2/12  bone]
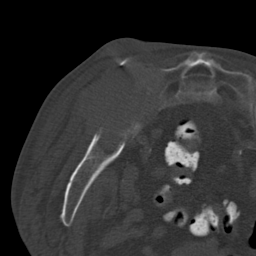
[im 3/12  soft-tissue]
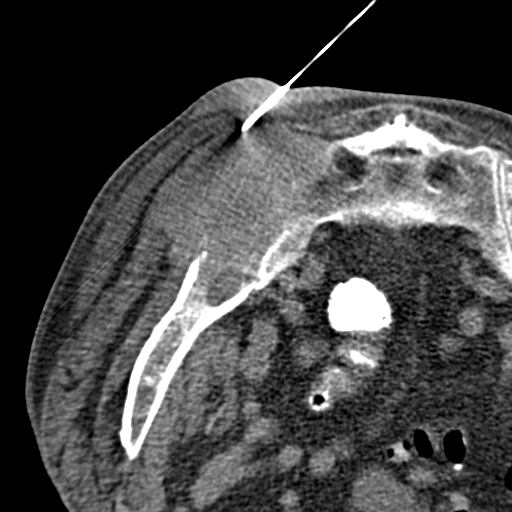
[im 4/12  soft-tissue]
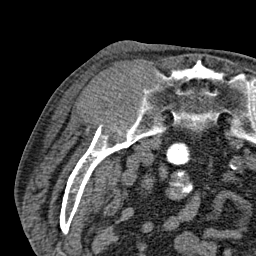
[im 5/12  soft-tissue]
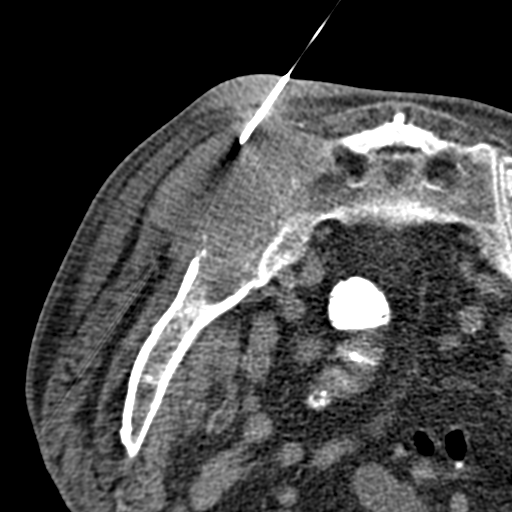
[im 6/12  soft-tissue]
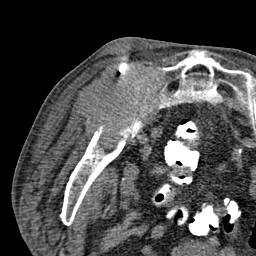
[im 7/12  soft-tissue]
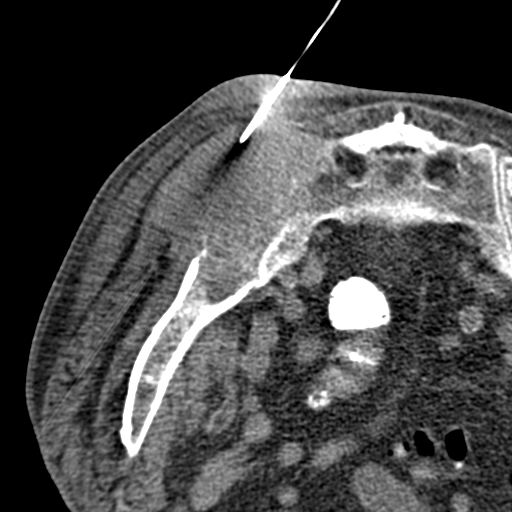
[im 8/12  soft-tissue]
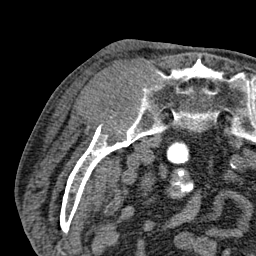
[im 9/12  soft-tissue]
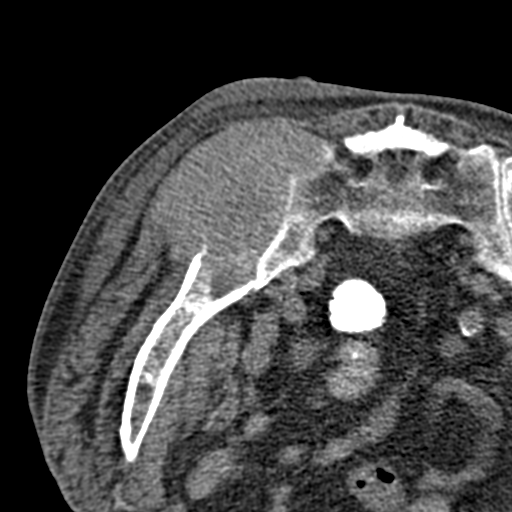
[im 9/12  lung]
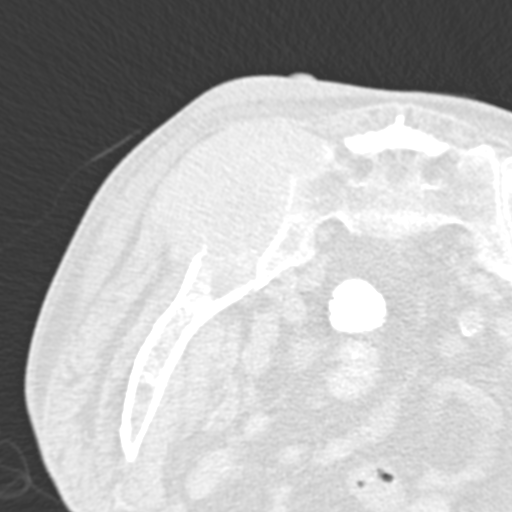
[im 10/12  soft-tissue]
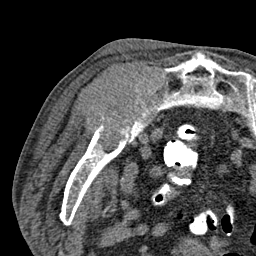
[im 10/12  lung]
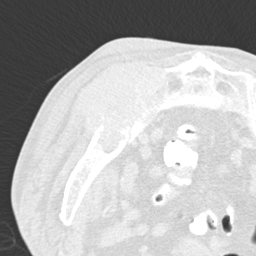
[im 10/12  bone]
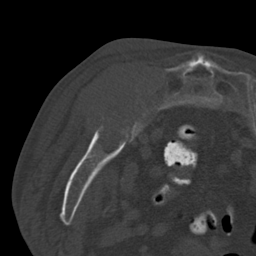
[im 11/12  soft-tissue]
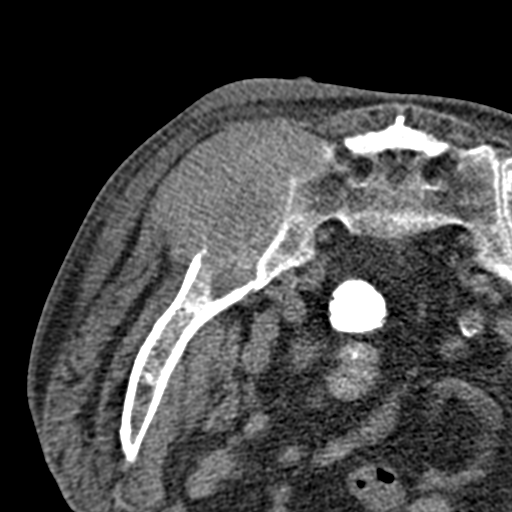
[im 11/12  lung]
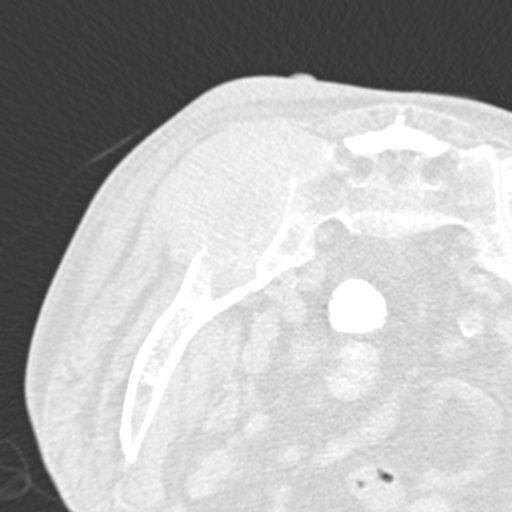

[10 of 12 positions shown; findings below may reference images not displayed]

EXAM:
CT GUIDED CORE BIOPSY OF RIGHT ILIAC BONE LESION

ANESTHESIA/SEDATION:
Intravenous Fentanyl and Versed were administered as conscious
sedation during continuous cardiorespiratory monitoring by the
radiology RN, with a total moderate sedation time of 4 minutes.

PROCEDURE:
The procedure risks, benefits, and alternatives were explained to
the patient. Questions regarding the procedure were encouraged and
answered. The patient understands and consents to the procedure.

Patient placed prone. Limited axial scans through the pelvis were
obtained and the lytic right iliac lesion was identified. An
appropriate skin entry site was determined and marked.

The operative field was prepped with Betadinein a sterile fashion,
and a sterile drape was applied covering the operative field. A
sterile gown and sterile gloves were used for the procedure. Local
anesthesia was provided with 1% Lidocaine.

Under CT fluoroscopic guidance, a 17 gauge trocar needle was
advanced to the margin of the lesion. Once needle tip position was
confirmed, coaxial 18-gauge core biopsy samples were obtained,
submitted in saline to surgical pathology. The guide needle was
removed.

Postprocedure scans show no hemorrhage or other apparent
complication. The patient tolerated the procedure well.

COMPLICATIONS:
None immediate
FINDINGS: Limited imaging confirms a large lytic lesion in the right iliac
bone at the level of the SI joint, with an exophytic soft tissue
component. Core biopsy samples were obtained as above.
IMPRESSION: 1. Technically successful CT-guided deep bone core biopsy of lytic
right iliac lesion.
